# Patient Record
Sex: Female | Born: 1945 | Race: Black or African American | Hispanic: No | Smoking: Never smoker
Health system: Southern US, Community
[De-identification: ages and names within clinical notes are randomized; demographics above are authoritative.]

## PROBLEM LIST (undated history)

## (undated) DIAGNOSIS — F419 Anxiety disorder, unspecified: Secondary | ICD-10-CM

## (undated) DIAGNOSIS — K219 Gastro-esophageal reflux disease without esophagitis: Secondary | ICD-10-CM

## (undated) DIAGNOSIS — G629 Polyneuropathy, unspecified: Secondary | ICD-10-CM

## (undated) DIAGNOSIS — H269 Unspecified cataract: Secondary | ICD-10-CM

## (undated) DIAGNOSIS — I10 Essential (primary) hypertension: Secondary | ICD-10-CM

## (undated) DIAGNOSIS — D649 Anemia, unspecified: Secondary | ICD-10-CM

## (undated) DIAGNOSIS — M1990 Unspecified osteoarthritis, unspecified site: Secondary | ICD-10-CM

## (undated) HISTORY — DX: Gastro-esophageal reflux disease without esophagitis: K21.9

## (undated) HISTORY — DX: Unspecified osteoarthritis, unspecified site: M19.90

## (undated) HISTORY — DX: Unspecified cataract: H26.9

## (undated) HISTORY — DX: Anxiety disorder, unspecified: F41.9

## (undated) HISTORY — DX: Anemia, unspecified: D64.9

## (undated) HISTORY — DX: Polyneuropathy, unspecified: G62.9

---

## 1973-12-27 HISTORY — PX: CHOLECYSTECTOMY: SHX55

## 1992-12-27 HISTORY — PX: ABDOMINAL HYSTERECTOMY: SHX81

## 1998-07-28 ENCOUNTER — Encounter: Admission: RE | Admit: 1998-07-28 | Discharge: 1998-07-28 | Payer: Self-pay | Admitting: *Deleted

## 2000-02-12 ENCOUNTER — Encounter: Payer: Self-pay | Admitting: *Deleted

## 2000-02-12 ENCOUNTER — Ambulatory Visit (HOSPITAL_COMMUNITY): Admission: RE | Admit: 2000-02-12 | Discharge: 2000-02-12 | Payer: Self-pay | Admitting: *Deleted

## 2001-03-09 ENCOUNTER — Other Ambulatory Visit: Admission: RE | Admit: 2001-03-09 | Discharge: 2001-03-09 | Payer: Self-pay | Admitting: *Deleted

## 2001-03-29 ENCOUNTER — Encounter: Payer: Self-pay | Admitting: Family Medicine

## 2001-03-29 ENCOUNTER — Encounter: Admission: RE | Admit: 2001-03-29 | Discharge: 2001-03-29 | Payer: Self-pay | Admitting: Family Medicine

## 2002-03-05 ENCOUNTER — Encounter: Admission: RE | Admit: 2002-03-05 | Discharge: 2002-03-05 | Payer: Self-pay | Admitting: *Deleted

## 2002-05-22 ENCOUNTER — Other Ambulatory Visit: Admission: RE | Admit: 2002-05-22 | Discharge: 2002-05-22 | Payer: Self-pay | Admitting: *Deleted

## 2002-05-25 ENCOUNTER — Encounter: Payer: Self-pay | Admitting: *Deleted

## 2002-05-25 ENCOUNTER — Ambulatory Visit (HOSPITAL_COMMUNITY): Admission: RE | Admit: 2002-05-25 | Discharge: 2002-05-25 | Payer: Self-pay | Admitting: *Deleted

## 2002-05-30 ENCOUNTER — Encounter: Admission: RE | Admit: 2002-05-30 | Discharge: 2002-05-30 | Payer: Self-pay | Admitting: *Deleted

## 2002-05-30 ENCOUNTER — Encounter: Payer: Self-pay | Admitting: *Deleted

## 2003-01-02 ENCOUNTER — Encounter: Payer: Self-pay | Admitting: *Deleted

## 2003-01-02 ENCOUNTER — Encounter: Admission: RE | Admit: 2003-01-02 | Discharge: 2003-01-02 | Payer: Self-pay | Admitting: *Deleted

## 2003-07-10 ENCOUNTER — Other Ambulatory Visit: Admission: RE | Admit: 2003-07-10 | Discharge: 2003-07-10 | Payer: Self-pay | Admitting: *Deleted

## 2004-08-03 ENCOUNTER — Other Ambulatory Visit: Admission: RE | Admit: 2004-08-03 | Discharge: 2004-08-03 | Payer: Self-pay | Admitting: *Deleted

## 2004-08-16 ENCOUNTER — Emergency Department (HOSPITAL_COMMUNITY): Admission: EM | Admit: 2004-08-16 | Discharge: 2004-08-16 | Payer: Self-pay | Admitting: *Deleted

## 2004-08-17 ENCOUNTER — Encounter: Admission: RE | Admit: 2004-08-17 | Discharge: 2004-08-17 | Payer: Self-pay | Admitting: *Deleted

## 2005-09-01 ENCOUNTER — Other Ambulatory Visit: Admission: RE | Admit: 2005-09-01 | Discharge: 2005-09-01 | Payer: Self-pay | Admitting: *Deleted

## 2005-09-01 ENCOUNTER — Ambulatory Visit (HOSPITAL_COMMUNITY): Admission: RE | Admit: 2005-09-01 | Discharge: 2005-09-01 | Payer: Self-pay | Admitting: *Deleted

## 2005-10-01 ENCOUNTER — Ambulatory Visit: Payer: Self-pay | Admitting: Family Medicine

## 2005-10-05 ENCOUNTER — Encounter: Admission: RE | Admit: 2005-10-05 | Discharge: 2005-10-05 | Payer: Self-pay | Admitting: Family Medicine

## 2005-10-19 ENCOUNTER — Ambulatory Visit: Payer: Self-pay | Admitting: Gastroenterology

## 2005-11-03 ENCOUNTER — Ambulatory Visit: Payer: Self-pay | Admitting: Gastroenterology

## 2005-12-17 ENCOUNTER — Encounter: Admission: RE | Admit: 2005-12-17 | Discharge: 2005-12-17 | Payer: Self-pay | Admitting: Neurology

## 2006-10-13 ENCOUNTER — Ambulatory Visit (HOSPITAL_COMMUNITY): Admission: RE | Admit: 2006-10-13 | Discharge: 2006-10-13 | Payer: Self-pay | Admitting: *Deleted

## 2006-10-13 ENCOUNTER — Ambulatory Visit: Payer: Self-pay | Admitting: Family Medicine

## 2006-11-08 ENCOUNTER — Other Ambulatory Visit: Admission: RE | Admit: 2006-11-08 | Discharge: 2006-11-08 | Payer: Self-pay | Admitting: *Deleted

## 2007-09-25 DIAGNOSIS — D649 Anemia, unspecified: Secondary | ICD-10-CM | POA: Insufficient documentation

## 2007-10-10 ENCOUNTER — Ambulatory Visit (HOSPITAL_COMMUNITY): Admission: RE | Admit: 2007-10-10 | Discharge: 2007-10-10 | Payer: Self-pay | Admitting: *Deleted

## 2007-11-15 ENCOUNTER — Other Ambulatory Visit: Admission: RE | Admit: 2007-11-15 | Discharge: 2007-11-15 | Payer: Self-pay | Admitting: *Deleted

## 2008-01-17 DIAGNOSIS — M542 Cervicalgia: Secondary | ICD-10-CM | POA: Insufficient documentation

## 2008-01-24 ENCOUNTER — Ambulatory Visit: Payer: Self-pay | Admitting: Family Medicine

## 2008-10-14 ENCOUNTER — Encounter: Admission: RE | Admit: 2008-10-14 | Discharge: 2008-10-14 | Payer: Self-pay | Admitting: Gynecology

## 2008-12-09 ENCOUNTER — Ambulatory Visit: Payer: Self-pay | Admitting: Family Medicine

## 2008-12-09 DIAGNOSIS — R300 Dysuria: Secondary | ICD-10-CM | POA: Insufficient documentation

## 2008-12-09 LAB — CONVERTED CEMR LAB
Bilirubin Urine: NEGATIVE
Blood in Urine, dipstick: NEGATIVE
Glucose, Urine, Semiquant: NEGATIVE
Ketones, urine, test strip: NEGATIVE
Nitrite: NEGATIVE
Protein, U semiquant: NEGATIVE
Specific Gravity, Urine: 1.025
Urobilinogen, UA: 0.2
WBC Urine, dipstick: NEGATIVE
pH: 6

## 2008-12-12 ENCOUNTER — Telehealth: Payer: Self-pay | Admitting: *Deleted

## 2008-12-12 LAB — CONVERTED CEMR LAB
ALT: 20 units/L (ref 0–35)
AST: 27 units/L (ref 0–37)
Albumin: 3.9 g/dL (ref 3.5–5.2)
Alkaline Phosphatase: 81 units/L (ref 39–117)
BUN: 16 mg/dL (ref 6–23)
Basophils Absolute: 0.1 10*3/uL (ref 0.0–0.1)
Basophils Relative: 0.8 % (ref 0.0–3.0)
Bilirubin, Direct: 0.1 mg/dL (ref 0.0–0.3)
CO2: 31 meq/L (ref 19–32)
Calcium: 9.6 mg/dL (ref 8.4–10.5)
Chloride: 104 meq/L (ref 96–112)
Cholesterol: 202 mg/dL (ref 0–200)
Creatinine, Ser: 0.7 mg/dL (ref 0.4–1.2)
Direct LDL: 123.5 mg/dL
Eosinophils Absolute: 0 10*3/uL (ref 0.0–0.7)
Eosinophils Relative: 0.3 % (ref 0.0–5.0)
GFR calc Af Amer: 109 mL/min
GFR calc non Af Amer: 90 mL/min
Glucose, Bld: 54 mg/dL — ABNORMAL LOW (ref 70–99)
HCT: 33.2 % — ABNORMAL LOW (ref 36.0–46.0)
HDL: 66.9 mg/dL (ref >39.0)
Hemoglobin: 11.3 g/dL — ABNORMAL LOW (ref 12.0–15.0)
Lymphocytes Relative: 23.8 % (ref 12.0–46.0)
MCHC: 34.1 g/dL (ref 30.0–36.0)
MCV: 72 fL — ABNORMAL LOW (ref 78.0–100.0)
Monocytes Absolute: 0.3 10*3/uL (ref 0.1–1.0)
Monocytes Relative: 5.1 % (ref 3.0–12.0)
Neutro Abs: 4.8 10*3/uL (ref 1.4–7.7)
Neutrophils Relative %: 70 % (ref 43.0–77.0)
Platelets: 252 10*3/uL (ref 150–400)
Potassium: 3.5 meq/L (ref 3.5–5.1)
RBC: 4.61 M/uL (ref 3.87–5.11)
RDW: 14.4 % (ref 11.5–14.6)
Sodium: 142 meq/L (ref 135–145)
TSH: 0.87 microintl units/mL (ref 0.35–5.50)
Total Bilirubin: 0.8 mg/dL (ref 0.3–1.2)
Total CHOL/HDL Ratio: 3
Total Protein: 7.2 g/dL (ref 6.0–8.3)
Triglycerides: 41 mg/dL (ref 0–149)
VLDL: 8 mg/dL (ref 0–40)
WBC: 6.8 10*3/uL (ref 4.5–10.5)

## 2009-11-14 ENCOUNTER — Encounter: Admission: RE | Admit: 2009-11-14 | Discharge: 2009-11-14 | Payer: Self-pay | Admitting: Family Medicine

## 2009-12-10 ENCOUNTER — Ambulatory Visit: Payer: Self-pay | Admitting: Family Medicine

## 2009-12-10 LAB — CONVERTED CEMR LAB
ALT: 17 units/L (ref 0–35)
AST: 23 units/L (ref 0–37)
Albumin: 4 g/dL (ref 3.5–5.2)
Alkaline Phosphatase: 83 units/L (ref 39–117)
BUN: 6 mg/dL (ref 6–23)
Basophils Absolute: 0 10*3/uL (ref 0.0–0.1)
Basophils Relative: 0.5 % (ref 0.0–3.0)
Bilirubin Urine: NEGATIVE
Bilirubin, Direct: 0 mg/dL (ref 0.0–0.3)
Blood in Urine, dipstick: NEGATIVE
CO2: 30 meq/L (ref 19–32)
Calcium: 9.6 mg/dL (ref 8.4–10.5)
Chloride: 104 meq/L (ref 96–112)
Cholesterol: 186 mg/dL (ref 0–200)
Creatinine, Ser: 0.8 mg/dL (ref 0.4–1.2)
Eosinophils Absolute: 0 10*3/uL (ref 0.0–0.7)
Eosinophils Relative: 0.6 % (ref 0.0–5.0)
GFR calc non Af Amer: 93.47 mL/min (ref >60)
Glucose, Bld: 80 mg/dL (ref 70–99)
Glucose, Urine, Semiquant: NEGATIVE
HCT: 34.4 % — ABNORMAL LOW (ref 36.0–46.0)
HDL: 58.6 mg/dL (ref >39.00)
Hemoglobin: 11.3 g/dL — ABNORMAL LOW (ref 12.0–15.0)
Ketones, urine, test strip: NEGATIVE
LDL Cholesterol: 115 mg/dL — ABNORMAL HIGH (ref 0–99)
Lymphocytes Relative: 32.8 % (ref 12.0–46.0)
Lymphs Abs: 1.9 10*3/uL (ref 0.7–4.0)
MCHC: 33 g/dL (ref 30.0–36.0)
MCV: 75.3 fL — ABNORMAL LOW (ref 78.0–100.0)
Monocytes Absolute: 0.4 10*3/uL (ref 0.1–1.0)
Monocytes Relative: 7.7 % (ref 3.0–12.0)
Neutro Abs: 3.5 10*3/uL (ref 1.4–7.7)
Neutrophils Relative %: 58.4 % (ref 43.0–77.0)
Nitrite: NEGATIVE
Platelets: 241 10*3/uL (ref 150.0–400.0)
Potassium: 4.1 meq/L (ref 3.5–5.1)
Protein, U semiquant: NEGATIVE
RBC: 4.57 M/uL (ref 3.87–5.11)
RDW: 14.6 % (ref 11.5–14.6)
Sodium: 139 meq/L (ref 135–145)
Specific Gravity, Urine: 1.01
TSH: 1.63 microintl units/mL (ref 0.35–5.50)
Total Bilirubin: 0.9 mg/dL (ref 0.3–1.2)
Total CHOL/HDL Ratio: 3
Total Protein: 7.4 g/dL (ref 6.0–8.3)
Triglycerides: 61 mg/dL (ref 0.0–149.0)
Urobilinogen, UA: 0.2
VLDL: 12.2 mg/dL (ref 0.0–40.0)
WBC Urine, dipstick: NEGATIVE
WBC: 5.8 10*3/uL (ref 4.5–10.5)
pH: 6

## 2009-12-17 ENCOUNTER — Ambulatory Visit: Payer: Self-pay | Admitting: Family Medicine

## 2010-04-27 ENCOUNTER — Ambulatory Visit: Payer: Self-pay | Admitting: Family Medicine

## 2010-04-27 DIAGNOSIS — H908 Mixed conductive and sensorineural hearing loss, unspecified: Secondary | ICD-10-CM | POA: Insufficient documentation

## 2010-05-18 ENCOUNTER — Ambulatory Visit: Payer: Self-pay | Admitting: Family Medicine

## 2010-06-03 ENCOUNTER — Telehealth: Payer: Self-pay | Admitting: Family Medicine

## 2010-06-18 ENCOUNTER — Telehealth: Payer: Self-pay | Admitting: Family Medicine

## 2010-06-18 ENCOUNTER — Encounter: Payer: Self-pay | Admitting: Family Medicine

## 2010-07-02 ENCOUNTER — Telehealth: Payer: Self-pay | Admitting: Family Medicine

## 2010-07-14 ENCOUNTER — Encounter: Admission: RE | Admit: 2010-07-14 | Discharge: 2010-07-14 | Payer: Self-pay | Admitting: Neurosurgery

## 2010-11-25 ENCOUNTER — Encounter: Admission: RE | Admit: 2010-11-25 | Discharge: 2010-11-25 | Payer: Self-pay | Admitting: Family Medicine

## 2010-12-10 ENCOUNTER — Ambulatory Visit: Payer: Self-pay | Admitting: Family Medicine

## 2010-12-10 LAB — CONVERTED CEMR LAB
ALT: 17 units/L (ref 0–35)
AST: 23 units/L (ref 0–37)
Albumin: 4.2 g/dL (ref 3.5–5.2)
Alkaline Phosphatase: 82 units/L (ref 39–117)
BUN: 15 mg/dL (ref 6–23)
Basophils Absolute: 0 10*3/uL (ref 0.0–0.1)
Basophils Relative: 0.5 % (ref 0.0–3.0)
Bilirubin Urine: NEGATIVE
Bilirubin, Direct: 0.1 mg/dL (ref 0.0–0.3)
Blood in Urine, dipstick: NEGATIVE
CO2: 29 meq/L (ref 19–32)
Calcium: 9.6 mg/dL (ref 8.4–10.5)
Chloride: 102 meq/L (ref 96–112)
Cholesterol: 213 mg/dL — ABNORMAL HIGH (ref 0–200)
Creatinine, Ser: 0.7 mg/dL (ref 0.4–1.2)
Direct LDL: 139.4 mg/dL
Eosinophils Absolute: 0 10*3/uL (ref 0.0–0.7)
Eosinophils Relative: 0.5 % (ref 0.0–5.0)
GFR calc non Af Amer: 110.5 mL/min (ref >60.00)
Glucose, Bld: 77 mg/dL (ref 70–99)
Glucose, Urine, Semiquant: NEGATIVE
HCT: 33.7 % — ABNORMAL LOW (ref 36.0–46.0)
HDL: 59.3 mg/dL (ref >39.00)
Hemoglobin: 11 g/dL — ABNORMAL LOW (ref 12.0–15.0)
Ketones, urine, test strip: NEGATIVE
Lymphocytes Relative: 34.2 % (ref 12.0–46.0)
Lymphs Abs: 1.8 10*3/uL (ref 0.7–4.0)
MCHC: 32.6 g/dL (ref 30.0–36.0)
MCV: 73.9 fL — ABNORMAL LOW (ref 78.0–100.0)
Monocytes Absolute: 0.3 10*3/uL (ref 0.1–1.0)
Monocytes Relative: 6.1 % (ref 3.0–12.0)
Neutro Abs: 3.1 10*3/uL (ref 1.4–7.7)
Neutrophils Relative %: 58.7 % (ref 43.0–77.0)
Nitrite: NEGATIVE
Platelets: 256 10*3/uL (ref 150.0–400.0)
Potassium: 3.8 meq/L (ref 3.5–5.1)
Protein, U semiquant: NEGATIVE
RBC: 4.57 M/uL (ref 3.87–5.11)
RDW: 16 % — ABNORMAL HIGH (ref 11.5–14.6)
Sodium: 140 meq/L (ref 135–145)
Specific Gravity, Urine: 1.025
TSH: 1.74 microintl units/mL (ref 0.35–5.50)
Total Bilirubin: 0.9 mg/dL (ref 0.3–1.2)
Total CHOL/HDL Ratio: 4
Total Protein: 7.3 g/dL (ref 6.0–8.3)
Triglycerides: 43 mg/dL (ref 0.0–149.0)
Urobilinogen, UA: 0.2
VLDL: 8.6 mg/dL (ref 0.0–40.0)
WBC Urine, dipstick: NEGATIVE
WBC: 5.3 10*3/uL (ref 4.5–10.5)
pH: 5.5

## 2010-12-16 ENCOUNTER — Encounter: Payer: Self-pay | Admitting: Family Medicine

## 2010-12-17 ENCOUNTER — Ambulatory Visit: Payer: Self-pay | Admitting: Family Medicine

## 2011-01-26 NOTE — Miscellaneous (Signed)
Summary: Orders Update  Clinical Lists Changes  Orders: Added new Referral order of Neurosurgeon Referral (Neurosurgeon) - Signed 

## 2011-01-26 NOTE — Assessment & Plan Note (Signed)
Summary: NECK/EAR PAIN/CCM   Vital Signs:  Patient Profile:   65 Years Old Female Height:     62 inches (157.48 cm) Weight:      129 pounds (58.64 kg) BMI:     23.68 Temp:     98.2 degrees F (36.78 degrees C) oral Pulse rate:   69 / minute BP sitting:   148 / 82  (left arm)  Pt. in pain?   yes    Location:   head/neck    Intensity:   10  Vitals Entered By: Arcola Jansky, RN (January 24, 2008 11:34 AM)                  Chief Complaint:  neck and ear pain, "cold cough, ear sore, and sore throat-during Christmas;now having severe shoulder & neck pain that radiates to arm".  History of Present Illness: Deborah Barton is a 65 year old female, who comes in today for evaluation and of two problems.  Problem one.  A week ago unprovoked she developed pain in the left side of her neck.  She points to the left posterior cervical muscles as the source for her pain.  She says the discomfort is up in her head and down to her shoulder.  She denies any history of trauma.  She describes the pain as, constant, it's dull on a scale of one to 10 and 6.  She has no trouble with her vision or hearing.  No nausea, vomiting, diarrhea, in the neurologic symptoms.  Again, she denies any history of trauma.  Second problem is left earache.  She had a cold two weeks ago.  It resolved, but she still has pain in her left ear.  She says the pain comes and goes.  Acute Visit History:      She denies abdominal pain, chest pain, constipation, cough, diarrhea, earache, eye symptoms, fever, genitourinary symptoms, headache, musculoskeletal symptoms, nasal discharge, nausea, rash, sinus problems, sore throat, and vomiting.         Current Allergies (reviewed today): No known allergies   Past Medical History:    Reviewed history from 09/25/2007 and no changes required:       Anemia-NOS       childbirth x 2       TAH/BSO       cholecystectomy   Family History:    Reviewed history from 09/25/2007 and  no changes required:       Family History of Anemia/FE deficiency       Family History of Arthritis       Family History of Asthma       Family History Depression       Family History Hypertension       Family History of Stroke M 1st degree relative <50  Social History:    Reviewed history from 09/25/2007 and no changes required:       Occupation: retail       Divorced       Never Smoked       Alcohol use-no    Review of Systems      See HPI   Physical Exam  General:     Well-developed,well-nourished,in no acute distress; alert,appropriate and cooperative throughout examination Head:     Normocephalic and atraumatic without obvious abnormalities. No apparent alopecia or balding. Eyes:     No corneal or conjunctival inflammation noted. EOMI. Perrla. Funduscopic exam benign, without hemorrhages, exudates or papilledema. Vision grossly normal. Ears:  External ear exam shows no significant lesions or deformities.  Otoscopic examination reveals clear canals, tympanic membranes are intact bilaterally without bulging, retraction, inflammation or discharge. Hearing is grossly normal bilaterally. Nose:     External nasal examination shows no deformity or inflammation. Nasal mucosa are pink and moist without lesions or exudates. Mouth:     Oral mucosa and oropharynx without lesions or exudates.  Teeth in good repair. Neck:     No deformities, masses, or tenderness noted. Msk:     No deformity or scoliosis noted of thoracic or lumbar spine.   Pulses:     R and L carotid,radial,femoral,dorsalis pedis and posterior tibial pulses are full and equal bilaterally Extremities:     No clubbing, cyanosis, edema, or deformity noted with normal full range of motion of all joints.   Neurologic:     No cranial nerve deficits noted. Station and gait are normal. Plantar reflexes are down-going bilaterally. DTRs are symmetrical throughout. Sensory, motor and coordinative functions appear  intact.    Impression & Recommendations:  Problem # 1:  NECK PAIN, LEFT (ICD-723.1) Assessment: New  Her updated medication list for this problem includes:    Flexeril 10 Mg Tabs (Cyclobenzaprine hcl) .Marland Kitchen... 1 tab @ bedtime    Vicodin Es 7.5-750 Mg Tabs (Hydrocodone-acetaminophen) .Marland Kitchen... 1 tab @ bedtime   Complete Medication List: 1)  Flexeril 10 Mg Tabs (Cyclobenzaprine hcl) .Marland Kitchen.. 1 tab @ bedtime 2)  Vicodin Es 7.5-750 Mg Tabs (Hydrocodone-acetaminophen) .Marland Kitchen.. 1 tab @ bedtime   Patient Instructions: 1)  begin Motrin 600 mg 3 times a day with food.  I will write her a prescription for Flexeril one half tablet at bedtime, along with a half a tablet of Vicodin as needed for severe nighttime pain.  Go to the surgical supply, place, and purchase a soft cervical collar.  But the collar on at that time and wear it all night.  If your symptoms do not resolve in two to 3 weeks let us know.  We will get her set up for physical therapy. 2)  Please set up for a physical exam this winter. 3)  Please return for lab work one (1) week before your next appointment.  CBC, Bmet, lipid panel, urinalysis, TSH level, hepatic panel, prior to next visit (v70.0)     Prescriptions: VICODIN ES 7.5-750 MG  TABS (HYDROCODONE-ACETAMINOPHEN) 1 tab @ bedtime  #30 x 1   Entered and Authorized by:   Roderick Pee MD   Signed by:   Roderick Pee MD on 01/24/2008   Method used:   Print then Give to Patient   RxID:   [id number] FLEXERIL 10 MG  TABS (CYCLOBENZAPRINE HCL) 1 tab @ bedtime  #30 x 1   Entered and Authorized by:   Roderick Pee MD   Signed by:   Roderick Pee MD on 01/24/2008   Method used:   Print then Give to Patient   RxID:   [id number]  ]

## 2011-01-26 NOTE — Assessment & Plan Note (Signed)
Summary: cpx patient fasting/mhf   Vital Signs:  Patient Profile:   65 Years Old Female Height:     62 inches (157.48 cm) Weight:      129 pounds Temp:     98.3 degrees F oral BP sitting:   140 / 90  (left arm) Cuff size:   regular  Vitals Entered By: Kern Reap CMA (December 09, 2008 10:43 AM)                 Chief Complaint:  cpx.  History of Present Illness: Deborah Barton is a 65 year old female, who comes in today for evaluation of multiple issues.  She was last seen here 2006 at that time.  She was complaining of pain in the left side of her head and numbness in her left arm and leg.  She underwent a complete evaluation by me which was negative.  She then went to neurology and had a complete workup.  MRI brain scan was normal.  Neurologic evaluation was normal.  Since that, time.  She's continued to have symp.  She states she takes 4 mg of Motrin at bedtime, and this helps alleviate her symptoms.  Neurologic review of systems negative.  She is postmenopausal.  She is up on health maintenance.  Activities.  Last tetanus shot unknown.  Will give her a booster today.  Also, recommended both flu shots, which she declines.  She also complains of some low back pain and dysuria.  However.  Urine is normal.  Two uterus and ovaries removed for nonmalignant reasons  colonoscopy o 6, normal.    Current Allergies: No known allergies   Past Medical History:    Reviewed history from 01/24/2008 and no changes required:       Anemia-NOS       childbirth x 2       TAH/BSO       cholecystectomy       intermittent numbness left side of face, arm, and leg, etiology unknown   Family History:    Reviewed history from 09/25/2007 and no changes required:       Family History of Anemia/FE deficiency       Family History of Arthritis       Family History of Asthma       Family History Depression       Family History Hypertension       Family History of Stroke M 1st degree relative  <50  Social History:    Reviewed history from 01/24/2008 and no changes required:       Occupation: retail       Divorced       Never Smoked       Alcohol use-no    Review of Systems      See HPI   Physical Exam  General:     Well-developed,well-nourished,in no acute distress; alert,appropriate and cooperative throughout examination Head:     Normocephalic and atraumatic without obvious abnormalities. No apparent alopecia or balding. Eyes:     No corneal or conjunctival inflammation noted. EOMI. Perrla. Funduscopic exam benign, without hemorrhages, exudates or papilledema. Vision grossly normal. Ears:     External ear exam shows no significant lesions or deformities.  Otoscopic examination reveals clear canals, tympanic membranes are intact bilaterally without bulging, retraction, inflammation or discharge. Hearing is grossly normal bilaterally. Nose:     External nasal examination shows no deformity or inflammation. Nasal mucosa are pink and moist without lesions or exudates. Mouth:  Oral mucosa and oropharynx without lesions or exudates.  Teeth in good repair. Neck:     No deformities, masses, or tenderness noted. Chest Wall:     No deformities, masses, or tenderness noted. Breasts:     No mass, nodules, thickening, tenderness, bulging, retraction, inflamation, nipple discharge or skin changes noted.   Lungs:     Normal respiratory effort, chest expands symmetrically. Lungs are clear to auscultation, no crackles or wheezes. Heart:     Normal rate and regular rhythm. S1 and S2 normal without gallop, murmur, click, rub or other extra sounds. Abdomen:     Bowel sounds positive,abdomen soft and non-tender without masses, organomegaly or hernias noted. Rectal:     No external abnormalities noted. Normal sphincter tone. No rectal masses or tenderness. Genitalia:     Pelvic Exam:        External: normal female genitalia without lesions or masses        Vagina: normal  without lesions or masses        Cervix: normal without lesions or masses        Adnexa: normal bimanual exam without masses or fullness  dry vagina        Uterus: normal by palpation        Pap smear: not performed Msk:     No deformity or scoliosis noted of thoracic or lumbar spine.   Pulses:     R and L carotid,radial,femoral,dorsalis pedis and posterior tibial pulses are full and equal bilaterally Extremities:     No clubbing, cyanosis, edema, or deformity noted with normal full range of motion of all joints.   Neurologic:     No cranial nerve deficits noted. Station and gait are normal. Plantar reflexes are down-going bilaterally. DTRs are symmetrical throughout. Sensory, motor and coordinative functions appear intact. Skin:     Intact without suspicious lesions or rashes Cervical Nodes:     No lymphadenopathy noted Axillary Nodes:     No palpable lymphadenopathy Inguinal Nodes:     No significant adenopathy Psych:     Cognition and judgment appear intact. Alert and cooperative with normal attention span and concentration. No apparent delusions, illusions, hallucinations    Impression & Recommendations:  Problem # 1:  NECK PAIN, LEFT (ICD-723.1) Assessment: Unchanged  Her updated medication list for this problem includes:    Flexeril 10 Mg Tabs (Cyclobenzaprine hcl) .Marland Kitchen... 1 tab @ bedtime    Vicodin Es 7.5-750 Mg Tabs (Hydrocodone-acetaminophen) .Marland Kitchen... 1 tab @ bedtime    Ibuprofen 200 Mg Tabs (Ibuprofen) .Marland Kitchen... Take one once daily  Orders: Venipuncture (16109) TLB-Lipid Panel (80061-LIPID) TLB-BMP (Basic Metabolic Panel-BMET) (80048-METABOL) TLB-CBC Platelet - w/Differential (85025-CBCD) TLB-Hepatic/Liver Function Pnl (80076-HEPATIC) TLB-TSH (Thyroid Stimulating Hormone) (84443-TSH)   Problem # 2:  DYSURIA (ICD-788.1) Assessment: New  Her updated medication list for this problem includes:    Metronidazole 500 Mg Tabs (Metronidazole) .Marland Kitchen... Take one tablet at  bedtime  Orders: UA Dipstick w/o Micro (manual) (60454) Venipuncture (09811) TLB-Lipid Panel (80061-LIPID) TLB-BMP (Basic Metabolic Panel-BMET) (80048-METABOL) TLB-CBC Platelet - w/Differential (85025-CBCD) TLB-Hepatic/Liver Function Pnl (80076-HEPATIC) TLB-TSH (Thyroid Stimulating Hormone) (84443-TSH)   Complete Medication List: 1)  Flexeril 10 Mg Tabs (Cyclobenzaprine hcl) .Marland Kitchen.. 1 tab @ bedtime 2)  Vicodin Es 7.5-750 Mg Tabs (Hydrocodone-acetaminophen) .Marland Kitchen.. 1 tab @ bedtime 3)  Ibuprofen 200 Mg Tabs (Ibuprofen) .... Take one once daily 4)  Metronidazole 500 Mg Tabs (Metronidazole) .... Take one tablet at bedtime 5)  Lidex 0.05 % Crea (Fluocinonide) .... Apply to  area 3 times once daily as needed 6)  Premarin 0.625 Mg/gm Crea (Estrogens, conjugated) .... Apply 2 x week  Other Orders: EKG w/ Interpretation (93000) Tdap => 16yrs IM (16109) Admin 1st Vaccine (60454)   Patient Instructions: 1)  Please schedule a follow-up appointment in 1 year. 2)  It is important that you exercise regularly at least 20 minutes 5 times a week. If you develop chest pain, have severe difficulty breathing, or feel very tired , stop exercising immediately and seek medical attention. 3)  Schedule your mammogram. 4)  Schedule a colonoscopy/sigmoidoscopy to help detect colon cancer. 5)  Take an Aspirin every day.   Prescriptions: PREMARIN 0.625 MG/GM CREA (ESTROGENS, CONJUGATED) apply 2 x week  #3 tubes x 10   Entered and Authorized by:   Roderick Pee MD   Signed by:   Roderick Pee MD on 12/09/2008   Method used:   Print then Give to Patient   RxID:   [id number]  ]  Tetanus/Td Vaccine    Vaccine Type: Tdap    Site: right deltoid    Mfr: Sanofi Pasteur    Dose: 0.5 ml    Route: IM    Given by: Kern Reap CMA    Exp. Date: 11/28/2010    Lot #: [id number]   Laboratory Results   Urine Tests   Date/Time Reported: December 09, 2008 10:59 AM   Routine Urinalysis   Color:  yellow Appearance: Clear Glucose: negative   (Normal Range: Negative) Bilirubin: negative   (Normal Range: Negative) Ketone: negative   (Normal Range: Negative) Spec. Gravity: 1.025   (Normal Range: 1.003-1.035) Blood: negative   (Normal Range: Negative) pH: 6.0   (Normal Range: 5.0-8.0) Protein: negative   (Normal Range: Negative) Urobilinogen: 0.2   (Normal Range: 0-1) Nitrite: negative   (Normal Range: Negative) Leukocyte Esterace: negative   (Normal Range: Negative)    Comments: Kern Reap CMA  December 09, 2008 11:00 AM

## 2011-01-26 NOTE — Progress Notes (Signed)
Summary: test results  Phone Note Call from Patient Call back at [phone]   Caller: vm Call For: Deborah Barton Summary of Call: Returning your call about my test results. Initial call taken by: Rudy Jew, RN,  December 12, 2008 9:25 AM  Follow-up for Phone Call        labs normal Follow-up by: Roderick Pee MD,  December 12, 2008 12:21 PM  Additional Follow-up for Phone Call Additional follow up Details #1::        already spoke with pt Additional Follow-up by: Kern Reap CMA,  December 12, 2008 12:46 PM
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
## 2011-01-26 NOTE — Assessment & Plan Note (Signed)
Summary: ear pain/dm   Vital Signs:  Patient profile:   65 year old female Menstrual status:  hysterectomy Temp:     98.0 degrees F oral BP sitting:   124 / 80  (left arm) Cuff size:   regular  Vitals Entered By: Kern Reap CMA Duncan Dull) (Apr 27, 2010 3:32 PM) CC: left ear pain, head and neck pain   CC:  left ear pain and head and neck pain.  History of Present Illness: Deborah Barton is a 65 year old female comes in today for evaluation of two problems.  She's been using ear wax dissolving drops to get the ear wax out of her left ear canal, but can't get the wax out.  Also, she said left-sided neck pain for about two weeks.  No history of trauma.  She describes her neck pain is constant, dull, a 3 or 4 on a scale of one to 10.  No radiation.  No history of trauma.  Neurologic review of systems negative  Allergies: No Known Drug Allergies  Past History:  Past medical, surgical, family and social histories (including risk factors) reviewed for relevance to current acute and chronic problems.  Past Medical History: Reviewed history from 12/09/2008 and no changes required. Anemia-NOS childbirth x 2 TAH/BSO cholecystectomy intermittent numbness left side of face, arm, and leg, etiology unknown  Past Surgical History: Reviewed history from 09/25/2007 and no changes required. CB x2 TAH/BSO Cholecystectomy  Family History: Reviewed history from 09/25/2007 and no changes required. Family History of Anemia/FE deficiency Family History of Arthritis Family History of Asthma Family History Depression Family History Hypertension Family History of Stroke M 1st degree relative <50  Social History: Reviewed history from 01/24/2008 and no changes required. 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 No apparent alopecia or balding. Eyes:  No corneal or conjunctival inflammation noted. EOMI. Perrla. Funduscopic exam benign, without hemorrhages, exudates or papilledema. Vision grossly normal. Ears:  right ear canal normal left ear canal shows cerumen impaction relieved by suction by me Msk:  No deformity or scoliosis noted of thoracic or lumbar spine.   Pulses:  R and L carotid,radial,femoral,dorsalis pedis and posterior tibial pulses are full and equal bilaterally Extremities:  No clubbing, cyanosis, edema, or deformity noted with normal full range of motion of all joints.   Neurologic:  No cranial nerve deficits noted. Station and gait are normal. Plantar reflexes are down-going bilaterally. DTRs are symmetrical throughout. Sensory, motor and coordinative functions appear intact.   Impression & Recommendations:  Problem # 1:  NECK PAIN, LEFT (ICD-723.1) Assessment Deteriorated  Her updated medication list for this problem includes:    Flexeril 10 Mg Tabs (Cyclobenzaprine hcl) .Marland Kitchen... 1 tab @ bedtime    Vicodin Es 7.5-750 Mg Tabs (Hydrocodone-acetaminophen) .Marland Kitchen... 1 tab @ bedtime    Ibuprofen 200 Mg Tabs (Ibuprofen) .Marland Kitchen... 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 1 tab @ bedtime 2)  Vicodin Es 7.5-750 Mg Tabs (Hydrocodone-acetaminophen) .Marland Kitchen.. 1 tab @ bedtime 3)  Ibuprofen 200 Mg Tabs (Ibuprofen) .... Take one once daily 4)  Lidex 0.05 % Crea (Fluocinonide) .... Apply to area 3 times once daily as needed 5)  Premarin 0.625 Mg/gm  Crea (Estrogens, conjugated) .... Apply 2 x week  Patient Instructions: 1)  take 600 mg of Motrin 3 times a day with food for the soreness in her neck.  Return p.r.n.

## 2011-01-26 NOTE — Progress Notes (Signed)
Summary: MRI neck and left shoulder request  Phone Note Call from Patient Call back at [phone]   Caller: Patient Call For: Roderick Pee MD Summary of Call: Pt requesting MRI of her left side neck/shoulder.  PT is not helping and she is still in alot of pain.  Follow-up for Phone Call        [place]......... please call....... if the treatment program is not working, then we need to get her set up for a neurosurgical consult ASAP Follow-up by: Roderick Pee MD,  June 18, 2010 10:08 AM  Additional Follow-up for Phone Call Additional follow up Details #1::        Phone Call Completed Additional Follow-up by: Kern Reap CMA Duncan Dull),  June 18, 2010 11:00 AM

## 2011-01-26 NOTE — Progress Notes (Signed)
  Phone Note Call from Patient   Caller: Patient Call For: Roderick Pee MD Summary of Call: Pt would [facility] Ongoing neck pain and is going to PT. [phone] Initial call taken by: Lynann Beaver CMA,  June 03, 2010 10:08 AM  Follow-up for Phone Call        ok.........Marland KitchenFlexeril 5 mg, dispensed 30 tablets, directions one nightly p.r.n. refills x 1 Follow-up by: Roderick Pee MD,  June 03, 2010 10:24 AM    New/Updated Medications: FLEXERIL 5 MG TABS (CYCLOBENZAPRINE HCL) one by mouth q hs Prescriptions: FLEXERIL 5 MG TABS (CYCLOBENZAPRINE HCL) one by mouth q hs  #30 x 1   Entered by:   Lynann Beaver CMA   Authorized by:   Roderick Pee MD   Signed by:   Lynann Beaver CMA on 06/03/2010   Method used:   [facility]        [facility]  [address] [id number]       [address]       [place], [place]  [address]       Ph: [phone] or [phone]       Fax: [phone]   RxID:   [id number]

## 2011-01-26 NOTE — Assessment & Plan Note (Signed)
Summary: L SIDED HEAD DISCOMFORT / F/U FROM EAR IRRIGATION // RS   Vital Signs:  Patient profile:   65 year old female Menstrual status:  hysterectomy Weight:      131 pounds Temp:     98.4 degrees F oral BP sitting:   120 / 80  (left arm) Cuff size:   regular  Vitals Entered By: Kern Reap CMA Duncan Dull) (May 18, 2010 12:08 PM) CC: left side of neck pain Is Patient Diabetic? No Pain Assessment Patient in pain? yes        CC:  left side of neck pain.  History of Present Illness: Deborah Barton is a 65 year old female, who comes in today for evaluation of pain in the left side of her neck x 4 weeks.  We saw her a couple weeks ago with this problem.  Neurologic exam was negative.  We recommended Motrin 600 mg b.i.d..  She's done that, but the discomfort will not go away.  She describes as dull, constant, a 4 on a scale of one to 10.  It does not radiate.  It is confined to the left side of her neck.  Neurologic review of systems negative.  Allergies: No Known Drug Allergies  Review of Systems      See HPI  Physical Exam  General:  Well-developed,well-nourished,in no acute distress; alert,appropriate and cooperative throughout examination Head:  Normocephalic and atraumatic without obvious abnormalities. No apparent alopecia or balding. Eyes:  No corneal or conjunctival inflammation noted. EOMI. Perrla. Funduscopic exam benign, without hemorrhages, exudates or papilledema. Vision grossly normal. Ears:  External ear exam shows no significant lesions or deformities.  Otoscopic examination reveals clear canals, tympanic membranes are intact bilaterally without bulging, retraction, inflammation or discharge. Hearing is grossly normal bilaterally. Nose:  External nasal examination shows no deformity or inflammation. Nasal mucosa are pink and moist without lesions or exudates. Mouth:  Oral mucosa and oropharynx without lesions or exudates.  Teeth in good repair. Neck:  No deformities,  masses, or tenderness noted. Msk:  No deformity or scoliosis noted of thoracic or lumbar spine.   Pulses:  R and L carotid,radial,femoral,dorsalis pedis and posterior tibial pulses are full and equal bilaterally Extremities:  No clubbing, cyanosis, edema, or deformity noted with normal full range of motion of all joints.   Neurologic:  No cranial nerve deficits noted. Station and gait are normal. Plantar reflexes are down-going bilaterally. DTRs are symmetrical throughout. Sensory, motor and coordinative functions appear intact.   Impression & Recommendations:  Problem # 1:  NECK PAIN, LEFT (ICD-723.1) Assessment Unchanged  Her updated medication list for this problem includes:    Flexeril 10 Mg Tabs (Cyclobenzaprine hcl) .Marland Kitchen... 1 tab @ bedtime    Vicodin Es 7.5-750 Mg Tabs (Hydrocodone-acetaminophen) .Marland Kitchen... 1 tab @ bedtime    Ibuprofen 200 Mg Tabs (Ibuprofen) .Marland Kitchen... Take one once daily  Orders: Physical Therapy Referral (PT)  Complete Medication List: 1)  Flexeril 10 Mg Tabs (Cyclobenzaprine hcl) .Marland Kitchen.. 1 tab @ bedtime 2)  Vicodin Es 7.5-750 Mg Tabs (Hydrocodone-acetaminophen) .Marland Kitchen.. 1 tab @ bedtime 3)  Ibuprofen 200 Mg Tabs (Ibuprofen) .... Take one once daily 4)  Lidex 0.05 % Crea (Fluocinonide) .... Apply to area 3 times once daily as needed 5)  Premarin 0.625 Mg/gm Crea (Estrogens, conjugated) .... Apply 2 x week  Patient Instructions: 1)  take 600 mg of Motrin twice daily with food. 2)  We will get two sets of physical therapy to help relieve the pain. 3)  Return  p.r.n.

## 2011-01-26 NOTE — Progress Notes (Signed)
Summary: Flexeril refill  Phone Note Call from Patient   Caller: Patient Call For: Roderick Pee MD Summary of Call: Pt is [facility]  Has appt with neurosurgeon 07/15. [phone] Initial call taken by: Lynann Beaver CMA,  July 02, 2010 1:39 PM  Follow-up for Phone Call        okayed refill x 10 days.......... subsequent refills by neurosurgeon Follow-up by: Roderick Pee MD,  July 02, 2010 1:52 PM    Prescriptions: FLEXERIL 5 MG TABS (CYCLOBENZAPRINE HCL) one by mouth q hs  #10 x 0   Entered by:   Lynann Beaver CMA   Authorized by:   Roderick Pee MD   Signed by:   Lynann Beaver CMA on 07/02/2010   Method used:   [facility]        [facility]  [address] [id number]       [address]       [place], [place]  [address]       Ph: [phone] or [phone]       Fax: [phone]   RxID:   [id number]

## 2011-01-28 NOTE — Assessment & Plan Note (Signed)
Summary: cpx/njr/pt rescd from [facility]-per rachel//ccm   Vital Signs:  Patient profile:   65 year old female Menstrual status:  hysterectomy Height:      61.5 inches Weight:      130 pounds BMI:     24.25 Temp:     99.2 degrees F oral BP sitting:   130 / 80  (left arm) Cuff size:   regular  Vitals Entered By: Kern Reap CMA Duncan Dull) (December 16, 2010 11:14 AM) CC: cpx   CC:  cpx.  History of Present Illness: Deborah Barton is a 65 year old female, nonsmoker, who comes in today for general physical examination  She set a history of anemia.  We worked this up about 10 years ago.  All studies were negative, including colonoscopy.  Her hemoglobin is around 11.  She gets routine eye care, dental care, BSE monthly, annual mammography, colonoscopy, 2001.  Normal, tetanus, 2009, seasonal flu shot today.  Allergies (verified): No Known Drug Allergies  Past History:  Past medical, surgical, family and social histories (including risk factors) reviewed for relevance to current acute and chronic problems.  Past Medical History: Reviewed history from 12/09/2008 and no changes required. Anemia-NOS childbirth x 2 TAH/BSO cholecystectomy intermittent numbness left side of face, arm, and leg, etiology unknown  Past Surgical History: Reviewed history from 09/25/2007 and no changes required. CB x2 TAH/BSO Cholecystectomy  Family History: Reviewed history from 09/25/2007 and no changes required. Family History of Anemia/FE deficiency Family History of Arthritis Family History of Asthma Family History Depression Family History Hypertension Family History of Stroke M 1st degree relative <50  Social History: Reviewed history from 01/24/2008 and no changes required. 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 No apparent alopecia or balding. Eyes:  No corneal or conjunctival inflammation noted. EOMI. Perrla. Funduscopic exam benign, without hemorrhages, exudates or papilledema. Vision grossly normal. Ears:  External ear exam shows no significant lesions or deformities.  Otoscopic examination reveals clear canals, tympanic membranes are intact bilaterally without bulging, retraction, inflammation or discharge. Hearing is grossly normal bilaterally. Nose:  External nasal examination shows no deformity or inflammation. Nasal mucosa are pink and moist without lesions or exudates. Mouth:  Oral mucosa and oropharynx without lesions or exudates.  Teeth in good repair. Neck:  No deformities, masses, or tenderness noted. Chest Wall:  No deformities, masses, or tenderness noted. Breasts:  No mass, nodules, thickening, tenderness, bulging, retraction, inflamation, nipple discharge or skin changes noted.   Lungs:  Normal respiratory effort, chest expands symmetrically. Lungs are clear to auscultation, no crackles or wheezes. Heart:  Normal rate and regular rhythm. S1 and S2 normal without gallop, murmur, click, rub or other extra sounds. Abdomen:  Bowel sounds positive,abdomen soft and non-tender without masses, organomegaly or hernias noted. Rectal:  No external abnormalities noted. Normal sphincter tone. No rectal masses or tenderness. Genitalia:  Pelvic Exam:        External: normal female genitalia without lesions or masses        Vagina: normal without lesions or masses        Cervix: normal without lesions or masses        Adnexa: normal bimanual exam without masses or fullness        Uterus: normal by palpation        Pap smear:  not performed Msk:  No deformity or scoliosis noted of thoracic or lumbar spine.   Pulses:  R and L carotid,radial,femoral,dorsalis pedis and posterior tibial pulses are full and equal  bilaterally Extremities:  No clubbing, cyanosis, edema, or deformity noted with normal full range of motion of all joints.   Neurologic:  No cranial nerve deficits noted. Station and gait are normal. Plantar reflexes are down-going bilaterally. DTRs are symmetrical throughout. Sensory, motor and coordinative functions appear intact. Skin:  Intact without suspicious lesions or rashes Cervical Nodes:  No lymphadenopathy noted Axillary Nodes:  No palpable lymphadenopathy Inguinal Nodes:  No significant adenopathy Psych:  Cognition and judgment appear intact. Alert and cooperative with normal attention span and concentration. No apparent delusions, illusions, hallucinations   Impression & Recommendations:  Problem # 1:  ROUTINE GENERAL MEDICAL EXAM@HEALTH  CARE FACL (ICD-V70.0) Assessment Unchanged  Orders: Prescription Created Electronically [phone])  Problem # 2:  ANEMIA-NOS (ICD-285.9) Assessment: Unchanged  Orders: Prescription Created Electronically [phone])  Complete Medication List: 1)  Ibuprofen 200 Mg Tabs (Ibuprofen) .... Take one once daily 2)  Lidex 0.05 % Crea (Fluocinonide) .... Apply to area 3 times once daily as needed 3)  Premarin 0.625 Mg/gm Crea (Estrogens, conjugated) .... Apply 2 x week  Patient Instructions: 1)  It is important that you exercise regularly at least 20 minutes 5 times a week. If you develop chest pain, have severe difficulty breathing, or feel very tired , stop exercising immediately and seek medical attention. 2)  Schedule your mammogram. 3)  Schedule a colonoscopy/sigmoidoscopy to help detect colon cancer. 4)  Take calcium +Vitamin D daily. 5)  Take an Aspirin every day. Prescriptions: PREMARIN 0.625 MG/GM CREA (ESTROGENS, CONJUGATED) apply 2 x week  #1 x 6   Entered and Authorized by:   Roderick Pee MD   Signed by:   Roderick Pee MD on 12/16/2010   Method used:   [facility]  [address] [id number]       [address]       [place], [place]  [address]       Ph: [phone] or [phone]       Fax: [phone]   RxID:   [id number]    Orders Added: 1)  Prescription Created Electronically [phone])  Est. Patient 40-64 years [[id number]]      Preventive Care Screening  Colonoscopy:    Date:  12/28/1999    Results:  normal    Appended Document: Orders Update    Clinical Lists Changes  Orders: Added new Service order of EKG w/ Interpretation ([id number]) - Signed Added new Service order of Admin 1st Vaccine (43329) - Signed Added new Service order of Flu Vaccine 72yrs + (51884) - Signed Observations: Added new observation of FLU VAX VIS: 07/21/10 version (12/16/2010 13:14) Added new observation of FLU VAXLOT: [id number] (12/16/2010 13:14) Added new observation of FLU VAXMFR: [employer] (12/16/2010 13:14) Added new observation of FLU VAX EXP: 06/26/2011 (12/16/2010 13:14) Added new observation of FLU VAX DSE: 0.6ml (12/16/2010 13:14) Added new observation of FLU VAX: Fluvax 3+ (12/16/2010 13:14)Flu Vaccine Consent Questions     Do you have a history of severe allergic reactions to this vaccine? no    Any prior history of allergic reactions to egg and/or gelatin? no    Do you have a sensitivity to the preservative Thimersol? no    Do you have a past  history of Guillan-Barre Syndrome? no    Do you currently have an acute febrile illness? no    Have you ever had a severe reaction to latex? no    Vaccine information given and explained to patient? yes    Are you currently pregnant? no    Lot Number:[id number]   Exp Date:06/26/2011   Site Given  Right  Deltoid IMw observation of FLU VAX DSE: 0.74ml (12/16/2010 13:14) Added new observation of FLU VAX: Fluvax 3+ (12/16/2010 13:14)     .lbflu

## 2011-11-02 ENCOUNTER — Other Ambulatory Visit: Payer: Self-pay | Admitting: Family Medicine

## 2011-11-02 DIAGNOSIS — Z1231 Encounter for screening mammogram for malignant neoplasm of breast: Secondary | ICD-10-CM

## 2011-12-08 ENCOUNTER — Ambulatory Visit
Admission: RE | Admit: 2011-12-08 | Discharge: 2011-12-08 | Disposition: A | Discharge status: Home / Self Care | Payer: Managed Care, Other (non HMO) | Source: Ambulatory Visit | Attending: Family Medicine | Admitting: Family Medicine

## 2011-12-08 DIAGNOSIS — Z1231 Encounter for screening mammogram for malignant neoplasm of breast: Secondary | ICD-10-CM

## 2011-12-20 ENCOUNTER — Encounter: Payer: Self-pay | Admitting: Family Medicine

## 2011-12-23 ENCOUNTER — Ambulatory Visit (INDEPENDENT_AMBULATORY_CARE_PROVIDER_SITE_OTHER): Payer: Managed Care, Other (non HMO) | Admitting: Family Medicine

## 2011-12-23 ENCOUNTER — Encounter: Payer: Self-pay | Admitting: Family Medicine

## 2011-12-23 VITALS — Temp 98.1°F | Ht 61.5 in | Wt 127.0 lb

## 2011-12-23 DIAGNOSIS — N952 Postmenopausal atrophic vaginitis: Secondary | ICD-10-CM

## 2011-12-23 DIAGNOSIS — G43909 Migraine, unspecified, not intractable, without status migrainosus: Secondary | ICD-10-CM

## 2011-12-23 DIAGNOSIS — L259 Unspecified contact dermatitis, unspecified cause: Secondary | ICD-10-CM

## 2011-12-23 DIAGNOSIS — Z0000 Encounter for general adult medical examination without abnormal findings: Secondary | ICD-10-CM

## 2011-12-23 DIAGNOSIS — Z23 Encounter for immunization: Secondary | ICD-10-CM

## 2011-12-23 LAB — POCT URINALYSIS DIPSTICK
Bilirubin, UA: NEGATIVE
Blood, UA: NEGATIVE
Glucose, UA: NEGATIVE
Ketones, UA: NEGATIVE
Leukocytes, UA: NEGATIVE
Nitrite, UA: NEGATIVE
Protein, UA: NEGATIVE
Spec Grav, UA: 1.01
Urobilinogen, UA: 0.2
pH, UA: 5

## 2011-12-23 LAB — CBC WITH DIFFERENTIAL/PLATELET
Basophils Absolute: 0 10*3/uL (ref 0.0–0.1)
Basophils Relative: 0.5 % (ref 0.0–3.0)
Eosinophils Absolute: 0 10*3/uL (ref 0.0–0.7)
Eosinophils Relative: 0.1 % (ref 0.0–5.0)
HCT: 33.4 % — ABNORMAL LOW (ref 36.0–46.0)
Hemoglobin: 11 g/dL — ABNORMAL LOW (ref 12.0–15.0)
Lymphocytes Relative: 30.6 % (ref 12.0–46.0)
Lymphs Abs: 2.1 10*3/uL (ref 0.7–4.0)
MCHC: 32.9 g/dL (ref 30.0–36.0)
MCV: 73.3 fl — ABNORMAL LOW (ref 78.0–100.0)
Monocytes Absolute: 0.4 10*3/uL (ref 0.1–1.0)
Monocytes Relative: 6 % (ref 3.0–12.0)
Neutro Abs: 4.2 10*3/uL (ref 1.4–7.7)
Neutrophils Relative %: 62.8 % (ref 43.0–77.0)
Platelets: 264 10*3/uL (ref 150.0–400.0)
RBC: 4.55 Mil/uL (ref 3.87–5.11)
RDW: 16 % — ABNORMAL HIGH (ref 11.5–14.6)
WBC: 6.7 10*3/uL (ref 4.5–10.5)

## 2011-12-23 LAB — LIPID PANEL
Cholesterol: 194 mg/dL (ref 0–200)
HDL: 73.5 mg/dL (ref >39.00)
LDL Cholesterol: 114 mg/dL — ABNORMAL HIGH (ref 0–99)
Total CHOL/HDL Ratio: 3
Triglycerides: 33 mg/dL (ref 0.0–149.0)
VLDL: 6.6 mg/dL (ref 0.0–40.0)

## 2011-12-23 LAB — HEPATIC FUNCTION PANEL
ALT: 18 U/L (ref 0–35)
AST: 27 U/L (ref 0–37)
Albumin: 4.2 g/dL (ref 3.5–5.2)
Alkaline Phosphatase: 81 U/L (ref 39–117)
Bilirubin, Direct: 0.1 mg/dL (ref 0.0–0.3)
Total Bilirubin: 0.7 mg/dL (ref 0.3–1.2)
Total Protein: 7.4 g/dL (ref 6.0–8.3)

## 2011-12-23 LAB — BASIC METABOLIC PANEL
BUN: 14 mg/dL (ref 6–23)
CO2: 28 mEq/L (ref 19–32)
Calcium: 9.2 mg/dL (ref 8.4–10.5)
Chloride: 101 mEq/L (ref 96–112)
Creatinine, Ser: 0.7 mg/dL (ref 0.4–1.2)
GFR: 113.94 mL/min (ref >60.00)
Glucose, Bld: 81 mg/dL (ref 70–99)
Potassium: 3.4 mEq/L — ABNORMAL LOW (ref 3.5–5.1)
Sodium: 136 mEq/L (ref 135–145)

## 2011-12-23 LAB — TSH: TSH: 0.87 u[IU]/mL (ref 0.35–5.50)

## 2011-12-23 MED ORDER — ESTROGENS, CONJUGATED 0.625 MG/GM VA CREA: 0.5000 g | TOPICAL_CREAM | Freq: Every day | VAGINAL | Status: DC

## 2011-12-23 MED ORDER — ZOLMITRIPTAN 2.5 MG PO TBDP: 2.5000 mg | ORAL_TABLET | ORAL | Status: DC | PRN

## 2011-12-23 MED ORDER — FLUOCINONIDE 0.05 % EX CREA: 1.0000 "application " | TOPICAL_CREAM | Freq: Two times a day (BID) | CUTANEOUS | Status: DC

## 2011-12-23 NOTE — Patient Instructions (Signed)
Take the Zomig as needed for migraine headaches.  Use the cortisone cream for the rash that we discussed.  Stop the aspirin.  Restart the Premarin vaginal cream twice weekly.  Return in one year, sooner if any problem

## 2011-12-23 NOTE — Progress Notes (Signed)
  Subjective:    Patient ID: Deborah Barton, female    DOB: 06/08/1946, 65 y.o.   MRN: [id number]  HPI Deborah Barton is a 65 year old female, nonsmoker, who comes in today for general physical examination because of postmenopausal vaginal dryness, contact dermatitis, migraine headaches.  She takes over-the-counter Motrin for migraine headaches.  It doesn't seem to work.  She would has one or two a year, but they seem to be pretty severe.  We discussed other options she would like to try some Zomig.  The last time, on she's had a rash and she points round her back and under her breasts, consistent with the areas covered by her bra.  It comes and goes.  She treats her with an antihistamine and a steroid cream.  She takes an aspirin tablet daily, and is having trouble with bruising.  Tetanus 2009, seasonal flu shot today, Premarin vaginal cream, but she is not using.   Review of Systems  Constitutional: Negative.   HENT: Negative.   Eyes: Negative.   Respiratory: Negative.   Cardiovascular: Negative.   Gastrointestinal: Negative.   Genitourinary: Negative.   Musculoskeletal: Negative.   Neurological: Negative.   Hematological: Negative.   Psychiatric/Behavioral: Negative.        Objective:   Physical Exam  Constitutional: She appears well-developed and well-nourished.  HENT:  Head: Normocephalic and atraumatic.  Right Ear: External ear normal.  Left Ear: External ear normal.  Nose: Nose normal.  Mouth/Throat: Oropharynx is clear and moist.  Eyes: EOM are normal. Pupils are equal, round, and reactive to light.  Neck: Normal range of motion. Neck supple. No thyromegaly present.  Cardiovascular: Normal rate, regular rhythm, normal heart sounds and intact distal pulses.  Exam reveals no gallop and no friction rub.   No murmur heard. Pulmonary/Chest: Effort normal and breath sounds normal.  Abdominal: Soft. Bowel sounds are normal. She exhibits no distension and no mass. There is no  tenderness. There is no rebound.  Genitourinary: Vagina normal and uterus normal. Guaiac negative stool. No vaginal discharge found.       Bilateral breast exam normal.  Pelvic exam normal except for extreme vaginal dryness  Musculoskeletal: Normal range of motion.  Lymphadenopathy:    She has no cervical adenopathy.  Neurological: She is alert. She has normal reflexes. No cranial nerve deficit. She exhibits normal muscle tone. Coordination normal.  Skin: Skin is warm and dry.  Psychiatric: She has a normal mood and affect. Her behavior is normal. Judgment and thought content normal.          Assessment & Plan:  Healthy female.  Contact dermatitis.  Continue to use the steroid cream p.r.n.  Postmenopausal vaginal dryness.  Resume Premarin vaginal cream twice weekly.  Migraine headache.  Trial of zomig  Bruising.  Stop aspirin

## 2012-09-13 ENCOUNTER — Telehealth: Payer: Self-pay | Admitting: Family Medicine

## 2012-09-13 NOTE — Telephone Encounter (Signed)
Pt called and wants to know when she had her last colonoscopy and when she needs to sch the next one?

## 2012-09-14 NOTE — Telephone Encounter (Signed)
Patient is aware 

## 2012-12-01 ENCOUNTER — Other Ambulatory Visit: Payer: Self-pay | Admitting: Family Medicine

## 2012-12-01 DIAGNOSIS — Z1231 Encounter for screening mammogram for malignant neoplasm of breast: Secondary | ICD-10-CM

## 2012-12-08 ENCOUNTER — Ambulatory Visit
Admission: RE | Admit: 2012-12-08 | Discharge: 2012-12-08 | Disposition: A | Discharge status: Home / Self Care | Payer: Medicare HMO | Source: Ambulatory Visit | Attending: Family Medicine | Admitting: Family Medicine

## 2012-12-08 DIAGNOSIS — Z1231 Encounter for screening mammogram for malignant neoplasm of breast: Secondary | ICD-10-CM

## 2012-12-13 ENCOUNTER — Other Ambulatory Visit (INDEPENDENT_AMBULATORY_CARE_PROVIDER_SITE_OTHER): Payer: Managed Care, Other (non HMO)

## 2012-12-13 DIAGNOSIS — Z0000 Encounter for general adult medical examination without abnormal findings: Secondary | ICD-10-CM

## 2012-12-13 LAB — TSH: TSH: 1.44 u[IU]/mL (ref 0.35–5.50)

## 2012-12-13 LAB — CBC WITH DIFFERENTIAL/PLATELET
Basophils Absolute: 0 10*3/uL (ref 0.0–0.1)
Basophils Relative: 0.4 % (ref 0.0–3.0)
Eosinophils Absolute: 0 10*3/uL (ref 0.0–0.7)
Eosinophils Relative: 0.5 % (ref 0.0–5.0)
HCT: 35.5 % — ABNORMAL LOW (ref 36.0–46.0)
Hemoglobin: 11.2 g/dL — ABNORMAL LOW (ref 12.0–15.0)
Lymphocytes Relative: 31.9 % (ref 12.0–46.0)
Lymphs Abs: 1.8 10*3/uL (ref 0.7–4.0)
MCHC: 31.6 g/dL (ref 30.0–36.0)
MCV: 73.4 fl — ABNORMAL LOW (ref 78.0–100.0)
Monocytes Absolute: 0.3 10*3/uL (ref 0.1–1.0)
Monocytes Relative: 5.7 % (ref 3.0–12.0)
Neutro Abs: 3.4 10*3/uL (ref 1.4–7.7)
Neutrophils Relative %: 61.5 % (ref 43.0–77.0)
Platelets: 287 10*3/uL (ref 150.0–400.0)
RBC: 4.83 Mil/uL (ref 3.87–5.11)
RDW: 15.8 % — ABNORMAL HIGH (ref 11.5–14.6)
WBC: 5.6 10*3/uL (ref 4.5–10.5)

## 2012-12-13 LAB — LIPID PANEL
Cholesterol: 194 mg/dL (ref 0–200)
HDL: 67.9 mg/dL (ref >39.00)
LDL Cholesterol: 112 mg/dL — ABNORMAL HIGH (ref 0–99)
Total CHOL/HDL Ratio: 3
Triglycerides: 71 mg/dL (ref 0.0–149.0)
VLDL: 14.2 mg/dL (ref 0.0–40.0)

## 2012-12-13 LAB — POCT URINALYSIS DIPSTICK
Bilirubin, UA: NEGATIVE
Blood, UA: NEGATIVE
Glucose, UA: NEGATIVE
Ketones, UA: NEGATIVE
Leukocytes, UA: NEGATIVE
Nitrite, UA: NEGATIVE
Protein, UA: NEGATIVE
Spec Grav, UA: 1.02
Urobilinogen, UA: 0.2
pH, UA: 5

## 2012-12-13 LAB — BASIC METABOLIC PANEL
BUN: 12 mg/dL (ref 6–23)
CO2: 28 mEq/L (ref 19–32)
Calcium: 9.6 mg/dL (ref 8.4–10.5)
Chloride: 104 mEq/L (ref 96–112)
Creatinine, Ser: 0.8 mg/dL (ref 0.4–1.2)
GFR: 99.73 mL/min (ref >60.00)
Glucose, Bld: 82 mg/dL (ref 70–99)
Potassium: 3.6 mEq/L (ref 3.5–5.1)
Sodium: 139 mEq/L (ref 135–145)

## 2012-12-13 LAB — HEPATIC FUNCTION PANEL
ALT: 18 U/L (ref 0–35)
AST: 23 U/L (ref 0–37)
Albumin: 4.1 g/dL (ref 3.5–5.2)
Alkaline Phosphatase: 80 U/L (ref 39–117)
Bilirubin, Direct: 0 mg/dL (ref 0.0–0.3)
Total Bilirubin: 1 mg/dL (ref 0.3–1.2)
Total Protein: 7.6 g/dL (ref 6.0–8.3)

## 2012-12-25 ENCOUNTER — Encounter: Payer: Managed Care, Other (non HMO) | Admitting: Family Medicine

## 2013-01-01 ENCOUNTER — Encounter: Payer: Self-pay | Admitting: Family Medicine

## 2013-01-04 ENCOUNTER — Ambulatory Visit (INDEPENDENT_AMBULATORY_CARE_PROVIDER_SITE_OTHER): Payer: Managed Care, Other (non HMO) | Admitting: Family Medicine

## 2013-01-04 ENCOUNTER — Encounter: Payer: Self-pay | Admitting: Family Medicine

## 2013-01-04 VITALS — BP 148/80 | Temp 98.6°F | Wt 132.0 lb

## 2013-01-04 DIAGNOSIS — G43909 Migraine, unspecified, not intractable, without status migrainosus: Secondary | ICD-10-CM

## 2013-01-04 DIAGNOSIS — N952 Postmenopausal atrophic vaginitis: Secondary | ICD-10-CM

## 2013-01-04 DIAGNOSIS — Z23 Encounter for immunization: Secondary | ICD-10-CM

## 2013-01-04 DIAGNOSIS — Z0000 Encounter for general adult medical examination without abnormal findings: Secondary | ICD-10-CM

## 2013-01-04 DIAGNOSIS — L259 Unspecified contact dermatitis, unspecified cause: Secondary | ICD-10-CM

## 2013-01-04 MED ORDER — ESTROGENS, CONJUGATED 0.625 MG/GM VA CREA: 0.5000 g | TOPICAL_CREAM | Freq: Every day | VAGINAL | Status: AC

## 2013-01-04 MED ORDER — FLUOCINONIDE 0.05 % EX CREA: 1.0000 "application " | TOPICAL_CREAM | Freq: Two times a day (BID) | CUTANEOUS | Status: DC

## 2013-01-04 NOTE — Patient Instructions (Signed)
Continue your good health habits  Use small amounts of the hormonal cream twice weekly  Remember to do a thorough breast exam monthly at home and get your mammogram once a year

## 2013-01-04 NOTE — Progress Notes (Signed)
  Subjective:    Patient ID: Deborah Barton, female    DOB: [birth year], 67 y.o.   MRN: [id number]  HPI Deborah Barton is a 67 year old single female nonsmoker G0 P0 whose have a hysterectomy for nonmalignant reasons who comes in today for general physical examination  She has trouble with a contact dermatitis she also tried the hormonal cream however she was using the applicator and have a lot of drainage. Advised not to use the applicator.  In the past year she's had no migraine headaches  She gets routine eye care, dental care, does not do BSE monthly, recent mammogram normal, colonoscopy normal vaccinations up-to-date tetanus 2009, seasonal flu shot today, information given on Pneumovax and shingles  She works as a Production designer, theatre/television/film for a Research officer, political party at [employer]   Review of Systems  Constitutional: Negative.   HENT: Negative.   Eyes: Negative.   Respiratory: Negative.   Cardiovascular: Negative.   Gastrointestinal: Negative.   Genitourinary: Negative.   Musculoskeletal: Negative.   Neurological: Negative.   Hematological: Negative.   Psychiatric/Behavioral: Negative.        Objective:   Physical Exam  Constitutional: She appears well-developed and well-nourished.  HENT:  Head: Normocephalic and atraumatic.  Right Ear: External ear normal.  Left Ear: External ear normal.  Nose: Nose normal.  Mouth/Throat: Oropharynx is clear and moist.  Eyes: EOM are normal. Pupils are equal, round, and reactive to light.  Neck: Normal range of motion. Neck supple. No thyromegaly present.  Cardiovascular: Normal rate, regular rhythm, normal heart sounds and intact distal pulses.  Exam reveals no gallop and no friction rub.   No murmur heard. Pulmonary/Chest: Effort normal and breath sounds normal.  Abdominal: Soft. Bowel sounds are normal. She exhibits no distension and no mass. There is no tenderness. There is no rebound.  Genitourinary: Vagina normal. Guaiac negative stool. No vaginal discharge  found.       Bilateral breast exam normal  Musculoskeletal: Normal range of motion.  Lymphadenopathy:    She has no cervical adenopathy.  Neurological: She is alert. She has normal reflexes. No cranial nerve deficit. She exhibits normal muscle tone. Coordination normal.  Skin: Skin is warm and dry.  Psychiatric: She has a normal mood and affect. Her behavior is normal. Judgment and thought content normal.   breast exam normal except for right nipple is inverted        Assessment & Plan:  Healthy female  Postmenopausal vaginal dryness small amounts of the hormonal cream twice weekly  Contact dermatitis Lidex when necessary

## 2013-01-21 ENCOUNTER — Other Ambulatory Visit: Payer: Self-pay | Admitting: Family Medicine

## 2013-02-21 DIAGNOSIS — Y9389 Activity, other specified: Secondary | ICD-10-CM | POA: Insufficient documentation

## 2013-02-21 DIAGNOSIS — T07XXXA Unspecified multiple injuries, initial encounter: Secondary | ICD-10-CM | POA: Insufficient documentation

## 2013-02-21 DIAGNOSIS — Y92410 Unspecified street and highway as the place of occurrence of the external cause: Secondary | ICD-10-CM | POA: Insufficient documentation

## 2013-02-21 NOTE — ED Notes (Signed)
Pt walks with a sure and steady gait no assistance required. Denies numbness in all extremities
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
## 2013-02-22 ENCOUNTER — Emergency Department (HOSPITAL_COMMUNITY)
Admission: EM | Admit: 2013-02-22 | Discharge: 2013-02-22 | Disposition: A | Discharge status: Home / Self Care | Payer: Managed Care, Other (non HMO) | Attending: Emergency Medicine | Admitting: Emergency Medicine

## 2013-02-22 ENCOUNTER — Encounter (HOSPITAL_COMMUNITY): Payer: Self-pay

## 2013-02-22 MED ORDER — CYCLOBENZAPRINE HCL 10 MG PO TABS: 10.0000 mg | ORAL_TABLET | Freq: Three times a day (TID) | ORAL | Status: DC | PRN

## 2013-02-22 MED ORDER — NAPROXEN 375 MG PO TABS: 375.0000 mg | ORAL_TABLET | Freq: Two times a day (BID) | ORAL | Status: DC
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
## 2013-02-22 NOTE — ED Provider Notes (Signed)
Medical screening examination/treatment/procedure(s) were performed by non-physician practitioner and as supervising physician I was immediately available for consultation/collaboration.  Sunnie Nielsen, MD 02/22/13 2306

## 2013-12-04 ENCOUNTER — Other Ambulatory Visit: Payer: Self-pay

## 2013-12-04 DIAGNOSIS — Z1231 Encounter for screening mammogram for malignant neoplasm of breast: Secondary | ICD-10-CM

## 2013-12-26 ENCOUNTER — Other Ambulatory Visit: Payer: Self-pay | Admitting: Family Medicine

## 2013-12-26 ENCOUNTER — Ambulatory Visit (HOSPITAL_COMMUNITY)
Admission: RE | Admit: 2013-12-26 | Discharge: 2013-12-26 | Disposition: A | Discharge status: Home / Self Care | Payer: Managed Care, Other (non HMO) | Source: Ambulatory Visit | Attending: Family Medicine | Admitting: Family Medicine

## 2013-12-26 DIAGNOSIS — Z1231 Encounter for screening mammogram for malignant neoplasm of breast: Secondary | ICD-10-CM | POA: Insufficient documentation

## 2014-04-02 ENCOUNTER — Encounter: Payer: Self-pay | Admitting: Family Medicine

## 2014-04-02 ENCOUNTER — Ambulatory Visit (INDEPENDENT_AMBULATORY_CARE_PROVIDER_SITE_OTHER): Payer: Medicare Other | Admitting: Family Medicine

## 2014-04-02 VITALS — BP 140/82 | HR 78 | Temp 98.4°F | Ht 61.5 in | Wt 135.0 lb

## 2014-04-02 DIAGNOSIS — N952 Postmenopausal atrophic vaginitis: Secondary | ICD-10-CM

## 2014-04-02 DIAGNOSIS — Z23 Encounter for immunization: Secondary | ICD-10-CM

## 2014-04-02 DIAGNOSIS — Z0000 Encounter for general adult medical examination without abnormal findings: Secondary | ICD-10-CM

## 2014-04-02 DIAGNOSIS — D649 Anemia, unspecified: Secondary | ICD-10-CM

## 2014-04-02 DIAGNOSIS — G43909 Migraine, unspecified, not intractable, without status migrainosus: Secondary | ICD-10-CM

## 2014-04-02 LAB — CBC WITH DIFFERENTIAL/PLATELET
Basophils Absolute: 0 10*3/uL (ref 0.0–0.1)
Basophils Relative: 0.3 % (ref 0.0–3.0)
Eosinophils Absolute: 0 10*3/uL (ref 0.0–0.7)
Eosinophils Relative: 0.5 % (ref 0.0–5.0)
HCT: 35.2 % — ABNORMAL LOW (ref 36.0–46.0)
Hemoglobin: 11.4 g/dL — ABNORMAL LOW (ref 12.0–15.0)
Lymphocytes Relative: 31.8 % (ref 12.0–46.0)
Lymphs Abs: 2 10*3/uL (ref 0.7–4.0)
MCHC: 32.3 g/dL (ref 30.0–36.0)
MCV: 73.7 fl — ABNORMAL LOW (ref 78.0–100.0)
Monocytes Absolute: 0.4 10*3/uL (ref 0.1–1.0)
Monocytes Relative: 6 % (ref 3.0–12.0)
Neutro Abs: 3.8 10*3/uL (ref 1.4–7.7)
Neutrophils Relative %: 61.4 % (ref 43.0–77.0)
Platelets: 303 10*3/uL (ref 150.0–400.0)
RBC: 4.78 Mil/uL (ref 3.87–5.11)
RDW: 16.6 % — ABNORMAL HIGH (ref 11.5–14.6)
WBC: 6.2 10*3/uL (ref 4.5–10.5)

## 2014-04-02 MED ORDER — ESTROGENS, CONJUGATED 0.625 MG/GM VA CREA: TOPICAL_CREAM | VAGINAL | Status: DC

## 2014-04-02 MED ORDER — ZOLMITRIPTAN 2.5 MG PO TBDP: 2.5000 mg | ORAL_TABLET | ORAL | Status: DC | PRN

## 2014-04-02 NOTE — Progress Notes (Signed)
Pre visit review using our clinic review tool, if applicable. No additional management support is needed unless otherwise documented below in the visit note. 

## 2014-04-02 NOTE — Patient Instructions (Signed)
Premarin vaginal cream,,,,,,,,,,, apply small amounts 2-3 times weekly  Zomig,,,,,,,,, 1 stat when necessary for migraines  Return in one year sooner if any problems
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
## 2014-04-03 ENCOUNTER — Telehealth: Payer: Self-pay | Admitting: Family Medicine
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
## 2014-04-03 NOTE — Telephone Encounter (Signed)
Spoke with patient and her arm is better after taking Tylenol.  Patient would like to have her cholesterol and other labs done at a physical.  Okay to order?

## 2014-04-11 ENCOUNTER — Ambulatory Visit (INDEPENDENT_AMBULATORY_CARE_PROVIDER_SITE_OTHER): Payer: Medicare Other | Admitting: *Deleted

## 2014-04-11 DIAGNOSIS — Z0000 Encounter for general adult medical examination without abnormal findings: Secondary | ICD-10-CM

## 2014-04-12 NOTE — Progress Notes (Signed)
   Subjective:    Patient ID: Deborah Barton, female    DOB: [birth year], 68 y.o.   MRN: [id number]  HPI    Review of Systems     Objective:   Physical Exam        Assessment & Plan:  Patient came in for an EKG only.

## 2014-05-23 ENCOUNTER — Ambulatory Visit (INDEPENDENT_AMBULATORY_CARE_PROVIDER_SITE_OTHER): Payer: Medicare Other | Admitting: Family Medicine

## 2014-05-23 ENCOUNTER — Encounter: Payer: Self-pay | Admitting: Family Medicine

## 2014-05-23 DIAGNOSIS — IMO0001 Reserved for inherently not codable concepts without codable children: Secondary | ICD-10-CM
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
## 2014-05-23 NOTE — Patient Instructions (Signed)
Aleve,,,,,,,,,,,,, one twice daily  Hot shower twice daily  Return when necessary,

## 2014-05-23 NOTE — Progress Notes (Signed)
Pre visit review using our clinic review tool, if applicable. No additional management support is needed unless otherwise documented below in the visit note. 

## 2014-06-27 ENCOUNTER — Ambulatory Visit (INDEPENDENT_AMBULATORY_CARE_PROVIDER_SITE_OTHER): Payer: Medicare Other | Admitting: Family Medicine

## 2014-06-27 ENCOUNTER — Encounter: Payer: Self-pay | Admitting: Family Medicine

## 2014-06-27 VITALS — BP 130/80 | Temp 97.9°F | Wt 136.0 lb

## 2014-06-27 DIAGNOSIS — N952 Postmenopausal atrophic vaginitis: Secondary | ICD-10-CM

## 2014-06-27 DIAGNOSIS — Z0000 Encounter for general adult medical examination without abnormal findings: Secondary | ICD-10-CM

## 2014-06-27 DIAGNOSIS — F411 Generalized anxiety disorder: Secondary | ICD-10-CM

## 2014-06-27 DIAGNOSIS — G43109 Migraine with aura, not intractable, without status migrainosus: Secondary | ICD-10-CM

## 2014-06-27 LAB — BASIC METABOLIC PANEL
BUN: 16 mg/dL (ref 6–23)
CO2: 27 mEq/L (ref 19–32)
Calcium: 9.6 mg/dL (ref 8.4–10.5)
Chloride: 102 mEq/L (ref 96–112)
Creatinine, Ser: 0.8 mg/dL (ref 0.4–1.2)
GFR: 89.55 mL/min (ref >60.00)
Glucose, Bld: 98 mg/dL (ref 70–99)
Potassium: 3.7 mEq/L (ref 3.5–5.1)
Sodium: 136 mEq/L (ref 135–145)

## 2014-06-27 LAB — HEPATIC FUNCTION PANEL
ALT: 23 U/L (ref 0–35)
AST: 28 U/L (ref 0–37)
Albumin: 4.2 g/dL (ref 3.5–5.2)
Alkaline Phosphatase: 84 U/L (ref 39–117)
Bilirubin, Direct: 0.1 mg/dL (ref 0.0–0.3)
Total Bilirubin: 0.7 mg/dL (ref 0.2–1.2)
Total Protein: 7.5 g/dL (ref 6.0–8.3)

## 2014-06-27 LAB — POCT URINALYSIS DIPSTICK
Bilirubin, UA: NEGATIVE
Blood, UA: NEGATIVE
Glucose, UA: NEGATIVE
Ketones, UA: NEGATIVE
Leukocytes, UA: NEGATIVE
Nitrite, UA: NEGATIVE
Protein, UA: NEGATIVE
Spec Grav, UA: <=1.005
Urobilinogen, UA: 0.2
pH, UA: 5.5

## 2014-06-27 LAB — TSH: TSH: 1.71 u[IU]/mL (ref 0.35–4.50)

## 2014-06-27 MED ORDER — ZOLMITRIPTAN 2.5 MG PO TBDP: 2.5000 mg | ORAL_TABLET | ORAL | Status: DC | PRN

## 2014-06-27 MED ORDER — ESTROGENS, CONJUGATED 0.625 MG/GM VA CREA: TOPICAL_CREAM | VAGINAL | Status: DC

## 2014-06-27 MED ORDER — LORAZEPAM 0.5 MG PO TABS: ORAL_TABLET | ORAL | Status: DC

## 2014-06-27 NOTE — Patient Instructions (Signed)
Continue the Zomig when necessary for migraine headaches  Continue the hormonal cream small amounts twice weekly  Ativan 0.5........... one twice daily when necessary...  Call and make an appointment to see Richardo Priest for evaluation and help dealing with her anxiety

## 2014-06-27 NOTE — Progress Notes (Signed)
   Subjective:    Patient ID: Deborah Barton, female    DOB: 02/07/1946, 68 y.o.   MRN: [id number]  HPI Deborah Barton is a 68 year old single female nonsmoker who comes in today for evaluation of postmenopausal vaginal dryness, anxiety related to not working and issues with her boyfriend, migraine headaches  She had her uterus and ovaries removed in her 30s for nonmalignant reasons she uses hormonal cream twice weekly for vaginal dryness  She would like a prescription for Ativan. She said she lost her job March 15 of this year and has not been able to find work. She's also having issues with her boyfriend  She takes Zomig when necessary for migraine headaches  She gets routine eye care, dental care, does not do BSE monthly, and you mammography, followup colonoscopy and GI   Review of Systems  Constitutional: Negative.   HENT: Negative.   Eyes: Negative.   Respiratory: Negative.   Cardiovascular: Negative.   Gastrointestinal: Negative.   Genitourinary: Negative.   Musculoskeletal: Negative.   Neurological: Negative.   Psychiatric/Behavioral: Negative.        Objective:   Physical Exam  Nursing note and vitals reviewed. Constitutional: She appears well-developed and well-nourished.  HENT:  Head: Normocephalic and atraumatic.  Right Ear: External ear normal.  Left Ear: External ear normal.  Nose: Nose normal.  Mouth/Throat: Oropharynx is clear and moist.  Eyes: EOM are normal. Pupils are equal, round, and reactive to light.  Neck: Normal range of motion. Neck supple. No thyromegaly present.  Cardiovascular: Normal rate, regular rhythm, normal heart sounds and intact distal pulses.  Exam reveals no gallop and no friction rub.   No murmur heard. Pulmonary/Chest: Effort normal and breath sounds normal.  Abdominal: Soft. Bowel sounds are normal. She exhibits no distension and no mass. There is no tenderness. There is no rebound.  Genitourinary:  Bilateral breast exam  normal  Right nipple chronically inverted  Musculoskeletal: Normal range of motion.  Lymphadenopathy:    She has no cervical adenopathy.  Neurological: She is alert. She has normal reflexes. No cranial nerve deficit. She exhibits normal muscle tone. Coordination normal.  Skin: Skin is warm and dry.  Total body skin exam normal  Psychiatric: She has a normal mood and affect. Her behavior is normal. Judgment and thought content normal.          Assessment & Plan:  Healthy female  Migraine headaches Zomig when necessary  Postmenopausal vaginal dryness continue hormonal cream  Anxiety related to not working and boyfriend problems.......Marland Kitchen Ativan 0.5 twice a day and consult with Richardo Priest

## 2014-06-27 NOTE — Progress Notes (Signed)
Pre visit review using our clinic review tool, if applicable. No additional management support is needed unless otherwise documented below in the visit note. 

## 2014-09-23 ENCOUNTER — Encounter: Payer: Self-pay | Admitting: Family Medicine

## 2014-09-23 ENCOUNTER — Ambulatory Visit (INDEPENDENT_AMBULATORY_CARE_PROVIDER_SITE_OTHER): Payer: Medicare Other | Admitting: Family Medicine

## 2014-09-23 VITALS — BP 170/90 | Temp 98.4°F | Wt 134.0 lb

## 2014-09-23 DIAGNOSIS — O10019 Pre-existing essential hypertension complicating pregnancy, unspecified trimester: Secondary | ICD-10-CM

## 2014-09-23 DIAGNOSIS — I10 Essential (primary) hypertension: Secondary | ICD-10-CM

## 2014-09-23 DIAGNOSIS — Z23 Encounter for immunization: Secondary | ICD-10-CM

## 2014-09-23 MED ORDER — LISINOPRIL-HYDROCHLOROTHIAZIDE 10-12.5 MG PO TABS: 1.0000 | ORAL_TABLET | Freq: Every day | ORAL | Status: DC

## 2014-09-23 NOTE — Progress Notes (Signed)
   Subjective:    Patient ID: Deborah Barton, female    DOB: December 09, 1946, 68 y.o.   MRN: [id number]  HPI Zayneb is a 68 year old female nonsmoker who comes in today for evaluation of not feeling well for about a week. She says she's felt well nauseated 17 living in her fingers and toes a mild headache.  She states that about 10 years ago she had a brain scan which showed a "" scar. She was told it may of been related to an old migraine old, or congenital.  Mother has hypertension 2 brothers have hypertension one sister has hypertension. Last BP check in the summer we did her physical exam was normal   Review of Systems    review of systems otherwise negative Objective:   Physical Exam  Well-developed and nourished female no acute distress vital signs stable she's afebrile except BP right arm sitting position 170/90      Assessment & Plan:  New-onset hypertension.......Marland Kitchen begin Zestoretic........ BP check daily...Marland KitchenMarland KitchenMarland Kitchen followup in 2 weeks

## 2014-09-23 NOTE — Patient Instructions (Signed)
omron digital pump up blood pressure cuff........Marland Kitchen [place]............ check your blood pressure daily in the morning  Zestoretic 10-12.5...........Marland Kitchen 1 now then one daily every morning  Return in 2 weeks for followup  When you return bring a record of all your blood pressure readings and the device

## 2014-09-23 NOTE — Progress Notes (Signed)
Pre visit review using our clinic review tool, if applicable. No additional management support is needed unless otherwise documented below in the visit note. 

## 2014-09-25 ENCOUNTER — Encounter: Payer: Self-pay | Admitting: Family Medicine

## 2014-09-25 ENCOUNTER — Telehealth: Payer: Self-pay | Admitting: Family Medicine

## 2014-09-25 ENCOUNTER — Ambulatory Visit (INDEPENDENT_AMBULATORY_CARE_PROVIDER_SITE_OTHER): Payer: Medicare Other | Admitting: Family Medicine

## 2014-09-25 VITALS — BP 104/68 | Temp 97.9°F | Wt 132.0 lb

## 2014-09-25 DIAGNOSIS — I10 Essential (primary) hypertension: Secondary | ICD-10-CM

## 2014-09-25 DIAGNOSIS — O10019 Pre-existing essential hypertension complicating pregnancy, unspecified trimester: Secondary | ICD-10-CM

## 2014-09-25 NOTE — Patient Instructions (Signed)
Check your blood pressure daily in the morning  Stop your blood pressure medication  Followup in 4 weeks,,,,,,,,, bring a record of all your blood pressure readings and the device

## 2014-09-25 NOTE — Telephone Encounter (Signed)
Noted  

## 2014-09-25 NOTE — Telephone Encounter (Signed)
Patient Information:  Caller Name: Minal  Phone: [phone]  Patient: Deborah Barton, Deborah Barton  Gender: Female  DOB: 1946-10-19  Age: 68 Years  PCP: Stevie Kern [facility])  Office Follow Up:  Does the office need to follow up with this patient?: No  Instructions For The Office: N/A  RN Note:  Reports dizziness still present after resting and fluids.  Drank 6 oz water since got up at 0900. Advised to drink more water and decaffinated fluids.  Symptoms  Reason For Call & Symptoms: Diagnosed with HTN 09/23/14 due to BP 170/90 at office visit.  Started Lisinoptil/HCTZ on 09/23/14. 2nd call in 24 hrs. Testing BP at home. Concerned BP was low 101/60  09/24/14 at 1630.  Asking if should still be on the antihypertensive. Pt mentioned she "really does not have hypertension."  Continues to have intermittent dizziness and  mild to moderate headaches at top of head.  Visual "glaze" is gone; can focus well.  Reviewed Health History In EMR: Yes  Reviewed Medications In EMR: Yes  Reviewed Allergies In EMR: Yes  Reviewed Surgeries / Procedures: Yes  Date of Onset of Symptoms: 09/23/2014  Treatments Tried: excercising, following diet recommendations, avoiding caffeine. taking  Treatments Tried Worked: No  Guideline(s) Used:  Dizziness  Disposition Per Guideline:   Go to Office Now  Reason For Disposition Reached:   Lightheadedness (dizziness) present now, after 2 hours of rest and fluids  Advice Given:  Temporary Dizziness  is usually a harmless symptom. It can be caused by not drinking enough water during sports or hot weather. It can also be caused by skipping a meal, too much sun exposure, standing up suddenly, standing too long in one place or even a viral illness.  Some Causes of Temporary Dizziness:  Poor Fluid Intake - Not drinking enough fluids and being a little dehydrated is a common cause of temporary dizziness. This is always worse during hot weather.  Standing Up Suddenly -  Standing up suddenly (especially getting out of bed) or prolonged standing in one place are common causes of temporary dizziness. Not drinking enough fluids always makes it worse. Certain medications can cause or increase this type of dizziness (e.g., blood pressure medications).  Heat Exposure - Hot weather, hot tubs, or too much sun exposure are common causes of temporary dizziness. Not drinking enough fluids always makes it worse.  Drink Fluids:  Drink several glasses of fruit juice, other clear fluids, or water. This will improve hydration and blood glucose. If you have a fever or have had heat exposure, make sure the fluids are cold.  Rest for 1-2 Hours:  Lie down with feet elevated for 1 hour. This will improve blood flow and increase blood flow to the brain.  Stand Up Slowly:  In the mornings, sit up for a few minutes before you stand up. That will help your blood flow make the adjustment.  If you have to stand up for long periods of time, contract and relax your leg muscles to help pump the blood back to the heart.  Sit down or lie down if you feel dizzy.  Call Back If:  Passes out (faints)  You become worse.  Patient Will Follow Care Advice:  YES  Appointment Scheduled:  09/25/2014 11:15:00 Appointment Scheduled Provider:  Stevie Kern [facility])

## 2014-09-25 NOTE — Progress Notes (Signed)
   Subjective:    Patient ID: Deborah Barton, female    DOB: 1946/08/31, 68 y.o.   MRN: [id number]  HPI Deborah Barton is a 69 year old female who comes in today for evaluation of hypertension  We saw her earlier in the week her blood pressure is 170/100. She has a positive family history of hypertension. We started her on Zestoretic 10-12.5 her blood pressure proper dropped 104/68 and she's lightheaded when she stands up.   Review of Systems Review of systems otherwise negative    Objective:   Physical Exam  Well-developed well-nourished female no acute distress BP 107/68 set right arm sitting position      Assessment & Plan:  Elevated blood pressure........ question secondary distress not hypertension.......... stop blood pressure medication BP check daily followup in 4 weeks.

## 2014-09-25 NOTE — Progress Notes (Signed)
Pre visit review using our clinic review tool, if applicable. No additional management support is needed unless otherwise documented below in the visit note. 

## 2014-09-26 ENCOUNTER — Emergency Department (HOSPITAL_COMMUNITY): Payer: Medicare Other

## 2014-09-26 ENCOUNTER — Telehealth: Payer: Self-pay | Admitting: Family Medicine

## 2014-09-26 ENCOUNTER — Encounter (HOSPITAL_COMMUNITY): Payer: Self-pay | Admitting: Emergency Medicine

## 2014-09-26 ENCOUNTER — Emergency Department (HOSPITAL_COMMUNITY)
Admission: EM | Admit: 2014-09-26 | Discharge: 2014-09-26 | Disposition: A | Discharge status: Home / Self Care | Payer: Medicare Other | Attending: Emergency Medicine | Admitting: Emergency Medicine

## 2014-09-26 DIAGNOSIS — R531 Weakness: Secondary | ICD-10-CM | POA: Diagnosis not present

## 2014-09-26 DIAGNOSIS — I10 Essential (primary) hypertension: Secondary | ICD-10-CM | POA: Insufficient documentation

## 2014-09-26 DIAGNOSIS — R42 Dizziness and giddiness: Secondary | ICD-10-CM | POA: Diagnosis present

## 2014-09-26 DIAGNOSIS — Z79899 Other long term (current) drug therapy: Secondary | ICD-10-CM | POA: Diagnosis not present

## 2014-09-26 DIAGNOSIS — Z791 Long term (current) use of non-steroidal anti-inflammatories (NSAID): Secondary | ICD-10-CM | POA: Insufficient documentation

## 2014-09-26 HISTORY — DX: Essential (primary) hypertension: I10

## 2014-09-26 LAB — COMPREHENSIVE METABOLIC PANEL
ALT: 14 U/L (ref 0–35)
AST: 21 U/L (ref 0–37)
Albumin: 4.2 g/dL (ref 3.5–5.2)
Alkaline Phosphatase: 108 U/L (ref 39–117)
Anion gap: 12 (ref 5–15)
BUN: 21 mg/dL (ref 6–23)
CO2: 27 mEq/L (ref 19–32)
Calcium: 10.1 mg/dL (ref 8.4–10.5)
Chloride: 97 mEq/L (ref 96–112)
Creatinine, Ser: 0.91 mg/dL (ref 0.50–1.10)
GFR calc Af Amer: 75 mL/min — ABNORMAL LOW (ref >90)
GFR calc non Af Amer: 64 mL/min — ABNORMAL LOW (ref >90)
Glucose, Bld: 68 mg/dL — ABNORMAL LOW (ref 70–99)
Potassium: 4.1 mEq/L (ref 3.7–5.3)
Sodium: 136 mEq/L — ABNORMAL LOW (ref 137–147)
Total Bilirubin: 0.4 mg/dL (ref 0.3–1.2)
Total Protein: 8.3 g/dL (ref 6.0–8.3)

## 2014-09-26 LAB — URINALYSIS, ROUTINE W REFLEX MICROSCOPIC
Bilirubin Urine: NEGATIVE
Glucose, UA: NEGATIVE mg/dL
Hgb urine dipstick: NEGATIVE
Ketones, ur: NEGATIVE mg/dL
Leukocytes, UA: NEGATIVE
Nitrite: NEGATIVE
Protein, ur: NEGATIVE mg/dL
Specific Gravity, Urine: 1.005 (ref 1.005–1.030)
Urobilinogen, UA: 0.2 mg/dL (ref 0.0–1.0)
pH: 5.5 (ref 5.0–8.0)

## 2014-09-26 LAB — CBC WITH DIFFERENTIAL/PLATELET
Basophils Absolute: 0 10*3/uL (ref 0.0–0.1)
Basophils Relative: 1 % (ref 0–1)
Eosinophils Absolute: 0 10*3/uL (ref 0.0–0.7)
Eosinophils Relative: 0 % (ref 0–5)
HCT: 35.7 % — ABNORMAL LOW (ref 36.0–46.0)
Hemoglobin: 11.8 g/dL — ABNORMAL LOW (ref 12.0–15.0)
Lymphocytes Relative: 33 % (ref 12–46)
Lymphs Abs: 2.9 10*3/uL (ref 0.7–4.0)
MCH: 23.8 pg — ABNORMAL LOW (ref 26.0–34.0)
MCHC: 33.1 g/dL (ref 30.0–36.0)
MCV: 72.1 fL — ABNORMAL LOW (ref 78.0–100.0)
Monocytes Absolute: 0.5 10*3/uL (ref 0.1–1.0)
Monocytes Relative: 6 % (ref 3–12)
Neutro Abs: 5.4 10*3/uL (ref 1.7–7.7)
Neutrophils Relative %: 60 % (ref 43–77)
Platelets: 290 10*3/uL (ref 150–400)
RBC: 4.95 MIL/uL (ref 3.87–5.11)
RDW: 15.2 % (ref 11.5–15.5)
WBC: 8.9 10*3/uL (ref 4.0–10.5)

## 2014-09-26 LAB — TROPONIN I: Troponin I: <0.3 ng/mL (ref <0.30)

## 2014-09-26 MED ORDER — SODIUM CHLORIDE 0.9 % IV SOLN
INTRAVENOUS | Status: DC
  Administered 2014-09-26: 16:00:00 via INTRAVENOUS

## 2014-09-26 NOTE — ED Notes (Signed)
Patient transported to CT 

## 2014-09-26 NOTE — ED Notes (Signed)
Pt reports dizziness, weakness, blurred vision, dry mouth and L lower leg numbness that began last week. Has seen PCP and had BP medications readjusted. Pt states home BP was elevated at "140 something." BP 152/75 on arrival.

## 2014-09-26 NOTE — Telephone Encounter (Signed)
emmi mailed  °

## 2014-09-26 NOTE — ED Provider Notes (Addendum)
CSN: [id number]     Arrival date & time 09/26/14  1437 History   First MD Initiated Contact with Patient 09/26/14 1505     Chief Complaint  Patient presents with  . Dizziness  . Leg Pain     (Consider location/radiation/quality/duration/timing/severity/associated sxs/prior Treatment) HPI Comments: Pt with dizziness and weakness for 2 weeks--seen by pcp and dx with htn and placed on meds--began having low bp and htn meds stopped--sx have been off/on--describes as sense of room moving without associated n/v--also notes whole body numbness--sx can last from seconds to hours--no associated headache, confusion, or peripheral weakness- no chest pain//abd pain or palpitations-no syncope or near syncope, no polyuria/polydipsia-no gait disturbances-no recent fever or chills--sx resolve spontaneously  Patient is a 68 y.o. female presenting with dizziness and leg pain. The history is provided by the patient.  Dizziness Leg Pain   Past Medical History  Diagnosis Date  . Hypertension    Past Surgical History  Procedure Laterality Date  . Abdominal hysterectomy    . Cholecystectomy     Family History  Problem Relation Age of Onset  . Anemia Other   . Arthritis Other   . Asthma Other   . Depression Other   . Hypertension Other   . Stroke Other    History  Substance Use Topics  . Smoking status: Never Smoker   . Smokeless tobacco: Not on file  . Alcohol Use: No   OB History   Grav Para Term Preterm Abortions TAB SAB Ect Mult Living                 Review of Systems  Neurological: Positive for dizziness.  All other systems reviewed and are negative.     Allergies  Review of patient's allergies indicates no known allergies.  Home Medications   Prior to Admission medications   Medication Sig Start Date End Date Taking? Authorizing Provider  conjugated estrogens (PREMARIN) vaginal cream Apply small amounts twice weekly 06/27/14   Dorena Cookey, MD  cyclobenzaprine (FLEXERIL)  10 MG tablet Take 1 tablet (10 mg total) by mouth 3 (three) times daily as needed for muscle spasms. 02/22/13   Lisette Paz, PA-C  ibuprofen (ADVIL,MOTRIN) 200 MG tablet Take 200 mg by mouth every 6 (six) hours as needed.      Historical Provider, MD  lisinopril-hydrochlorothiazide (PRINZIDE,ZESTORETIC) 10-12.5 MG per tablet Take 1 tablet by mouth daily. 09/23/14   Dorena Cookey, MD  LORazepam (ATIVAN) 0.5 MG tablet 1 by mouth twice a day when necessary 06/27/14   Dorena Cookey, MD  naproxen (NAPROSYN) 375 MG tablet Take 1 tablet (375 mg total) by mouth 2 (two) times daily. 02/22/13   Lisette Paz, PA-C  zolmitriptan (ZOMIG ZMT) 2.5 MG disintegrating tablet Take 1 tablet (2.5 mg total) by mouth as needed for migraine. 06/27/14 10/05/16  Dorena Cookey, MD   BP 152/75  Pulse 78  Temp(Src) 98 F (36.7 C) (Oral)  Resp 16  SpO2 99% Physical Exam  Nursing note and vitals reviewed. Constitutional: She is oriented to person, place, and time. She appears well-developed and well-nourished.  Non-toxic appearance. No distress.  HENT:  Head: Normocephalic and atraumatic.  Eyes: Conjunctivae, EOM and lids are normal. Pupils are equal, round, and reactive to light.  Neck: Normal range of motion. Neck supple. No tracheal deviation present. No mass present.  Cardiovascular: Normal rate, regular rhythm and normal heart sounds.  Exam reveals no gallop.   No murmur heard. Pulmonary/Chest: Effort normal  and breath sounds normal. No stridor. No respiratory distress. She has no decreased breath sounds. She has no wheezes. She has no rhonchi. She has no rales.  Abdominal: Soft. Normal appearance and bowel sounds are normal. She exhibits no distension. There is no tenderness. There is no rebound and no CVA tenderness.  Musculoskeletal: Normal range of motion. She exhibits no edema and no tenderness.  Neurological: She is alert and oriented to person, place, and time. She has normal strength. No cranial nerve deficit or  sensory deficit. GCS eye subscore is 4. GCS verbal subscore is 5. GCS motor subscore is 6.  Skin: Skin is warm and dry. No abrasion and no rash noted.  Psychiatric: She has a normal mood and affect. Her speech is normal and behavior is normal.    ED Course  Procedures (including critical care time) Labs Review Labs Reviewed  URINE CULTURE  CBC WITH DIFFERENTIAL  COMPREHENSIVE METABOLIC PANEL  TROPONIN I  URINALYSIS, ROUTINE W REFLEX MICROSCOPIC    Imaging Review No results found.   EKG Interpretation   Date/Time:  Thursday September 26 2014 14:50:39 EDT Ventricular Rate:  80 PR Interval:  184 QRS Duration: 90 QT Interval:  357 QTC Calculation: 412 R Axis:   67 Text Interpretation:  Sinus rhythm Probable left atrial enlargement  Baseline wander in lead(s) V6 No significant change since last tracing  Confirmed by Winfred Leeds  MD, SAM [phone]) on 09/26/2014 3:01:08 PM      MDM   Final diagnoses:  None     Patient's labs and x-rays reviewed with her. No focal findings noted. Patient to followup with her Dr.  10:17 PM  patient was initially discharged but did complain of increased gait abnormality. Concern for cerebellar infarct was entertained and patient had MRI of her brain which was negative for signs of CVA. Patient to be discharged and followed by Dr. Leota Jacobsen, MD 09/26/14 1828  Leota Jacobsen, MD 09/26/14 2218

## 2014-09-26 NOTE — ED Notes (Signed)
MD at bedside. 

## 2014-09-26 NOTE — ED Notes (Signed)
DC currently on hold MR ordered

## 2014-09-26 NOTE — Discharge Instructions (Signed)

## 2014-09-26 NOTE — ED Notes (Signed)
Patient asking to speak with EDP---MD at bedside

## 2014-09-27 LAB — URINE CULTURE
Colony Count: NO GROWTH
Culture: NO GROWTH

## 2014-09-30 ENCOUNTER — Encounter: Payer: Self-pay | Admitting: Family Medicine

## 2014-09-30 ENCOUNTER — Ambulatory Visit (INDEPENDENT_AMBULATORY_CARE_PROVIDER_SITE_OTHER): Payer: Medicare Other | Admitting: Family Medicine

## 2014-09-30 VITALS — BP 120/80 | Temp 97.9°F | Wt 134.0 lb

## 2014-09-30 DIAGNOSIS — R42 Dizziness and giddiness: Secondary | ICD-10-CM

## 2014-09-30 NOTE — Patient Instructions (Signed)
We will set you up a neurologic evaluation ASAP to help define what is the cause of your symptoms

## 2014-09-30 NOTE — Progress Notes (Signed)
   Subjective:    Patient ID: Deborah Barton, female    DOB: November 17, 1946, 68 y.o.   MRN: [id number]  HPI Deborah Barton is a 68 year old female nonsmoker who comes in today following emergency room visit  We had seen her a couple weeks ago and her blood pressure was markedly elevated 170/90. We started on low dose of lisinopril and her blood pressure dropped too low therefore she stopped her medication. She then checked her blood pressure daily and her blood pressures have been normal. She has a positive family history of hypertension  She presented to the emergency room 4 days ago because of symptoms of dizziness. She also has tingling in her legs a bitter taste in her mouth and burning in the back of her neck and nausea. Studies including an MRI of her brain were normal. She comes in today for followup.   Review of Systems    review of systems otherwise negative Objective:   Physical Exam Well-developed well-nourished female no acute distress vital signs stable she is afebrile HEENT negative neck was supple cardiac exam normal neurologic exam she is oriented x3 she is alert she is awake able to relate her medical history. Muscle strength sensation gait all normal     Assessment & Plan:  Multiple symptoms etiology unknown...........Marland Kitchen

## 2014-09-30 NOTE — Progress Notes (Signed)
Pre visit review using our clinic review tool, if applicable. No additional management support is needed unless otherwise documented below in the visit note. 

## 2014-10-02 ENCOUNTER — Encounter: Payer: Self-pay | Admitting: Neurology

## 2014-10-02 ENCOUNTER — Ambulatory Visit (INDEPENDENT_AMBULATORY_CARE_PROVIDER_SITE_OTHER): Payer: Medicare Other | Admitting: Neurology

## 2014-10-02 VITALS — BP 120/68 | HR 86 | Resp 18 | Ht 62.0 in | Wt 133.3 lb

## 2014-10-02 DIAGNOSIS — M542 Cervicalgia: Secondary | ICD-10-CM

## 2014-10-02 DIAGNOSIS — R110 Nausea: Secondary | ICD-10-CM

## 2014-10-02 DIAGNOSIS — R42 Dizziness and giddiness: Secondary | ICD-10-CM

## 2014-10-02 DIAGNOSIS — R202 Paresthesia of skin: Secondary | ICD-10-CM

## 2014-10-02 DIAGNOSIS — R2681 Unsteadiness on feet: Secondary | ICD-10-CM
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
## 2014-10-02 NOTE — Patient Instructions (Addendum)
I can't explain all your symptoms for a neurologic perspective.  I would check an MRI of the cervical spine to look for a cause of burning in the back of the head and numbness in the hands and legs, but that would not explain the nausea.  [place]   10/14/14 10:15am

## 2014-10-07 ENCOUNTER — Ambulatory Visit: Payer: Medicare Other | Admitting: Family Medicine

## 2014-10-10 ENCOUNTER — Telehealth: Payer: Self-pay | Admitting: Neurology

## 2014-10-10 NOTE — Telephone Encounter (Signed)
Pt called regarding her MRI appt on Monday 10/14/14  Pt does not think she can do the MRI. Please call pt [phone]

## 2014-10-10 NOTE — Telephone Encounter (Signed)
Patient is aware that the MRI is with and open MRI machine she was advised to take a ativan after arriving at facility and to make sure to have a driver

## 2014-10-14 ENCOUNTER — Ambulatory Visit
Admission: RE | Admit: 2014-10-14 | Discharge: 2014-10-14 | Disposition: A | Discharge status: Home / Self Care | Payer: Medicare Other | Source: Ambulatory Visit | Attending: Neurology | Admitting: Neurology

## 2014-10-14 DIAGNOSIS — R202 Paresthesia of skin: Secondary | ICD-10-CM

## 2014-10-14 DIAGNOSIS — R42 Dizziness and giddiness: Secondary | ICD-10-CM

## 2014-10-14 DIAGNOSIS — R110 Nausea: Secondary | ICD-10-CM

## 2014-10-14 DIAGNOSIS — M542 Cervicalgia: Secondary | ICD-10-CM

## 2014-10-22 ENCOUNTER — Ambulatory Visit: Payer: Medicare Other | Admitting: Family Medicine

## 2014-10-24 ENCOUNTER — Ambulatory Visit (INDEPENDENT_AMBULATORY_CARE_PROVIDER_SITE_OTHER): Payer: Medicare Other | Admitting: Family Medicine

## 2014-10-24 ENCOUNTER — Encounter: Payer: Self-pay | Admitting: Family Medicine

## 2014-10-24 VITALS — BP 110/80 | Temp 99.0°F | Wt 130.0 lb

## 2014-10-24 DIAGNOSIS — I10 Essential (primary) hypertension: Secondary | ICD-10-CM

## 2014-10-24 DIAGNOSIS — O10019 Pre-existing essential hypertension complicating pregnancy, unspecified trimester: Secondary | ICD-10-CM

## 2014-10-24 NOTE — Progress Notes (Signed)
   Subjective:    Patient ID: Deborah Barton, female    DOB: 1946/09/16, 68 y.o.   MRN: [id number]  HPI Deborah Barton is a 68 year old female nonsmoker who comes in today for evaluation of 3 issues  We saw her in September blood pressure was markedly elevated. We started her on Zestoretic. She takes 5-6 0.25 mg daily. Blood pressure now 110/80. She does not think she has hypertension. She would like to stop her medication. Advised her to stop her medication but monitor blood pressure daily. If she sees her blood pressure go back up and restart her medication  We send her to neurology for evaluation of multiple symptoms. She still has multiple symptoms etiology unknown. She's frustrated that nobody knows the answer to her problem. She had a cervical MRI which showed some minor abnormalities. She says she never heard back from the neurologist about this. Advised her to call the neurology office for further evaluation.  She also has pain in her left great toe for one year. No history of trauma   Review of Systems    review of systems otherwise negative Objective:   Physical Exam  Well-developed well-nourished female no acute distress vital signs stable she's afebrile BP right arm sitting position 110/80 pulse 70 and regular  Examination of left great toe shows a fungal infection of the toenail. Otherwise the toe appears normal      Assessment & Plan:  Hypertension,,,,,,,, recommend she continue her medication daily,,,,,,,, however the patient is convinced she does not have hypertension and wants to stop her medication,,,,,,,, advised to stop her medication but monitor blood pressure daily. If blood pressure goes up restart her medication  Pain left great toe fungal infection of the toenail,,,,,,, referred to podiatry  Multiple neurologic symptoms etiology unknown,,,,,,, referred back to neurology to give her the report on her cervical spine films

## 2014-10-24 NOTE — Progress Notes (Signed)
Pre visit review using our clinic review tool, if applicable. No additional management support is needed unless otherwise documented below in the visit note. 

## 2014-10-24 NOTE — Patient Instructions (Signed)
Stop your blood pressure medication  Check your blood pressure daily in the morning  Blood pressure goal 135/85 or less........... if you see your blood pressure began to go up restart her medication and take it daily forever  Consult with Dr. Felisa Bonier and his group about your toenail  Call the neurology office to get the report on your cervical MRI

## 2014-10-25 ENCOUNTER — Emergency Department (HOSPITAL_COMMUNITY): Payer: Medicare Other

## 2014-10-25 ENCOUNTER — Telehealth: Payer: Self-pay | Admitting: Family Medicine

## 2014-10-25 ENCOUNTER — Emergency Department (HOSPITAL_COMMUNITY)
Admission: EM | Admit: 2014-10-25 | Discharge: 2014-10-25 | Disposition: A | Discharge status: Home / Self Care | Payer: Medicare Other | Attending: Emergency Medicine | Admitting: Emergency Medicine

## 2014-10-25 ENCOUNTER — Encounter (HOSPITAL_COMMUNITY): Payer: Self-pay | Admitting: Emergency Medicine

## 2014-10-25 ENCOUNTER — Telehealth: Payer: Self-pay | Admitting: *Deleted

## 2014-10-25 DIAGNOSIS — Z79899 Other long term (current) drug therapy: Secondary | ICD-10-CM | POA: Diagnosis not present

## 2014-10-25 DIAGNOSIS — R110 Nausea: Secondary | ICD-10-CM | POA: Insufficient documentation

## 2014-10-25 DIAGNOSIS — R519 Headache, unspecified: Secondary | ICD-10-CM

## 2014-10-25 DIAGNOSIS — Z7982 Long term (current) use of aspirin: Secondary | ICD-10-CM | POA: Insufficient documentation

## 2014-10-25 DIAGNOSIS — R51 Headache: Secondary | ICD-10-CM | POA: Insufficient documentation

## 2014-10-25 DIAGNOSIS — I10 Essential (primary) hypertension: Secondary | ICD-10-CM | POA: Diagnosis not present

## 2014-10-25 DIAGNOSIS — R202 Paresthesia of skin: Secondary | ICD-10-CM | POA: Diagnosis not present

## 2014-10-25 LAB — CBC
HCT: 33.3 % — ABNORMAL LOW (ref 36.0–46.0)
Hemoglobin: 11 g/dL — ABNORMAL LOW (ref 12.0–15.0)
MCH: 23.6 pg — ABNORMAL LOW (ref 26.0–34.0)
MCHC: 33 g/dL (ref 30.0–36.0)
MCV: 71.3 fL — ABNORMAL LOW (ref 78.0–100.0)
Platelets: 275 10*3/uL (ref 150–400)
RBC: 4.67 MIL/uL (ref 3.87–5.11)
RDW: 14.9 % (ref 11.5–15.5)
WBC: 6.9 10*3/uL (ref 4.0–10.5)

## 2014-10-25 LAB — BASIC METABOLIC PANEL
Anion gap: 12 (ref 5–15)
BUN: 22 mg/dL (ref 6–23)
CO2: 28 mEq/L (ref 19–32)
Calcium: 9.7 mg/dL (ref 8.4–10.5)
Chloride: 98 mEq/L (ref 96–112)
Creatinine, Ser: 1.03 mg/dL (ref 0.50–1.10)
GFR calc Af Amer: 64 mL/min — ABNORMAL LOW (ref >90)
GFR calc non Af Amer: 55 mL/min — ABNORMAL LOW (ref >90)
Glucose, Bld: 84 mg/dL (ref 70–99)
Potassium: 3.9 mEq/L (ref 3.7–5.3)
Sodium: 138 mEq/L (ref 137–147)

## 2014-10-25 NOTE — Discharge Instructions (Signed)
Return to the ED with any concerns including chest pain, difficulty breathing, weakness of arms or legs, double vision, changes in speech, decreased level of alertness/lethargy, or any other alarming symptoms

## 2014-10-25 NOTE — Telephone Encounter (Signed)
noted 

## 2014-10-25 NOTE — Telephone Encounter (Signed)
Patient never got her result from her test ordered by Dr. Tomi Likens she would also like to have them faxed to [place] she will come in to fill out a release form

## 2014-10-25 NOTE — ED Provider Notes (Signed)
CSN: [id number]     Arrival date & time 10/25/14  1026 History   First MD Initiated Contact with Patient 10/25/14 1111     Chief Complaint  Patient presents with  . Blood Pressure Problems   . Nausea  . Headache     (Consider location/radiation/quality/duration/timing/severity/associated sxs/prior Treatment) HPI Pt presents with c/o concern about her blood pressure.  She states she was told by her doctor yesterday to stop taking the BP pill as her pressures were running low.  Last night she began to feel pressure in her head and sensation of tingling in her scalp.  She also felt fatigued- she took her BP and it was 170s.  So she took one of her BP meds and an ativan.  She also had some nausea.  She went to sleep and blood pressure this morning was 90s.  She continues to feel fatigued and "just not right".  No focal weakness.  No double vision.  No changes in speech.  No chest pain or difficulty breathing.  She is very concerned about her symptoms and has had similar symptoms over the past month.  She was recently evaluated several weeks ago for similar symptoms and had MRI which was reassuring.   There are no other associated systemic symptoms, there are no other alleviating or modifying factors.   Past Medical History  Diagnosis Date  . Hypertension    Past Surgical History  Procedure Laterality Date  . Abdominal hysterectomy    . Cholecystectomy     Family History  Problem Relation Age of Onset  . Anemia Other   . Arthritis Other   . Asthma Other   . Depression Other   . Hypertension Other   . Stroke Other    History  Substance Use Topics  . Smoking status: Never Smoker   . Smokeless tobacco: Not on file  . Alcohol Use: Yes     Comment: wine   OB History   Grav Para Term Preterm Abortions TAB SAB Ect Mult Living                 Review of Systems ROS reviewed and all otherwise negative except for mentioned in HPI    Allergies  Review of patient's allergies  indicates no known allergies.  Home Medications   Prior to Admission medications   Medication Sig Start Date End Date Taking? Authorizing Provider  Aspirin-Acetaminophen-Caffeine (EXCEDRIN PO) Take 2 tablets by mouth daily as needed (headache).   Yes Historical Provider, MD  ibuprofen (ADVIL,MOTRIN) 200 MG tablet Take 400 mg by mouth every 6 (six) hours as needed.    Yes Historical Provider, MD  lisinopril-hydrochlorothiazide (PRINZIDE,ZESTORETIC) 10-12.5 MG per tablet Take 0.5 tablets by mouth daily. As needed 09/23/14  Yes Dorena Cookey, MD  LORazepam (ATIVAN) 0.5 MG tablet Take 0.5 mg by mouth daily as needed for anxiety (when necessary).   Yes Historical Provider, MD   BP 105/59  Pulse 61  Temp(Src) 98.3 F (36.8 C) (Oral)  Resp 16  SpO2 98% Vitals reviewed Physical Exam Physical Examination: General appearance - alert, well appearing, and in no distress Mental status - alert, oriented to person, place, and time Eyes - pupils equal and reactive, extraocular eye movements intact Mouth - mucous membranes moist, pharynx normal without lesions Chest - clear to auscultation, no wheezes, rales or rhonchi, symmetric air entry Heart - normal rate, regular rhythm, normal S1, S2, no murmurs, rubs, clicks or gallops Abdomen - soft, nontender, nondistended, no  masses or organomegaly Neurological - alert, oriented x 3, no cranial nerve defect, normal speech, strength 5/5 in extremities x 4, sensation intact Extremities - peripheral pulses normal, no pedal edema, no clubbing or cyanosis Skin - normal coloration and turgor, no rashes Psych- anxious about symptoms  ED Course  Procedures (including critical care time) Labs Review Labs Reviewed  CBC - Abnormal; Notable for the following:    Hemoglobin 11.0 (*)    HCT 33.3 (*)    MCV 71.3 (*)    MCH 23.6 (*)    All other components within normal limits  BASIC METABOLIC PANEL - Abnormal; Notable for the following:    GFR calc non Af Amer 55  (*)    GFR calc Af Amer 64 (*)    All other components within normal limits    Imaging Review Ct Head Wo Contrast  10/25/2014   CLINICAL DATA:  Pressure type headache with nausea ; hypertension  EXAM: CT HEAD WITHOUT CONTRAST  TECHNIQUE: Contiguous axial images were obtained from the base of the skull through the vertex without intravenous contrast.  COMPARISON:  Head CT September 26, 2014; brain MRI September 26, 2014  FINDINGS: The ventricles are normal in size and configuration. By CT, there is borderline cerebellar tonsillar ectopia. There is no intracranial mass, hemorrhage, extra-axial fluid collection, or midline shift. No focal gray-white compartment lesions are identified. No evidence of acute infarct. The bony calvarium appears intact. The mastoid air cells are clear.  IMPRESSION: No intracranial mass, hemorrhage, or focal gray -white compartment lesions/acute appearing infarct. Borderline cerebellar tonsillar ectopia.   Electronically Signed   By: Lowella Grip M.D.   On: 10/25/2014 12:33     EKG Interpretation   Date/Time:  Friday October 25 2014 12:23:28 EDT Ventricular Rate:  61 PR Interval:  198 QRS Duration: 85 QT Interval:  391 QTC Calculation: 394 R Axis:   21 Text Interpretation:  Sinus rhythm Normal ECG Confirmed by Canary Brim  MD,  MARTHA [phone]) on 10/25/2014 2:55:41 PM      MDM   Final diagnoses:  Headache  Paresthesia  Nausea    Pt presenting with c/o :not feeling right, tingling in her scalp, nausea, concern about her blood pressure being high, then too low after taking her bp med.  Workup in the ED reassuring.  No signs of TIA or CVA, doubt ACS, blood pressure reassuring in the ED with normal neuro exam.  Pt is highly anxious about these symptoms.  She has been seen earlier this month for similar symptoms and has followed up with her doctor.  Her MD advised her to record her blood pressures and to stop taking bp bill as pressures were running low. I have advised  her of the same.  She has many questions about dietary changes she could make, vitamin supplements and which brand she should take, etc.  I have advised her to discuss all these questions with her primary doctor and to followup with PMD.  Discharged with strict return precautions.  Pt agreeable with plan.    Threasa Beards, MD 10/26/14 [phone]

## 2014-10-25 NOTE — Telephone Encounter (Signed)
Patient Information:  Caller Name: Anaelle  Phone: [phone]  Patient: Deborah Barton  Gender: Female  DOB: 05-Oct-1946  Age: 68 Years  PCP: Stevie Kern [facility])  Office Follow Up:  Does the office need to follow up with this patient?: No  Instructions For The Office: N/A  RN Note:  Patient states she was seen in the office 10/24/14 for a burning sensation in the top of head, blurred vision, nausea, feeling "off balance," tingling in legs and hands, and a "pulling sensation" in her temples, onset for approx. 6 weeks. Patient states she has had CAT scan and MRI in the past for the same sx. Patient was advised to discontinue blood pressure medication, monitor blood pressure and follow up with Neurology.  Patient states she developed burning in the top of her head, "glary eyes," "pulling" in her temples, feeling "off balance, faint, shakey and weak" at approx. 2130 10/24/14, while laying in bed.  Patient states blood pressure was 170/84 at 2130 10/24/14. Patient states she then took her Zestoretic and Ativan. States sx improved but not completely resolved. Patient states she continues to feel a slight tingling/burning in the back of her head. States blood pressure 96/67 at 0830 10/25/14. Denies headache. Patient continues to complain of nausea and mild dizziness.  Blood pressure 122/80 at 0911 10/25/14.Patient states she feels like the room is spinning at times. Care advice given per guidelines. Patient advised to change positions slowly, with assistance. Patient advised not to drive self. RN spoke with Lurline Hare, in office, and informed of above. Orders received: Patient to go to the ED for evaluation. Patient informed of above. Patient advised not to drive self. Patient advised to call 911 if sx increase. Patient verbalizes understanding and agreeable. Patient states she will go to the ED at [facility].  Symptoms  Reason For Call & Symptoms: Blood pressure problems  Reviewed Health History In EMR: Yes  Reviewed Medications In EMR: Yes  Reviewed Allergies In EMR: Yes  Reviewed Surgeries / Procedures: Yes  Date of Onset of Symptoms: 09/11/2014  Guideline(s) Used:  Neurologic Deficit  Dizziness  Disposition Per Guideline:   Go to ED Now (or to Office with PCP Approval)  Reason For Disposition Reached:   Headache (with neurologic deficit)  Advice Given:  Drink Fluids:  Drink several glasses of fruit juice, other clear fluids, or water. This will improve hydration and blood glucose. If you have a fever or have had heat exposure, make sure the fluids are cold.  Stand Up Slowly:  In the mornings, sit up for a few minutes before you stand up. That will help your blood flow make the adjustment.  Sit down or lie down if you feel dizzy.  Call Back If:  Still feel dizzy after 2 hours of rest and fluids  Passes out (faints)  You become worse.  Call Back If:  You become worse.  RN Overrode Recommendation:  Go To ED  Per Lurline Hare, in office.

## 2014-10-25 NOTE — ED Notes (Signed)
Pt in CT.

## 2014-10-25 NOTE — ED Notes (Addendum)
Pt reports hypertension last night at home in the 170's and took her BP pills. This am she was in the 90's. Pt says that she is having "burning and tingling" in her head and is just not feeling right. Pt c/o of nausea and pressure headache.  Pt in NAD and has had a recent MRI for the same symptoms.

## 2014-11-04 ENCOUNTER — Telehealth: Payer: Self-pay | Admitting: *Deleted

## 2014-11-04 NOTE — Telephone Encounter (Signed)
Patient requested a copy of notes to be faxed to [facility] pain [phone]

## 2014-11-04 NOTE — Telephone Encounter (Signed)
Patient would like to come by today to pick up records to take with her to an appointment on tomorrow please call when they are available  Call back number [phone] or [phone]

## 2014-12-22 ENCOUNTER — Other Ambulatory Visit: Payer: Self-pay | Admitting: Family Medicine

## 2015-01-14 DIAGNOSIS — R51 Headache: Secondary | ICD-10-CM | POA: Diagnosis not present

## 2015-01-20 ENCOUNTER — Ambulatory Visit: Payer: Medicare Other | Admitting: Family Medicine

## 2015-01-27 ENCOUNTER — Ambulatory Visit (INDEPENDENT_AMBULATORY_CARE_PROVIDER_SITE_OTHER): Payer: Medicare Other | Admitting: Family Medicine

## 2015-01-27 ENCOUNTER — Encounter: Payer: Self-pay | Admitting: Family Medicine

## 2015-01-27 VITALS — BP 134/78 | HR 81 | Temp 97.5°F | Ht 62.0 in | Wt 135.4 lb

## 2015-01-27 DIAGNOSIS — I10 Essential (primary) hypertension: Secondary | ICD-10-CM | POA: Diagnosis not present

## 2015-01-27 DIAGNOSIS — F411 Generalized anxiety disorder: Secondary | ICD-10-CM

## 2015-01-27 DIAGNOSIS — G609 Hereditary and idiopathic neuropathy, unspecified: Secondary | ICD-10-CM | POA: Diagnosis not present

## 2015-01-27 NOTE — Progress Notes (Signed)
Pre visit review using our clinic review tool, if applicable. No additional management support is needed unless otherwise documented below in the visit note. 

## 2015-01-27 NOTE — Progress Notes (Signed)
   Subjective:    Patient ID: Deborah Barton, female    DOB: [birth year], 69 y.o.   MRN: [id number]  HPI Deborah Barton is a 69 year old female who comes in today for evaluation of multiple problems  She has a history of underlying hypertension and currently takes Zestoretic 10-12 0.5 daily for blood pressure control. She states she's not taking her blood pressure medicine. BP here today 134/78  She self-referred to [facility] after repeated evaluations here in [place]. She saw a neurologist there who diagnosed her with her neuropathy of unknown etiology and put her on gabapentin 200 mg during the day and 300 mg at bedtime. She says this is helped her symptoms. We also recommend she see a therapist because of her anxiety.  She takes Ativan 0.5 at bedtime as needed for anxiety however I explained to her this is only a short-term Band-Aid. I will refer her Dr. Caprice Beaver in group for further evaluation and therapy. Do not recommend long-term tranquilizers   Review of Systems Review of systems otherwise negative,,,,,,,,,,, she wants to transfer to Dr. Bunnie Pion at [place],,,,, offered she could stay here and see one of our new doctors however she declines    Objective:   Physical Exam  Well-developed well-nourished female no acute distress does not appears stated age of 82.      Assessment & Plan:  Neuropathy etiology unknown,,,,,,,, continue gabapentin and follow-up by [facility] she declines to see a neurologist here  Anxiety,,,,,,,,,, Ativan 0.5 daily at bedtime..... Short-term Band-Aid.....Marland Kitchen consult with Dr. Caprice Beaver in group  History of hypertension off medication BP check daily follow-up if elevated

## 2015-01-27 NOTE — Patient Instructions (Addendum)
Continue the medicine that the physician at [facility] prescribed and follow up with them as outlined  Ativan 0.5,,,,,,,,,, 1 at bedtime when necessary  Call Dr. Letta Moynahan in group phone number [phone]  Call GI for follow-up colonoscopy,,,,, [phone]  Okay to establish with Dr. Dennie Fetters off or see our new nurse practitioner Georgina Snell who will be joining [place] in April  Check your blood pressure daily in the morning and keep a record of all your blood pressure readings

## 2015-02-11 ENCOUNTER — Telehealth: Payer: Self-pay | Admitting: Family Medicine

## 2015-02-11 NOTE — Telephone Encounter (Signed)
error 

## 2015-02-19 ENCOUNTER — Telehealth: Payer: Self-pay | Admitting: Family Medicine

## 2015-02-19 NOTE — Telephone Encounter (Signed)
Patient asked we call her cell # re: xfer to dr Alain Marion

## 2015-02-25 ENCOUNTER — Ambulatory Visit (INDEPENDENT_AMBULATORY_CARE_PROVIDER_SITE_OTHER): Payer: Medicare Other | Admitting: Internal Medicine

## 2015-02-25 ENCOUNTER — Encounter: Payer: Self-pay | Admitting: Internal Medicine

## 2015-02-25 VITALS — BP 122/80 | HR 78 | Temp 97.6°F | Resp 12 | Ht 62.0 in | Wt 133.8 lb

## 2015-02-25 DIAGNOSIS — F411 Generalized anxiety disorder: Secondary | ICD-10-CM | POA: Diagnosis not present

## 2015-02-25 DIAGNOSIS — G43109 Migraine with aura, not intractable, without status migrainosus: Secondary | ICD-10-CM | POA: Diagnosis not present

## 2015-02-25 DIAGNOSIS — B351 Tinea unguium: Secondary | ICD-10-CM

## 2015-02-25 MED ORDER — DULOXETINE HCL 30 MG PO CPEP: 30.0000 mg | ORAL_CAPSULE | Freq: Every day | ORAL | Status: DC

## 2015-02-25 MED ORDER — GABAPENTIN 300 MG PO CAPS: 300.0000 mg | ORAL_CAPSULE | Freq: Three times a day (TID) | ORAL | Status: DC | PRN

## 2015-02-25 NOTE — Progress Notes (Signed)
Pre visit review using our clinic review tool, if applicable. No additional management support is needed unless otherwise documented below in the visit note. 

## 2015-02-25 NOTE — Patient Instructions (Signed)
We will have you start taking a medicine called duloxetine for the anxiety which you take everyday. It takes 2-4 weeks to get in your system well but should help with the moods going up and down. It may even help a little with the pain in the scalp. This is the starting dose and if we need we can increase it to the full dose in 1 month if it helps some but not all the way.   I have sent in the stronger pills of the gabapentin. It is 300 mg in each pill, so you can take 1 pill up to 3 times a day for the burning.   We do not need to take any blood work today. We will see you back in about 3-6 months to check on you. If you have any problems or questions before then please call us.   Call us in about 3 weeks and let us know if we should keep you on the low dose of the duloxetine or whether we need to go to the full dose.

## 2015-02-26 ENCOUNTER — Telehealth: Payer: Self-pay | Admitting: Internal Medicine

## 2015-02-26 MED ORDER — TERBINAFINE HCL 250 MG PO TABS: 250.0000 mg | ORAL_TABLET | Freq: Every day | ORAL | Status: DC

## 2015-02-26 NOTE — Telephone Encounter (Signed)
Patient said she was in yesterday and was suppose to be given a prescription for her toe infection but there wasn't a prescription for it at the pharmacist.  She would like to have this prescription called into [facility] on [address].

## 2015-02-26 NOTE — Telephone Encounter (Signed)
Left message informing patient she can go pick up prescription at the pharmacy.

## 2015-02-26 NOTE — Telephone Encounter (Signed)
It has been sent in, lamisil 1 pill daily for 3 months.

## 2015-02-27 DIAGNOSIS — B351 Tinea unguium: Secondary | ICD-10-CM | POA: Insufficient documentation

## 2015-02-27 NOTE — Progress Notes (Signed)
   Subjective:    Patient ID: Deborah Barton, female    DOB: 1946/02/03, 69 y.o.   MRN: [id number]  HPI The patient is a 69 YO female who is coming in for burning in her scalp. She is having it all the time and it is 6/10 severity. She has seen a neurologist who gave her gabapentin but no explanation. This has helped some but not enough. It keeps her up at night, nothing makes it better or worse. She has not tried other things. Started within the last 4-5 months.   Review of Systems  Constitutional: Negative for fever, activity change, appetite change, fatigue and unexpected weight change.  Respiratory: Negative for cough, chest tightness, shortness of breath and wheezing.   Cardiovascular: Negative for chest pain, palpitations and leg swelling.  Gastrointestinal: Negative for abdominal pain, diarrhea, constipation and abdominal distention.  Skin: Positive for color change.       Nails discolored  Neurological: Positive for headaches. Negative for weakness and numbness.       Scalp burning and pain      Objective:   Physical Exam  Constitutional: She appears well-developed and well-nourished.  HENT:  Head: Normocephalic and atraumatic.  No rash on the scalp but diffusely tender to touch  Eyes: EOM are normal.  Neck: Normal range of motion.  Cardiovascular: Normal rate and regular rhythm.   Pulmonary/Chest: Effort normal and breath sounds normal.  Abdominal: Soft. Bowel sounds are normal.  Neurological: Coordination normal.  Skin:  Right great toenail with fungus.    Filed Vitals:   02/25/15 1037  BP: 122/80  Pulse: 78  Temp: 97.6 F (36.4 C)  TempSrc: Oral  Resp: 12  Height: 5\' 2"  (1.575 m)  Weight: 133 lb 12.8 oz (60.691 kg)  SpO2: 98%      Assessment & Plan:

## 2015-02-27 NOTE — Assessment & Plan Note (Signed)
Lamisil daily for 3 months and advised that the toenail would have to grow out fully until we will know if it is gone.

## 2015-02-27 NOTE — Assessment & Plan Note (Signed)
Unclear if this head symptoms could be migraine as she is having some associated nausea and the scalp sensation. Increase her gabapentin to 300 mg TID prn pain.

## 2015-02-27 NOTE — Assessment & Plan Note (Signed)
Start cymbalta 30 mg daily in the hopes that this helps with the scalp sensation as well as this.

## 2015-03-12 ENCOUNTER — Other Ambulatory Visit: Payer: Self-pay | Admitting: Internal Medicine

## 2015-03-12 DIAGNOSIS — Z1231 Encounter for screening mammogram for malignant neoplasm of breast: Secondary | ICD-10-CM

## 2015-03-14 ENCOUNTER — Other Ambulatory Visit: Payer: Self-pay | Admitting: Geriatric Medicine

## 2015-03-14 ENCOUNTER — Telehealth: Payer: Self-pay | Admitting: Internal Medicine

## 2015-03-14 MED ORDER — GABAPENTIN 300 MG PO CAPS: 300.0000 mg | ORAL_CAPSULE | Freq: Three times a day (TID) | ORAL | Status: DC | PRN

## 2015-03-14 MED ORDER — DULOXETINE HCL 30 MG PO CPEP: 30.0000 mg | ORAL_CAPSULE | Freq: Every day | ORAL | Status: DC

## 2015-03-14 NOTE — Telephone Encounter (Signed)
Sent to pharmacy 

## 2015-03-14 NOTE — Telephone Encounter (Signed)
Pt called in and said that the meds were helping her head from burning and her temperament.  She would like a refill    [phone]

## 2015-03-18 ENCOUNTER — Telehealth: Payer: Self-pay | Admitting: Internal Medicine

## 2015-03-18 ENCOUNTER — Ambulatory Visit (HOSPITAL_COMMUNITY)
Admission: RE | Admit: 2015-03-18 | Discharge: 2015-03-18 | Disposition: A | Discharge status: Home / Self Care | Payer: Medicare Other | Source: Ambulatory Visit | Attending: Internal Medicine | Admitting: Internal Medicine

## 2015-03-18 ENCOUNTER — Other Ambulatory Visit: Payer: Self-pay | Admitting: Geriatric Medicine

## 2015-03-18 DIAGNOSIS — Z1231 Encounter for screening mammogram for malignant neoplasm of breast: Secondary | ICD-10-CM | POA: Insufficient documentation

## 2015-03-18 MED ORDER — GABAPENTIN 300 MG PO CAPS: 300.0000 mg | ORAL_CAPSULE | Freq: Three times a day (TID) | ORAL | Status: DC | PRN

## 2015-03-18 MED ORDER — DULOXETINE HCL 30 MG PO CPEP: 30.0000 mg | ORAL_CAPSULE | Freq: Every day | ORAL | Status: DC

## 2015-03-18 NOTE — Telephone Encounter (Signed)
Had questions in regards to scripts sent

## 2015-03-18 NOTE — Telephone Encounter (Signed)
Tried to reach patient. No answer.  

## 2015-03-18 NOTE — Telephone Encounter (Signed)
Patient states pharmacy states they have not received refill on duloxetine.  Please resend.  Patient states this med has really helped her with her head burns.   Also, would like to stay on gabapentin 300mg .  Is requesting continued refills.

## 2015-03-18 NOTE — Telephone Encounter (Signed)
Sent to pharmacy 

## 2015-03-24 ENCOUNTER — Other Ambulatory Visit: Payer: Self-pay | Admitting: Internal Medicine

## 2015-04-03 ENCOUNTER — Ambulatory Visit (INDEPENDENT_AMBULATORY_CARE_PROVIDER_SITE_OTHER)
Admission: RE | Admit: 2015-04-03 | Discharge: 2015-04-03 | Disposition: A | Discharge status: Home / Self Care | Payer: Medicare Other | Source: Ambulatory Visit | Attending: Internal Medicine | Admitting: Internal Medicine

## 2015-04-03 ENCOUNTER — Encounter: Payer: Self-pay | Admitting: Internal Medicine

## 2015-04-03 ENCOUNTER — Ambulatory Visit (INDEPENDENT_AMBULATORY_CARE_PROVIDER_SITE_OTHER): Payer: Medicare Other | Admitting: Internal Medicine

## 2015-04-03 VITALS — BP 110/68 | HR 81 | Temp 98.2°F | Resp 12 | Wt 131.1 lb

## 2015-04-03 DIAGNOSIS — D869 Sarcoidosis, unspecified: Secondary | ICD-10-CM

## 2015-04-03 DIAGNOSIS — G609 Hereditary and idiopathic neuropathy, unspecified: Secondary | ICD-10-CM

## 2015-04-03 DIAGNOSIS — H538 Other visual disturbances: Secondary | ICD-10-CM | POA: Diagnosis not present

## 2015-04-03 MED ORDER — DULOXETINE HCL 60 MG PO CPEP: 60.0000 mg | ORAL_CAPSULE | Freq: Every day | ORAL | Status: DC

## 2015-04-03 NOTE — Patient Instructions (Signed)
We will have you increase the duloxetine to 2 pills a day until that bottle is gone starting on the 12th. I have sent in today a 60 mg pill so when you pick that up you can take 1 pill a day of that. This may do better for some of the symptoms including the head pain. If it does not work you can try the acupuncture.  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

## 2015-04-03 NOTE — Progress Notes (Signed)
Pre visit review using our clinic review tool, if applicable. No additional management support is needed unless otherwise documented below in the visit note. 

## 2015-04-04 NOTE — Assessment & Plan Note (Signed)
Doing better on cymbalta but still some episodes. Will increase cymbalta to 60 mg daily to see if she gets more benefit from this. Do not think that the high blood pressure during the episode is significant.

## 2015-04-04 NOTE — Progress Notes (Signed)
   Subjective:    Patient ID: Deborah Barton, female    DOB: [birth year], 69 y.o.   MRN: [id number]  HPI The patient is a 69 YO female who comes in to follow up on her head pain. Since starting cymbalta she has had less episodes of burning on her head. She had one the other night and then took her blood pressure which was mildly elevated (150s/80s). She has checked it again since that time and not had it to be high. She is not having the pain now. It is 5/10 when it comes and lasts anywhere from minutes to hours. Lately lasting less long since cymbalta.   Review of Systems  Constitutional: Negative for fever, activity change, appetite change, fatigue and unexpected weight change.  Respiratory: Negative for cough, chest tightness, shortness of breath and wheezing.   Cardiovascular: Negative for chest pain, palpitations and leg swelling.  Gastrointestinal: Negative for abdominal pain, diarrhea, constipation and abdominal distention.  Neurological: Positive for headaches. Negative for weakness and numbness.       Scalp burning and pain      Objective:   Physical Exam  Constitutional: She appears well-developed and well-nourished.  HENT:  Head: Normocephalic and atraumatic.  No rash on the scalp and not tender to touch  Eyes: EOM are normal.  Neck: Normal range of motion.  Cardiovascular: Normal rate and regular rhythm.   Pulmonary/Chest: Effort normal and breath sounds normal.  Abdominal: Soft. Bowel sounds are normal.  Neurological: Coordination normal.  Skin:  Gold toenail polish   Filed Vitals:   04/03/15 1511  BP: 110/68  Pulse: 81  Temp: 98.2 F (36.8 C)  TempSrc: Oral  Resp: 12  Weight: 131 lb 1.9 oz (59.476 kg)  SpO2: 94%      Assessment & Plan:

## 2015-04-15 ENCOUNTER — Telehealth: Payer: Self-pay | Admitting: Internal Medicine

## 2015-04-15 ENCOUNTER — Emergency Department (HOSPITAL_COMMUNITY)
Admission: EM | Admit: 2015-04-15 | Discharge: 2015-04-15 | Disposition: A | Discharge status: Home / Self Care | Payer: Medicare Other | Attending: Emergency Medicine | Admitting: Emergency Medicine

## 2015-04-15 ENCOUNTER — Encounter (HOSPITAL_COMMUNITY): Payer: Self-pay

## 2015-04-15 DIAGNOSIS — I10 Essential (primary) hypertension: Secondary | ICD-10-CM | POA: Insufficient documentation

## 2015-04-15 DIAGNOSIS — R51 Headache: Secondary | ICD-10-CM | POA: Diagnosis present

## 2015-04-15 DIAGNOSIS — Z79899 Other long term (current) drug therapy: Secondary | ICD-10-CM | POA: Insufficient documentation

## 2015-04-15 DIAGNOSIS — H9313 Tinnitus, bilateral: Secondary | ICD-10-CM | POA: Insufficient documentation

## 2015-04-15 MED ORDER — MECLIZINE HCL 25 MG PO TABS: 25.0000 mg | ORAL_TABLET | Freq: Four times a day (QID) | ORAL | Status: DC | PRN

## 2015-04-15 MED ORDER — LORAZEPAM 1 MG PO TABS: 1.0000 mg | ORAL_TABLET | Freq: Two times a day (BID) | ORAL | Status: DC | PRN

## 2015-04-15 NOTE — ED Notes (Addendum)
Pt states "buzzing noise" in head.  Slight headache with noise.  Some change with changing position.  Pt states also burning sensation history which is different from this.  Slight dizzy with the "burning sensation"  Pt denies hearing voices

## 2015-04-15 NOTE — ED Provider Notes (Signed)
CSN: [id number]     Arrival date & time 04/15/15  1245 History   First MD Initiated Contact with Patient 04/15/15 1506     Chief Complaint  Patient presents with  . Headache     (Consider location/radiation/quality/duration/timing/severity/associated sxs/prior Treatment) HPI.... Complains of buzzing noise in both ears for 4 days with associated nausea. Patient has a history of headaches located on the scalp described as a burning pain. No neurological deficits, stiff neck, fever, chills, hearing disability. She has taking nothing for her symptoms. Severity is mild to moderate.  Past Medical History  Diagnosis Date  . Hypertension    Past Surgical History  Procedure Laterality Date  . Abdominal hysterectomy    . Cholecystectomy     Family History  Problem Relation Age of Onset  . Anemia Other   . Arthritis Other   . Asthma Other   . Depression Other   . Hypertension Other   . Stroke Other    History  Substance Use Topics  . Smoking status: Never Smoker   . Smokeless tobacco: Not on file  . Alcohol Use: Yes     Comment: wine   OB History    No data available     Review of Systems  All other systems reviewed and are negative.     Allergies  Review of patient's allergies indicates no known allergies.  Home Medications   Prior to Admission medications   Medication Sig Start Date End Date Taking? Authorizing Provider  Ascorbic Acid (VITAMIN C) 1000 MG tablet Take 1,000 mg by mouth daily.   Yes Historical Provider, MD  Aspirin-Acetaminophen-Caffeine (EXCEDRIN PO) Take 2 tablets by mouth daily as needed (headache).   Yes Historical Provider, MD  cholecalciferol (VITAMIN D) 1000 UNITS tablet Take 1,000 Units by mouth daily.   Yes Historical Provider, MD  DULoxetine (CYMBALTA) 30 MG capsule Take 30 mg by mouth daily.   Yes Historical Provider, MD  gabapentin (NEURONTIN) 300 MG capsule Take 1 capsule (300 mg total) by mouth 3 (three) times daily as needed. Take two  tablets by mouth 3 times daily 03/18/15  Yes Olga Millers, MD  ibuprofen (ADVIL,MOTRIN) 200 MG tablet Take 400 mg by mouth every 6 (six) hours as needed for headache.    Yes Historical Provider, MD  Omega-3 Fatty Acids (FISH OIL PO) Take 1 capsule by mouth daily.   Yes Historical Provider, MD  terbinafine (LAMISIL) 250 MG tablet Take 1 tablet (250 mg total) by mouth daily. 02/26/15  Yes Olga Millers, MD  vitamin E 400 UNIT capsule Take 400 Units by mouth daily.   Yes Historical Provider, MD  DULoxetine (CYMBALTA) 60 MG capsule Take 1 capsule (60 mg total) by mouth daily. Patient not taking: Reported on 04/15/2015 04/03/15   Olga Millers, MD  LORazepam (ATIVAN) 1 MG tablet Take 1 tablet (1 mg total) by mouth 2 (two) times daily as needed for anxiety. 04/15/15   Nat Christen, MD  meclizine (ANTIVERT) 25 MG tablet Take 1 tablet (25 mg total) by mouth every 6 (six) hours as needed for dizziness. 04/15/15   Nat Christen, MD   BP 138/75 mmHg  Pulse 73  Temp(Src) 97.6 F (36.4 C) (Oral)  Resp 18  SpO2 99% Physical Exam  Constitutional: She is oriented to person, place, and time. She appears well-developed and well-nourished.  HENT:  Head: Normocephalic and atraumatic.  Eyes: Conjunctivae and EOM are normal. Pupils are equal, round, and reactive to light.  Neck:  Normal range of motion. Neck supple.  Cardiovascular: Normal rate and regular rhythm.   Pulmonary/Chest: Effort normal and breath sounds normal.  Abdominal: Soft. Bowel sounds are normal.  Musculoskeletal: Normal range of motion.  Neurological: She is alert and oriented to person, place, and time.  Skin: Skin is warm and dry.  Psychiatric: She has a normal mood and affect. Her behavior is normal.  Nursing note and vitals reviewed.   ED Course  Procedures (including critical care time) Labs Review Labs Reviewed - No data to display  Imaging Review No results found.   EKG Interpretation None      MDM   Final  diagnoses:  Tinnitus, bilateral    Patient hasn't absolute normal exam. Will Rx Antivert 25 mg and Ativan 1 mg. Referral to otolaryngology.    Nat Christen, MD 04/15/15 [phone]

## 2015-04-15 NOTE — Telephone Encounter (Signed)
Patient Name: Deborah Barton DOB: 08-17-1946 Initial Comment Caller states she woke up in middle of night and heard a buzzing noise. It is a faint nosey in her head. Nurse Assessment Nurse: Vallery Sa, RN, Cathy Date/Time (Eastern Time): 04/15/2015 10:27:07 AM Confirm and document reason for call. If symptomatic, describe symptoms. ---Caller states she woke up last night and heard a buzzing sound that is still present today. No fever. Nothing sharp inserted into her ear canals in the past 3 days. She hit her head on a truck yesterday. Has the patient traveled out of the country within the last 30 days? ---No Does the patient require triage? ---Yes Related visit to physician within the last 2 weeks? ---No Does the PT have any chronic conditions? (i.e. diabetes, asthma, etc.) ---Yes List chronic conditions. ---Burning sensation on her head (started September 2015) Guidelines Guideline Title Affirmed Question Affirmed Notes Head Injury [1] Loss of vision or double vision AND [2] present now loss of vision Final Disposition User Go to ED Now Vallery Sa, RN, [employer] plans to go to [facility] [employer] .

## 2015-04-15 NOTE — Discharge Instructions (Signed)
Tinnitus Sounds you hear in your ears and coming from within the ear is called tinnitus. This can be a symptom of many ear disorders. It is often associated with hearing loss.  Tinnitus can be seen with:  Infections.  Ear blockages such as wax buildup.  Meniere's disease.  Ear damage.  Inherited.  Occupational causes. While irritating, it is not usually a threat to health. When the cause of the tinnitus is wax, infection in the middle ear, or foreign body it is easily treated. Hearing loss will usually be reversible.  TREATMENT  When treating the underlying cause does not get rid of tinnitus, it may be necessary to get rid of the unwanted sound by covering it up with more pleasant background noises. This may include music, the radio etc. There are tinnitus maskers which can be worn which produce background noise to cover up the tinnitus. Avoid all medications which tend to make tinnitus worse such as alcohol, caffeine, aspirin, and nicotine. There are many soothing background tapes such as rain, ocean, thunderstorms, etc. These soothing sounds help with sleeping or resting. Keep all follow-up appointments and referrals. This is important to identify the cause of the problem. It also helps avoid complications, impaired hearing, disability, or chronic pain. Document Released: 12/13/2005 Document Revised: 03/06/2012 Document Reviewed: 07/31/2008 [facility] Patient Information 2015 [place], [place]. This information is not intended to replace advice given to you by your health care provider. Make sure you discuss any questions you have with your health care provider.   Medication for ear ringing and relaxation.  Follow up with ENT dr if not better.  Number given.

## 2015-04-16 ENCOUNTER — Telehealth: Payer: Self-pay | Admitting: *Deleted

## 2015-04-16 NOTE — Telephone Encounter (Signed)
[place] [facility] - Client [place] [facility] Patient Name: CHARDONAY SCRITCHFIELD Gender: Female DOB: September 12, 1946 Age: 69 Y 39 M 10 D Return Phone Number: [phone] (Primary), [phone] (Secondary) Address: [address] City/State/Zip: [place] [address] Client [facility] - Client Client Site [place] - Day Physician Terri Piedra Contact Type Call Call Type Triage / Clinical Relationship To Patient Self Appointment Disposition EMR Appointment Attempted - Not Scheduled Info pasted into Epic Yes Return Phone Number [phone] (Primary) Chief Complaint Head Injury (non urgent symptom) Initial Comment Caller states she woke up in middle of night and heard a buzzing noise. It is a faint nosey in her head. PreDisposition Call Doctor Nurse Assessment Nurse: Vallery Sa, RN, T[place] Date/Time Eilene Ghazi Time): 04/15/2015 10:27:07 AM Confirm and document reason for call. If symptomatic, describe symptoms. ---Caller states she woke up last night and heard a buzzing sound that is still present today. No fever. Nothing sharp inserted into her ear canals in the past 3 days. She hit her head on a truck yesterday. Has the patient traveled out of the country within the last 30 days? ---No Does the patient require triage? ---Yes Related visit to physician within the last 2 weeks? ---No Does the PT have any chronic conditions? (i.e. diabetes, asthma, etc.) ---Yes List chronic conditions. ---Burning sensation on her head (started September 2015) Guidelines Guideline Title Affirmed Question Affirmed Notes Nurse Date/Time (Eastern Time) Head Injury [1] Loss of vision or double vision AND [2] present now loss of vision [place], RN, Tye [place] 04/15/2015 10:30:44 AM Disp. Time Eilene Ghazi Time) Disposition Final User 04/15/2015 10:33:33 AM Go to ED Now Yes Vallery Sa, RN, Rosey [place] Understands: Yes PLEASE NOTE:  All timestamps contained within this report are represented as [place] Standard Time. CONFIDENTIALTY NOTICE: This fax transmission is intended only for the addressee. It contains information that is legally privileged, confidential or otherwise protected from use or disclosure. If you are not the intended recipient, you are strictly prohibited from reviewing, disclosing, copying using or disseminating any of this information or taking any action in reliance on or regarding this information. If you have received this fax in error, please notify us immediately by telephone so that we can arrange for its return to [place]. Phone: [phone], Toll-Free: [phone], Fax: [phone] Page: 2 of 2 Call Id: [id number] Disagree/Comply: Comply Care Advice Given Per Guideline GO TO ED NOW: You need to be seen in the Emergency Department. Go to the ER at ___________ [place] now. Drive carefully. DRIVING: Another adult should drive. CARE ADVICE given per Head Injury (Adult) guideline. After Care Instructions Given Call Event Type User Date / Time Description Comments User: Berton Mount, RN Date/Time Eilene Ghazi Time): 04/15/2015 10:35:04 AM Zhaniya plans to go to [employer] 

## 2015-04-30 ENCOUNTER — Other Ambulatory Visit: Payer: Self-pay | Admitting: Geriatric Medicine

## 2015-04-30 ENCOUNTER — Telehealth: Payer: Self-pay | Admitting: Internal Medicine

## 2015-04-30 NOTE — Telephone Encounter (Signed)
Patient wants a refill. I see that you only gave 20 pills last time. Is this medication something that you did not want this patient on long term? I was going to refill, but I wanted to check with you first.

## 2015-04-30 NOTE — Telephone Encounter (Signed)
Tried to reach patient. No answer and no voice mail. Will try again later.

## 2015-04-30 NOTE — Telephone Encounter (Signed)
Patient states she has seen DR. Kollar for ringing in her ear.  She went to the ER and they prescribed meclizine.  She states this works and she is out.  She is requesting a script to be sent to [facility] on [address].  Patient did not want to make an appointment or go back to the ER.  She also states she is not taking gabapentin anymore b/c she fells like it did not work.

## 2015-04-30 NOTE — Telephone Encounter (Signed)
Meclizine is for dizziness and should not be used routinely. It is available OTC if she wants to continue. If she is still having problems with dizziness she should come back in.

## 2015-05-01 NOTE — Telephone Encounter (Signed)
Patient called back about this. Please call patient on her cell.

## 2015-05-01 NOTE — Telephone Encounter (Signed)
Spoke with patient and she will call if she keeps feeling dizzy.

## 2015-05-05 DIAGNOSIS — H903 Sensorineural hearing loss, bilateral: Secondary | ICD-10-CM | POA: Diagnosis not present

## 2015-05-05 DIAGNOSIS — H9193 Unspecified hearing loss, bilateral: Secondary | ICD-10-CM | POA: Diagnosis not present

## 2015-05-05 DIAGNOSIS — H9313 Tinnitus, bilateral: Secondary | ICD-10-CM | POA: Diagnosis not present

## 2015-06-03 ENCOUNTER — Other Ambulatory Visit: Payer: Self-pay | Admitting: Internal Medicine

## 2015-06-04 NOTE — Telephone Encounter (Signed)
[facility]ax[facility] to [facility]

## 2015-07-01 ENCOUNTER — Telehealth: Payer: Self-pay | Admitting: Internal Medicine

## 2015-07-01 NOTE — Telephone Encounter (Signed)
Pharmacy received 2 different instructions for patients gabapentin (NEURONTIN) 300 MG capsule [[id number]]  Please call them with the correct instructions [facility] [address]

## 2015-07-02 ENCOUNTER — Other Ambulatory Visit: Payer: Self-pay | Admitting: Geriatric Medicine

## 2015-07-02 MED ORDER — GABAPENTIN 300 MG PO CAPS: 300.0000 mg | ORAL_CAPSULE | Freq: Three times a day (TID) | ORAL | Status: DC | PRN

## 2015-07-02 NOTE — Telephone Encounter (Signed)
Spoke with patient. She takes one by mouth three times daily.

## 2015-07-23 ENCOUNTER — Other Ambulatory Visit: Payer: Self-pay | Admitting: Internal Medicine

## 2015-07-27 ENCOUNTER — Other Ambulatory Visit: Payer: Self-pay | Admitting: Internal Medicine

## 2015-08-28 ENCOUNTER — Encounter: Payer: Self-pay | Admitting: Gastroenterology

## 2015-10-13 DIAGNOSIS — H2513 Age-related nuclear cataract, bilateral: Secondary | ICD-10-CM | POA: Diagnosis not present

## 2015-10-13 DIAGNOSIS — H35373 Puckering of macula, bilateral: Secondary | ICD-10-CM | POA: Diagnosis not present

## 2015-10-13 DIAGNOSIS — H40013 Open angle with borderline findings, low risk, bilateral: Secondary | ICD-10-CM | POA: Diagnosis not present

## 2015-10-13 DIAGNOSIS — H25013 Cortical age-related cataract, bilateral: Secondary | ICD-10-CM | POA: Diagnosis not present

## 2015-10-15 ENCOUNTER — Telehealth: Payer: Self-pay

## 2015-10-15 NOTE — Telephone Encounter (Signed)
Patient called to educate on Medicare Wellness apt. LVM for the patient to call back to educate and schedule for wellness visit.  Apt scheduled for CPE on 11/7 at 2:30

## 2015-10-16 ENCOUNTER — Encounter: Payer: Medicare Other | Admitting: Gastroenterology

## 2015-11-03 ENCOUNTER — Encounter: Payer: Self-pay | Admitting: Internal Medicine

## 2015-11-03 ENCOUNTER — Ambulatory Visit: Payer: Medicare Other

## 2015-11-03 ENCOUNTER — Ambulatory Visit (INDEPENDENT_AMBULATORY_CARE_PROVIDER_SITE_OTHER): Payer: Medicare Other | Admitting: Internal Medicine

## 2015-11-03 VITALS — BP 128/88 | HR 69 | Temp 98.1°F | Resp 20 | Ht 62.0 in | Wt 137.0 lb

## 2015-11-03 DIAGNOSIS — Z299 Encounter for prophylactic measures, unspecified: Secondary | ICD-10-CM

## 2015-11-03 DIAGNOSIS — R300 Dysuria: Secondary | ICD-10-CM | POA: Diagnosis not present

## 2015-11-03 DIAGNOSIS — Z23 Encounter for immunization: Secondary | ICD-10-CM | POA: Diagnosis not present

## 2015-11-03 DIAGNOSIS — Z0000 Encounter for general adult medical examination without abnormal findings: Secondary | ICD-10-CM | POA: Diagnosis not present

## 2015-11-03 LAB — POCT URINALYSIS DIPSTICK
Bilirubin, UA: NEGATIVE
Blood, UA: NEGATIVE
Glucose, UA: NEGATIVE
Ketones, UA: NEGATIVE
Leukocytes, UA: NEGATIVE
Nitrite, UA: NEGATIVE
Protein, UA: NEGATIVE
Spec Grav, UA: 1.01
Urobilinogen, UA: 0.2
pH, UA: 6

## 2015-11-03 MED ORDER — LORAZEPAM 1 MG PO TABS: 1.0000 mg | ORAL_TABLET | Freq: Every evening | ORAL | Status: DC | PRN

## 2015-11-03 MED ORDER — TERBINAFINE HCL 250 MG PO TABS: 250.0000 mg | ORAL_TABLET | Freq: Every day | ORAL | Status: DC

## 2015-11-03 NOTE — Patient Instructions (Signed)
We have given you the flu shot today.  We will have you come back fasting (before you eat) for blood work. Our lab is in the basement and opens at 7:30 AM and you do not need an appointment.   Health Maintenance, Female Adopting a healthy lifestyle and getting preventive care can go a long way to promote health and wellness. Talk with your health care provider about what schedule of regular examinations is right for you. This is a good chance for you to check in with your provider about disease prevention and staying healthy. In between checkups, there are plenty of things you can do on your own. Experts have done a lot of research about which lifestyle changes and preventive measures are most likely to keep you healthy. Ask your health care provider for more information. WEIGHT AND DIET  Eat a healthy diet  Be sure to include plenty of vegetables, fruits, low-fat dairy products, and lean protein.  Do not eat a lot of foods high in solid fats, added sugars, or salt.  Get regular exercise. This is one of the most important things you can do for your health.  Most adults should exercise for at least 150 minutes each week. The exercise should increase your heart rate and make you sweat (moderate-intensity exercise).  Most adults should also do strengthening exercises at least twice a week. This is in addition to the moderate-intensity exercise.  Maintain a healthy weight  Body mass index (BMI) is a measurement that can be used to identify possible weight problems. It estimates body fat based on height and weight. Your health care provider can help determine your BMI and help you achieve or maintain a healthy weight.  For females 63 years of age and older:   A BMI below 18.5 is considered underweight.  A BMI of 18.5 to 24.9 is normal.  A BMI of 25 to 29.9 is considered overweight.  A BMI of 30 and above is considered obese.  Watch levels of cholesterol and blood lipids  You should  start having your blood tested for lipids and cholesterol at 68 years of age, then have this test every 5 years.  You may need to have your cholesterol levels checked more often if:  Your lipid or cholesterol levels are high.  You are older than 68 years of age.  You are at high risk for heart disease.  CANCER SCREENING   Lung Cancer  Lung cancer screening is recommended for adults 56-11 years old who are at high risk for lung cancer because of a history of smoking.  A yearly low-dose CT scan of the lungs is recommended for people who:  Currently smoke.  Have quit within the past 15 years.  Have at least a 30-pack-year history of smoking. A pack year is smoking an average of one pack of cigarettes a day for 1 year.  Yearly screening should continue until it has been 15 years since you quit.  Yearly screening should stop if you develop a health problem that would prevent you from having lung cancer treatment.  Breast Cancer  Practice breast self-awareness. This means understanding how your breasts normally appear and feel.  It also means doing regular breast self-exams. Let your health care provider know about any changes, no matter how small.  If you are in your 20s or 30s, you should have a clinical breast exam (CBE) by a health care provider every 1-3 years as part of a regular health exam.  If you are 40 or older, have a CBE every year. Also consider having a breast X-ray (mammogram) every year.  If you have a family history of breast cancer, talk to your health care provider about genetic screening.  If you are at high risk for breast cancer, talk to your health care provider about having an MRI and a mammogram every year.  Breast cancer gene (BRCA) assessment is recommended for women who have family members with BRCA-related cancers. BRCA-related cancers include:  Breast.  Ovarian.  Tubal.  Peritoneal cancers.  Results of the assessment will determine the  need for genetic counseling and BRCA1 and BRCA2 testing. Cervical Cancer Your health care provider may recommend that you be screened regularly for cancer of the pelvic organs (ovaries, uterus, and vagina). This screening involves a pelvic examination, including checking for microscopic changes to the surface of your cervix (Pap test). You may be encouraged to have this screening done every 3 years, beginning at age 21.  For women ages 30-65, health care providers may recommend pelvic exams and Pap testing every 3 years, or they may recommend the Pap and pelvic exam, combined with testing for human papilloma virus (HPV), every 5 years. Some types of HPV increase your risk of cervical cancer. Testing for HPV may also be done on women of any age with unclear Pap test results.  Other health care providers may not recommend any screening for nonpregnant women who are considered low risk for pelvic cancer and who do not have symptoms. Ask your health care provider if a screening pelvic exam is right for you.  If you have had past treatment for cervical cancer or a condition that could lead to cancer, you need Pap tests and screening for cancer for at least 20 years after your treatment. If Pap tests have been discontinued, your risk factors (such as having a new sexual partner) need to be reassessed to determine if screening should resume. Some women have medical problems that increase the chance of getting cervical cancer. In these cases, your health care provider may recommend more frequent screening and Pap tests. Colorectal Cancer  This type of cancer can be detected and often prevented.  Routine colorectal cancer screening usually begins at 68 years of age and continues through 68 years of age.  Your health care provider may recommend screening at an earlier age if you have risk factors for colon cancer.  Your health care provider may also recommend using home test kits to check for hidden blood in  the stool.  A small camera at the end of a tube can be used to examine your colon directly (sigmoidoscopy or colonoscopy). This is done to check for the earliest forms of colorectal cancer.  Routine screening usually begins at age 50.  Direct examination of the colon should be repeated every 5-10 years through 68 years of age. However, you may need to be screened more often if early forms of precancerous polyps or small growths are found. Skin Cancer  Check your skin from head to toe regularly.  Tell your health care provider about any new moles or changes in moles, especially if there is a change in a mole's shape or color.  Also tell your health care provider if you have a mole that is larger than the size of a pencil eraser.  Always use sunscreen. Apply sunscreen liberally and repeatedly throughout the day.  Protect yourself by wearing long sleeves, pants, a wide-brimmed hat, and sunglasses whenever you   are outside. HEART DISEASE, DIABETES, AND HIGH BLOOD PRESSURE   High blood pressure causes heart disease and increases the risk of stroke. High blood pressure is more likely to develop in:  People who have blood pressure in the high end of the normal range (130-139/85-89 mm Hg).  People who are overweight or obese.  People who are African American.  If you are 18-39 years of age, have your blood pressure checked every 3-5 years. If you are 40 years of age or older, have your blood pressure checked every year. You should have your blood pressure measured twice--once when you are at a hospital or clinic, and once when you are not at a hospital or clinic. Record the average of the two measurements. To check your blood pressure when you are not at a hospital or clinic, you can use:  An automated blood pressure machine at a pharmacy.  A home blood pressure monitor.  If you are between 55 years and 79 years old, ask your health care provider if you should take aspirin to prevent  strokes.  Have regular diabetes screenings. This involves taking a blood sample to check your fasting blood sugar level.  If you are at a normal weight and have a low risk for diabetes, have this test once every three years after 68 years of age.  If you are overweight and have a high risk for diabetes, consider being tested at a younger age or more often. PREVENTING INFECTION  Hepatitis B  If you have a higher risk for hepatitis B, you should be screened for this virus. You are considered at high risk for hepatitis B if:  You were born in a country where hepatitis B is common. Ask your health care provider which countries are considered high risk.  Your parents were born in a high-risk country, and you have not been immunized against hepatitis B (hepatitis B vaccine).  You have HIV or AIDS.  You use needles to inject street drugs.  You live with someone who has hepatitis B.  You have had sex with someone who has hepatitis B.  You get hemodialysis treatment.  You take certain medicines for conditions, including cancer, organ transplantation, and autoimmune conditions. Hepatitis C  Blood testing is recommended for:  Everyone born from 1945 through 1965.  Anyone with known risk factors for hepatitis C. Sexually transmitted infections (STIs)  You should be screened for sexually transmitted infections (STIs) including gonorrhea and chlamydia if:  You are sexually active and are younger than 68 years of age.  You are older than 68 years of age and your health care provider tells you that you are at risk for this type of infection.  Your sexual activity has changed since you were last screened and you are at an increased risk for chlamydia or gonorrhea. Ask your health care provider if you are at risk.  If you do not have HIV, but are at risk, it may be recommended that you take a prescription medicine daily to prevent HIV infection. This is called pre-exposure prophylaxis  (PrEP). You are considered at risk if:  You are sexually active and do not regularly use condoms or know the HIV status of your partner(s).  You take drugs by injection.  You are sexually active with a partner who has HIV. Talk with your health care provider about whether you are at high risk of being infected with HIV. If you choose to begin PrEP, you should first be tested for   HIV. You should then be tested every 3 months for as long as you are taking PrEP.  PREGNANCY   If you are premenopausal and you may become pregnant, ask your health care provider about preconception counseling.  If you may become pregnant, take 400 to 800 micrograms (mcg) of folic acid every day.  If you want to prevent pregnancy, talk to your health care provider about birth control (contraception). OSTEOPOROSIS AND MENOPAUSE   Osteoporosis is a disease in which the bones lose minerals and strength with aging. This can result in serious bone fractures. Your risk for osteoporosis can be identified using a bone density scan.  If you are 74 years of age or older, or if you are at risk for osteoporosis and fractures, ask your health care provider if you should be screened.  Ask your health care provider whether you should take a calcium or vitamin D supplement to lower your risk for osteoporosis.  Menopause may have certain physical symptoms and risks.  Hormone replacement therapy may reduce some of these symptoms and risks. Talk to your health care provider about whether hormone replacement therapy is right for you.  HOME CARE INSTRUCTIONS   Schedule regular health, dental, and eye exams.  Stay current with your immunizations.   Do not use any tobacco products including cigarettes, chewing tobacco, or electronic cigarettes.  If you are pregnant, do not drink alcohol.  If you are breastfeeding, limit how much and how often you drink alcohol.  Limit alcohol intake to no more than 1 drink per day for  nonpregnant women. One drink equals 12 ounces of beer, 5 ounces of wine, or 1 ounces of hard liquor.  Do not use street drugs.  Do not share needles.  Ask your health care provider for help if you need support or information about quitting drugs.  Tell your health care provider if you often feel depressed.  Tell your health care provider if you have ever been abused or do not feel safe at home.   This information is not intended to replace advice given to you by your health care provider. Make sure you discuss any questions you have with your health care provider.   Document Released: 06/28/2011 Document Revised: 01/03/2015 Document Reviewed: 11/14/2013 Elsevier Interactive Patient Education [employer].

## 2015-11-03 NOTE — Progress Notes (Signed)
Pre visit review using our clinic review tool, if applicable. No additional management support is needed unless otherwise documented below in the visit note. 

## 2015-11-03 NOTE — Progress Notes (Signed)
   Subjective:    Patient ID: Deborah Barton, female    DOB: May 22, 1946, 69 y.o.   MRN: [id number]  HPI Here for medicare wellness, no new complaints. Please see A/P for status and treatment of chronic medical problems.   Diet: heart healthy Physical activity: active Depression/mood screen: negative Hearing: intact to whispered voice Visual acuity: grossly normal, performs annual eye exam  ADLs: capable Fall risk: none Home safety: good Cognitive evaluation: intact to orientation, naming, recall and repetition EOL planning: adv directives discussed  I have personally reviewed and have noted 1. The patient's medical and social history - reviewed today no changes 2. Their use of alcohol, tobacco or illicit drugs 3. Their current medications and supplements 4. The patient's functional ability including ADL's, fall risks, home safety risks and hearing or visual impairment. 5. Diet and physical activities 6. Evidence for depression or mood disorders 7. Care team reviewed and updated (available in snapshot)  Review of Systems  Constitutional: Negative for fever, activity change, appetite change, fatigue and unexpected weight change.  HENT: Negative.   Eyes: Negative.   Respiratory: Negative for cough, chest tightness, shortness of breath and wheezing.   Cardiovascular: Negative for chest pain, palpitations and leg swelling.  Gastrointestinal: Negative for abdominal pain, diarrhea, constipation and abdominal distention.  Musculoskeletal: Negative.   Neurological: Negative for weakness and numbness.  Psychiatric/Behavioral: Negative.       Objective:   Physical Exam  Constitutional: She appears well-developed and well-nourished.  HENT:  Head: Normocephalic and atraumatic.  No rash on the scalp and not tender to touch  Eyes: EOM are normal.  Neck: Normal range of motion.  Cardiovascular: Normal rate and regular rhythm.   Pulmonary/Chest: Effort normal and breath sounds  normal.  Abdominal: Soft. Bowel sounds are normal.  Neurological: Coordination normal.   Filed Vitals:   11/03/15 1442  BP: 128/88  Pulse: 69  Temp: 98.1 F (36.7 C)  TempSrc: Oral  Resp: 20  Height: 5\' 2"  (1.575 m)  Weight: 137 lb (62.143 kg)  SpO2: 98%      Assessment & Plan:  Flu shot given at visit.

## 2015-11-04 ENCOUNTER — Encounter: Payer: Self-pay | Admitting: Internal Medicine

## 2015-11-04 DIAGNOSIS — Z0000 Encounter for general adult medical examination without abnormal findings: Secondary | ICD-10-CM | POA: Insufficient documentation

## 2015-11-04 NOTE — Assessment & Plan Note (Signed)
Flu shot given at visit. Mammogram and tetanus up to date. Colonoscopy due for repeat and she does not want to do that right now. Checking labs, counseled her about regular exercise. Given 10 year screening recommendations.

## 2015-11-12 ENCOUNTER — Other Ambulatory Visit (INDEPENDENT_AMBULATORY_CARE_PROVIDER_SITE_OTHER): Payer: Medicare Other

## 2015-11-12 ENCOUNTER — Ambulatory Visit (AMBULATORY_SURGERY_CENTER): Payer: Self-pay | Admitting: *Deleted

## 2015-11-12 VITALS — Ht 62.0 in | Wt 139.6 lb

## 2015-11-12 DIAGNOSIS — I10 Essential (primary) hypertension: Secondary | ICD-10-CM

## 2015-11-12 DIAGNOSIS — Z0000 Encounter for general adult medical examination without abnormal findings: Secondary | ICD-10-CM | POA: Diagnosis not present

## 2015-11-12 DIAGNOSIS — Z1211 Encounter for screening for malignant neoplasm of colon: Secondary | ICD-10-CM

## 2015-11-12 LAB — CBC
HCT: 36.7 % (ref 36.0–46.0)
Hemoglobin: 11.5 g/dL — ABNORMAL LOW (ref 12.0–15.0)
MCHC: 31.5 g/dL (ref 30.0–36.0)
MCV: 72.5 fl — ABNORMAL LOW (ref 78.0–100.0)
Platelets: 303 10*3/uL (ref 150.0–400.0)
RBC: 5.06 Mil/uL (ref 3.87–5.11)
RDW: 16.4 % — ABNORMAL HIGH (ref 11.5–15.5)
WBC: 7.1 10*3/uL (ref 4.0–10.5)

## 2015-11-12 LAB — LIPID PANEL
Cholesterol: 196 mg/dL (ref 0–200)
HDL: 63.8 mg/dL (ref >39.00)
LDL Cholesterol: 121 mg/dL — ABNORMAL HIGH (ref 0–99)
NonHDL: 131.95
Total CHOL/HDL Ratio: 3
Triglycerides: 57 mg/dL (ref 0.0–149.0)
VLDL: 11.4 mg/dL (ref 0.0–40.0)

## 2015-11-12 LAB — COMPREHENSIVE METABOLIC PANEL
ALT: 13 U/L (ref 0–35)
AST: 19 U/L (ref 0–37)
Albumin: 4.4 g/dL (ref 3.5–5.2)
Alkaline Phosphatase: 82 U/L (ref 39–117)
BUN: 18 mg/dL (ref 6–23)
CO2: 30 mEq/L (ref 19–32)
Calcium: 9.9 mg/dL (ref 8.4–10.5)
Chloride: 102 mEq/L (ref 96–112)
Creatinine, Ser: 0.81 mg/dL (ref 0.40–1.20)
GFR: 90.45 mL/min (ref >60.00)
Glucose, Bld: 95 mg/dL (ref 70–99)
Potassium: 4.3 mEq/L (ref 3.5–5.1)
Sodium: 137 mEq/L (ref 135–145)
Total Bilirubin: 0.4 mg/dL (ref 0.2–1.2)
Total Protein: 7.5 g/dL (ref 6.0–8.3)

## 2015-11-12 MED ORDER — NA SULFATE-K SULFATE-MG SULF 17.5-3.13-1.6 GM/177ML PO SOLN: ORAL | Status: DC

## 2015-11-12 NOTE — Progress Notes (Signed)
No allergies to eggs or soy. No problems with anesthesia.  Pt given Emmi instructions for colonoscopy  No oxygen use  No diet drug use  

## 2015-11-19 ENCOUNTER — Encounter: Payer: Self-pay | Admitting: Gastroenterology

## 2015-11-27 ENCOUNTER — Ambulatory Visit: Payer: Medicare Other | Admitting: Family

## 2015-11-29 ENCOUNTER — Other Ambulatory Visit: Payer: Self-pay | Admitting: Internal Medicine

## 2015-12-03 ENCOUNTER — Encounter: Payer: Self-pay | Admitting: Gastroenterology

## 2015-12-03 ENCOUNTER — Ambulatory Visit (AMBULATORY_SURGERY_CENTER): Payer: Medicare Other | Admitting: Gastroenterology

## 2015-12-03 VITALS — BP 127/81 | HR 66 | Temp 97.2°F | Resp 18 | Ht 62.0 in | Wt 139.0 lb

## 2015-12-03 DIAGNOSIS — D123 Benign neoplasm of transverse colon: Secondary | ICD-10-CM | POA: Diagnosis not present

## 2015-12-03 DIAGNOSIS — D124 Benign neoplasm of descending colon: Secondary | ICD-10-CM | POA: Diagnosis not present

## 2015-12-03 DIAGNOSIS — D125 Benign neoplasm of sigmoid colon: Secondary | ICD-10-CM

## 2015-12-03 DIAGNOSIS — Z1211 Encounter for screening for malignant neoplasm of colon: Secondary | ICD-10-CM | POA: Diagnosis present

## 2015-12-03 MED ORDER — SODIUM CHLORIDE 0.9 % IV SOLN: 500.0000 mL | INTRAVENOUS | Status: DC

## 2015-12-03 NOTE — Op Note (Signed)
[facility]  [address] [place], [address]   COLONOSCOPY PROCEDURE REPORT  PATIENT: Deborah Barton, Deborah Barton  MR#: [id number] BIRTHDATE: 1946-06-01 , 30  yrs. old GENDER: female ENDOSCOPIST: Harl Bowie, MD REFERRED [id number] Crawford, M.D. PROCEDURE DATE:  12/03/2015 PROCEDURE:   Colonoscopy, screening, Colonoscopy with snare polypectomy, and Colonoscopy with cold biopsy polypectomy First Screening Colonoscopy - Avg.  risk and is 50 yrs.  old or older - No.  Prior Negative Screening - Now for repeat screening. 10 or more years since last screening  History of Adenoma - Now for follow-up colonoscopy & has been > or = to 3 yrs.  N/A  Polyps removed today? Yes ASA CLASS:   Class II INDICATIONS:Screening for colonic neoplasia and Colorectal Neoplasm Risk Assessment for this procedure is average risk. MEDICATIONS: Propofol 200 mg IV  DESCRIPTION OF PROCEDURE:   After the risks benefits and alternatives of the procedure were thoroughly explained, informed consent was obtained.  The digital rectal exam revealed no abnormalities of the rectum.   The LB [id number]  endoscope was introduced through the anus and advanced to the cecum, which was identified by both the appendix and ileocecal valve. No adverse events experienced.   The quality of the prep was good.  The instrument was then slowly withdrawn as the colon was fully examined. Estimated blood loss is zero unless otherwise noted in this procedure report.   COLON FINDINGS: Sessile polyps ranging between 3-70mm in size were found in the sigmoid colon(1) , descending colon (1), removed by cold snare and transverse colon (1) removed by cold biopsy forceps. The resection was complete, the polyp tissue was completely retrieved and sent to histology.  Retroflexed views revealed internal hemorrhoids. The time to cecum = 4.0 Withdrawal time = 8.1 The scope was withdrawn and the procedure  completed. COMPLICATIONS: There were no immediate complications.  ENDOSCOPIC IMPRESSION: Three sessile polyps removed  RECOMMENDATIONS: 1.  If the polyp(s) removed today are proven to be adenomatous (pre-cancerous) polyps, you will need a colonoscopy in 3 years. Otherwise you should continue to follow colorectal cancer screening guidelines for "routine risk" patients with a colonoscopy in 10 years.  You will receive a letter within 1-2 weeks with the results of your biopsy as well as final recommendations.  Please call my office if you have not received a letter after 3 weeks. 2.  Await pathology results  eSigned:  Harl Bowie, MD 12/03/2015 12:39 PM

## 2015-12-03 NOTE — Progress Notes (Signed)
Called to room to assist during endoscopic procedure.  Patient ID and intended procedure confirmed with present staff. Received instructions for my participation in the procedure from the performing physician.  

## 2015-12-03 NOTE — Patient Instructions (Signed)
YOU HAD AN ENDOSCOPIC PROCEDURE TODAY AT THE [facility]:   Refer to the procedure report that was given to you for any specific questions about what was found during the examination.  If the procedure report does not answer your questions, please call your gastroenterologist to clarify.  If you requested that your care partner not be given the details of your procedure findings, then the procedure report has been included in a sealed envelope for you to review at your convenience later.  YOU SHOULD EXPECT: Some feelings of bloating in the abdomen. Passage of more gas than usual.  Walking can help get rid of the air that was put into your GI tract during the procedure and reduce the bloating. If you had a lower endoscopy (such as a colonoscopy or flexible sigmoidoscopy) you may notice spotting of blood in your stool or on the toilet paper. If you underwent a bowel prep for your procedure, you may not have a normal bowel movement for a few days.  Please Note:  You might notice some irritation and congestion in your nose or some drainage.  This is from the oxygen used during your procedure.  There is no need for concern and it should clear up in a day or so.  SYMPTOMS TO REPORT IMMEDIATELY:   Following lower endoscopy (colonoscopy or flexible sigmoidoscopy):  Excessive amounts of blood in the stool  Significant tenderness or worsening of abdominal pains  Swelling of the abdomen that is new, acute  Fever of 100F or higher   For urgent or emergent issues, a gastroenterologist can be reached at any hour by calling [phone].   DIET: Your first meal following the procedure should be a small meal and then it is ok to progress to your normal diet. Heavy or fried foods are harder to digest and may make you feel nauseous or bloated.  Likewise, meals heavy in dairy and vegetables can increase bloating.  Drink plenty of fluids but you should avoid alcoholic beverages for 24  hours.  ACTIVITY:  You should plan to take it easy for the rest of today and you should NOT DRIVE or use heavy machinery until tomorrow (because of the sedation medicines used during the test).    FOLLOW UP: Our staff will call the number listed on your records the next business day following your procedure to check on you and address any questions or concerns that you may have regarding the information given to you following your procedure. If we do not reach you, we will leave a message.  However, if you are feeling well and you are not experiencing any problems, there is no need to return our call.  We will assume that you have returned to your regular daily activities without incident.  If any biopsies were taken you will be contacted by phone or by letter within the next 1-3 weeks.  Please call us at [phone] if you have not heard about the biopsies in 3 weeks.    SIGNATURES/CONFIDENTIALITY: You and/or your care partner have signed paperwork which will be entered into your electronic medical record.  These signatures attest to the fact that that the information above on your After Visit Summary has been reviewed and is understood.  Full responsibility of the confidentiality of this discharge information lies with you and/or your care-partner. 

## 2015-12-03 NOTE — Progress Notes (Signed)
A/ox3 pleased with MAC, report to Annette RN 

## 2015-12-04 ENCOUNTER — Telehealth: Payer: Self-pay

## 2015-12-04 NOTE — Telephone Encounter (Signed)
  Follow up Call-  Call back number 12/03/2015  Post procedure Call Back phone  # [phone] cell  Permission to leave phone message Yes     Patient questions:  Do you have a fever, pain , or abdominal swelling? No. Pain Score  0 *  Have you tolerated food without any problems? Yes.    Have you been able to return to your normal activities? Yes.    Do you have any questions about your discharge instructions: Diet   No. Medications  No. Follow up visit  No.  Do you have questions or concerns about your Care? No.  Actions: * If pain score is 4 or above: No action needed, pain <4.

## 2015-12-15 ENCOUNTER — Encounter: Payer: Self-pay | Admitting: Gastroenterology

## 2016-01-07 ENCOUNTER — Telehealth: Payer: Self-pay | Admitting: Gastroenterology

## 2016-01-07 NOTE — Telephone Encounter (Signed)
No answer no vm

## 2016-01-08 NOTE — Telephone Encounter (Signed)
Discussed the types of polyps she had removed, recall recommendations and healthy diet practices.

## 2016-04-30 ENCOUNTER — Telehealth: Payer: Self-pay | Admitting: Internal Medicine

## 2016-04-30 NOTE — Telephone Encounter (Signed)
Pt called request referral for bone density to be send to [facility], pt stated she never have one done before. Please help, do we have to wait for Dr. Sharlet Salina or other doc can do it for her? Please call pt back

## 2016-05-04 NOTE — Telephone Encounter (Signed)
Spoke with patient and she is due to come back for her 6 month follow up. She will talk to Dr. Sharlet Salina about the DEXA scan at her next appt.

## 2016-06-01 ENCOUNTER — Other Ambulatory Visit: Payer: Self-pay | Admitting: Internal Medicine

## 2016-06-01 DIAGNOSIS — Z1231 Encounter for screening mammogram for malignant neoplasm of breast: Secondary | ICD-10-CM

## 2016-06-14 ENCOUNTER — Ambulatory Visit
Admission: RE | Admit: 2016-06-14 | Discharge: 2016-06-14 | Disposition: A | Discharge status: Home / Self Care | Payer: Medicare Other | Source: Ambulatory Visit | Attending: Internal Medicine | Admitting: Internal Medicine

## 2016-06-14 DIAGNOSIS — Z1231 Encounter for screening mammogram for malignant neoplasm of breast: Secondary | ICD-10-CM

## 2016-09-13 ENCOUNTER — Telehealth: Payer: Self-pay | Admitting: Internal Medicine

## 2016-09-13 NOTE — Telephone Encounter (Signed)
Pt called in and needs refills on this med.  Can she get a refill on this?  Her last appt was 11/03/15 .  It was her CPE.  Can this be refill til time for her CPE again or does she need to be seen ?

## 2016-09-14 ENCOUNTER — Other Ambulatory Visit: Payer: Self-pay | Admitting: Geriatric Medicine

## 2016-09-14 MED ORDER — DULOXETINE HCL 60 MG PO CPEP: 60.0000 mg | ORAL_CAPSULE | Freq: Every day | ORAL | 3 refills | Status: DC

## 2016-09-14 NOTE — Telephone Encounter (Signed)
Sent to pharmacy 

## 2016-11-08 ENCOUNTER — Ambulatory Visit (INDEPENDENT_AMBULATORY_CARE_PROVIDER_SITE_OTHER): Payer: Medicare Other | Admitting: Internal Medicine

## 2016-11-08 ENCOUNTER — Encounter: Payer: Self-pay | Admitting: Internal Medicine

## 2016-11-08 ENCOUNTER — Other Ambulatory Visit (INDEPENDENT_AMBULATORY_CARE_PROVIDER_SITE_OTHER): Payer: Medicare Other

## 2016-11-08 VITALS — BP 138/70 | HR 79 | Temp 98.2°F | Resp 18 | Ht 62.0 in | Wt 137.0 lb

## 2016-11-08 DIAGNOSIS — Z23 Encounter for immunization: Secondary | ICD-10-CM | POA: Diagnosis not present

## 2016-11-08 DIAGNOSIS — G609 Hereditary and idiopathic neuropathy, unspecified: Secondary | ICD-10-CM

## 2016-11-08 DIAGNOSIS — F411 Generalized anxiety disorder: Secondary | ICD-10-CM | POA: Diagnosis not present

## 2016-11-08 DIAGNOSIS — Z0000 Encounter for general adult medical examination without abnormal findings: Secondary | ICD-10-CM

## 2016-11-08 DIAGNOSIS — E2839 Other primary ovarian failure: Secondary | ICD-10-CM

## 2016-11-08 DIAGNOSIS — I10 Essential (primary) hypertension: Secondary | ICD-10-CM

## 2016-11-08 LAB — LIPID PANEL
Cholesterol: 217 mg/dL — ABNORMAL HIGH (ref 0–200)
HDL: 70.1 mg/dL (ref >39.00)
LDL Cholesterol: 135 mg/dL — ABNORMAL HIGH (ref 0–99)
NonHDL: 146.82
Total CHOL/HDL Ratio: 3
Triglycerides: 58 mg/dL (ref 0.0–149.0)
VLDL: 11.6 mg/dL (ref 0.0–40.0)

## 2016-11-08 LAB — CBC
HCT: 35 % — ABNORMAL LOW (ref 36.0–46.0)
Hemoglobin: 11.4 g/dL — ABNORMAL LOW (ref 12.0–15.0)
MCHC: 32.4 g/dL (ref 30.0–36.0)
MCV: 71.8 fl — ABNORMAL LOW (ref 78.0–100.0)
Platelets: 311 10*3/uL (ref 150.0–400.0)
RBC: 4.88 Mil/uL (ref 3.87–5.11)
RDW: 16.1 % — ABNORMAL HIGH (ref 11.5–15.5)
WBC: 6.4 10*3/uL (ref 4.0–10.5)

## 2016-11-08 LAB — COMPREHENSIVE METABOLIC PANEL
ALT: 14 U/L (ref 0–35)
AST: 22 U/L (ref 0–37)
Albumin: 4.3 g/dL (ref 3.5–5.2)
Alkaline Phosphatase: 95 U/L (ref 39–117)
BUN: 15 mg/dL (ref 6–23)
CO2: 30 mEq/L (ref 19–32)
Calcium: 9.8 mg/dL (ref 8.4–10.5)
Chloride: 102 mEq/L (ref 96–112)
Creatinine, Ser: 0.82 mg/dL (ref 0.40–1.20)
GFR: 88.91 mL/min (ref >60.00)
Glucose, Bld: 76 mg/dL (ref 70–99)
Potassium: 3.9 mEq/L (ref 3.5–5.1)
Sodium: 138 mEq/L (ref 135–145)
Total Bilirubin: 0.6 mg/dL (ref 0.2–1.2)
Total Protein: 7.6 g/dL (ref 6.0–8.3)

## 2016-11-08 MED ORDER — LORAZEPAM 1 MG PO TABS: 1.0000 mg | ORAL_TABLET | Freq: Every evening | ORAL | 1 refills | Status: DC | PRN

## 2016-11-08 MED ORDER — DULOXETINE HCL 60 MG PO CPEP: 60.0000 mg | ORAL_CAPSULE | Freq: Every day | ORAL | 3 refills | Status: DC

## 2016-11-08 NOTE — Assessment & Plan Note (Signed)
Doing well with cymbalta and rare ativan usage. Refilled both today.

## 2016-11-08 NOTE — Progress Notes (Signed)
   Subjective:    Patient ID: Deborah Barton, female    DOB: 03-02-1946, 70 y.o.   MRN: [id number]  HPI Here for medicare wellness and physical, no new complaints. Please see A/P for status and treatment of chronic medical problems.   Diet: heart healthy Physical activity: exercise 3 times per week, works 3 days per week Depression/mood screen: negative Hearing: intact to whispered voice Visual acuity: grossly normal, performs annual eye exam  ADLs: capable Fall risk: none Home safety: good Cognitive evaluation: intact to orientation, naming, recall and repetition EOL planning: adv directives discussed, not in place has not discussed with family  I have personally reviewed and have noted 1. The patient's medical and social history - reviewed today no changes 2. Their use of alcohol, tobacco or illicit drugs 3. Their current medications and supplements 4. The patient's functional ability including ADL's, fall risks, home safety risks and hearing or visual impairment. 5. Diet and physical activities 6. Evidence for depression or mood disorders 7. Care team reviewed and updated (available in snapshot)  Review of Systems  Constitutional: Negative.   HENT: Negative.   Eyes: Negative.   Respiratory: Negative for cough, chest tightness and shortness of breath.   Cardiovascular: Negative for chest pain, palpitations and leg swelling.  Gastrointestinal: Negative for abdominal distention, abdominal pain, constipation, diarrhea, nausea and vomiting.  Musculoskeletal: Negative.   Skin: Negative.   Neurological: Negative.   Psychiatric/Behavioral: Negative.       Objective:   Physical Exam  Constitutional: She is oriented to person, place, and time. She appears well-developed and well-nourished.  HENT:  Head: Normocephalic and atraumatic.  Eyes: EOM are normal.  Neck: Normal range of motion.  Cardiovascular: Normal rate and regular rhythm.   Pulmonary/Chest: Effort normal and  breath sounds normal. No respiratory distress. She has no wheezes. She has no rales.  Abdominal: Soft. Bowel sounds are normal. She exhibits no distension. There is no tenderness. There is no rebound.  Musculoskeletal: She exhibits no edema.  Neurological: She is alert and oriented to person, place, and time. Coordination normal.  Skin: Skin is warm and dry.  Psychiatric: She has a normal mood and affect.   Vitals:   11/08/16 1029  BP: 138/70  Pulse: 79  Resp: 18  Temp: 98.2 F (36.8 C)  TempSrc: Oral  SpO2: 96%  Weight: 137 lb (62.1 kg)  Height: 5\' 2"  (1.575 m)      Assessment & Plan:  Flu shot given at visit.

## 2016-11-08 NOTE — Patient Instructions (Signed)
We are checking the labs and have given you the flu shot today.   Keep up the good job with your health.   Health Maintenance, Female Adopting a healthy lifestyle and getting preventive care can go a long way to promote health and wellness. Talk with your health care provider about what schedule of regular examinations is right for you. This is a good chance for you to check in with your provider about disease prevention and staying healthy. In between checkups, there are plenty of things you can do on your own. Experts have done a lot of research about which lifestyle changes and preventive measures are most likely to keep you healthy. Ask your health care provider for more information. WEIGHT AND DIET  Eat a healthy diet  Be sure to include plenty of vegetables, fruits, low-fat dairy products, and lean protein.  Do not eat a lot of foods high in solid fats, added sugars, or salt.  Get regular exercise. This is one of the most important things you can do for your health.  Most adults should exercise for at least 150 minutes each week. The exercise should increase your heart rate and make you sweat (moderate-intensity exercise).  Most adults should also do strengthening exercises at least twice a week. This is in addition to the moderate-intensity exercise.  Maintain a healthy weight  Body mass index (BMI) is a measurement that can be used to identify possible weight problems. It estimates body fat based on height and weight. Your health care provider can help determine your BMI and help you achieve or maintain a healthy weight.  For females 13 years of age and older:   A BMI below 18.5 is considered underweight.  A BMI of 18.5 to 24.9 is normal.  A BMI of 25 to 29.9 is considered overweight.  A BMI of 30 and above is considered obese.  Watch levels of cholesterol and blood lipids  You should start having your blood tested for lipids and cholesterol at 69 years of age, then have  this test every 5 years.  You may need to have your cholesterol levels checked more often if:  Your lipid or cholesterol levels are high.  You are older than 69 years of age.  You are at high risk for heart disease.  CANCER SCREENING   Lung Cancer  Lung cancer screening is recommended for adults 13-61 years old who are at high risk for lung cancer because of a history of smoking.  A yearly low-dose CT scan of the lungs is recommended for people who:  Currently smoke.  Have quit within the past 15 years.  Have at least a 30-pack-year history of smoking. A pack year is smoking an average of one pack of cigarettes a day for 1 year.  Yearly screening should continue until it has been 15 years since you quit.  Yearly screening should stop if you develop a health problem that would prevent you from having lung cancer treatment.  Breast Cancer  Practice breast self-awareness. This means understanding how your breasts normally appear and feel.  It also means doing regular breast self-exams. Let your health care provider know about any changes, no matter how small.  If you are in your 20s or 30s, you should have a clinical breast exam (CBE) by a health care provider every 1-3 years as part of a regular health exam.  If you are 61 or older, have a CBE every year. Also consider having a breast X-ray (  mammogram) every year.  If you have a family history of breast cancer, talk to your health care provider about genetic screening.  If you are at high risk for breast cancer, talk to your health care provider about having an MRI and a mammogram every year.  Breast cancer gene (BRCA) assessment is recommended for women who have family members with BRCA-related cancers. BRCA-related cancers include:  Breast.  Ovarian.  Tubal.  Peritoneal cancers.  Results of the assessment will determine the need for genetic counseling and BRCA1 and BRCA2 testing. Cervical Cancer Your health care  provider may recommend that you be screened regularly for cancer of the pelvic organs (ovaries, uterus, and vagina). This screening involves a pelvic examination, including checking for microscopic changes to the surface of your cervix (Pap test). You may be encouraged to have this screening done every 3 years, beginning at age 76.  For women ages 35-65, health care providers may recommend pelvic exams and Pap testing every 3 years, or they may recommend the Pap and pelvic exam, combined with testing for human papilloma virus (HPV), every 5 years. Some types of HPV increase your risk of cervical cancer. Testing for HPV may also be done on women of any age with unclear Pap test results.  Other health care providers may not recommend any screening for nonpregnant women who are considered low risk for pelvic cancer and who do not have symptoms. Ask your health care provider if a screening pelvic exam is right for you.  If you have had past treatment for cervical cancer or a condition that could lead to cancer, you need Pap tests and screening for cancer for at least 20 years after your treatment. If Pap tests have been discontinued, your risk factors (such as having a new sexual partner) need to be reassessed to determine if screening should resume. Some women have medical problems that increase the chance of getting cervical cancer. In these cases, your health care provider may recommend more frequent screening and Pap tests. Colorectal Cancer  This type of cancer can be detected and often prevented.  Routine colorectal cancer screening usually begins at 69 years of age and continues through 69 years of age.  Your health care provider may recommend screening at an earlier age if you have risk factors for colon cancer.  Your health care provider may also recommend using home test kits to check for hidden blood in the stool.  A small camera at the end of a tube can be used to examine your colon directly  (sigmoidoscopy or colonoscopy). This is done to check for the earliest forms of colorectal cancer.  Routine screening usually begins at age 30.  Direct examination of the colon should be repeated every 5-10 years through 69 years of age. However, you may need to be screened more often if early forms of precancerous polyps or small growths are found. Skin Cancer  Check your skin from head to toe regularly.  Tell your health care provider about any new moles or changes in moles, especially if there is a change in a mole's shape or color.  Also tell your health care provider if you have a mole that is larger than the size of a pencil eraser.  Always use sunscreen. Apply sunscreen liberally and repeatedly throughout the day.  Protect yourself by wearing long sleeves, pants, a wide-brimmed hat, and sunglasses whenever you are outside. HEART DISEASE, DIABETES, AND HIGH BLOOD PRESSURE   High blood pressure causes heart disease  and increases the risk of stroke. High blood pressure is more likely to develop in:  People who have blood pressure in the high end of the normal range (130-139/85-89 mm Hg).  People who are overweight or obese.  People who are African American.  If you are 5-58 years of age, have your blood pressure checked every 3-5 years. If you are 32 years of age or older, have your blood pressure checked every year. You should have your blood pressure measured twice--once when you are at a hospital or clinic, and once when you are not at a hospital or clinic. Record the average of the two measurements. To check your blood pressure when you are not at a hospital or clinic, you can use:  An automated blood pressure machine at a pharmacy.  A home blood pressure monitor.  If you are between 84 years and 54 years old, ask your health care provider if you should take aspirin to prevent strokes.  Have regular diabetes screenings. This involves taking a blood sample to check your  fasting blood sugar level.  If you are at a normal weight and have a low risk for diabetes, have this test once every three years after 69 years of age.  If you are overweight and have a high risk for diabetes, consider being tested at a younger age or more often. PREVENTING INFECTION  Hepatitis B  If you have a higher risk for hepatitis B, you should be screened for this virus. You are considered at high risk for hepatitis B if:  You were born in a country where hepatitis B is common. Ask your health care provider which countries are considered high risk.  Your parents were born in a high-risk country, and you have not been immunized against hepatitis B (hepatitis B vaccine).  You have HIV or AIDS.  You use needles to inject street drugs.  You live with someone who has hepatitis B.  You have had sex with someone who has hepatitis B.  You get hemodialysis treatment.  You take certain medicines for conditions, including cancer, organ transplantation, and autoimmune conditions. Hepatitis C  Blood testing is recommended for:  Everyone born from [birth year] through 1964.  Anyone with known risk factors for hepatitis C. Sexually transmitted infections (STIs)  You should be screened for sexually transmitted infections (STIs) including gonorrhea and chlamydia if:  You are sexually active and are younger than 69 years of age.  You are older than 69 years of age and your health care provider tells you that you are at risk for this type of infection.  Your sexual activity has changed since you were last screened and you are at an increased risk for chlamydia or gonorrhea. Ask your health care provider if you are at risk.  If you do not have HIV, but are at risk, it may be recommended that you take a prescription medicine daily to prevent HIV infection. This is called pre-exposure prophylaxis (PrEP). You are considered at risk if:  You are sexually active and do not regularly use condoms or  know the HIV status of your partner(s).  You take drugs by injection.  You are sexually active with a partner who has HIV. Talk with your health care provider about whether you are at high risk of being infected with HIV. If you choose to begin PrEP, you should first be tested for HIV. You should then be tested every 3 months for as long as you are taking PrEP.  PREGNANCY   If you are premenopausal and you may become pregnant, ask your health care provider about preconception counseling.  If you may become pregnant, take 400 to 800 micrograms (mcg) of folic acid every day.  If you want to prevent pregnancy, talk to your health care provider about birth control (contraception). OSTEOPOROSIS AND MENOPAUSE   Osteoporosis is a disease in which the bones lose minerals and strength with aging. This can result in serious bone fractures. Your risk for osteoporosis can be identified using a bone density scan.  If you are 31 years of age or older, or if you are at risk for osteoporosis and fractures, ask your health care provider if you should be screened.  Ask your health care provider whether you should take a calcium or vitamin D supplement to lower your risk for osteoporosis.  Menopause may have certain physical symptoms and risks.  Hormone replacement therapy may reduce some of these symptoms and risks. Talk to your health care provider about whether hormone replacement therapy is right for you.  HOME CARE INSTRUCTIONS   Schedule regular health, dental, and eye exams.  Stay current with your immunizations.   Do not use any tobacco products including cigarettes, chewing tobacco, or electronic cigarettes.  If you are pregnant, do not drink alcohol.  If you are breastfeeding, limit how much and how often you drink alcohol.  Limit alcohol intake to no more than 1 drink per day for nonpregnant women. One drink equals 12 ounces of beer, 5 ounces of wine, or 1 ounces of hard liquor.  Do  not use street drugs.  Do not share needles.  Ask your health care provider for help if you need support or information about quitting drugs.  Tell your health care provider if you often feel depressed.  Tell your health care provider if you have ever been abused or do not feel safe at home.   This information is not intended to replace advice given to you by your health care provider. Make sure you discuss any questions you have with your health care provider.   Document Released: 06/28/2011 Document Revised: 01/03/2015 Document Reviewed: 11/14/2013 Elsevier Interactive Patient Education [employer].

## 2016-11-08 NOTE — Assessment & Plan Note (Signed)
BP at goal on no meds. Better control since control of her neuropathy pain.

## 2016-11-08 NOTE — Assessment & Plan Note (Signed)
Given flu shot, shingles not done yet. Ordered dexa scan. Colonoscopy up to date, mammogram up to date. Pneumonia series complete and tetanus up to date. Counseled about sun safety, dangers of distracted driving, and the importance of talking to family about advanced directives. Given 10 year screening recommendations.

## 2016-11-08 NOTE — Assessment & Plan Note (Signed)
Not needing gabapentin anymore. Doing great on cymbalta and no more burning on the top of her head.

## 2016-11-08 NOTE — Progress Notes (Signed)
Pre visit review using our clinic review tool, if applicable. No additional management support is needed unless otherwise documented below in the visit note. 

## 2016-11-25 ENCOUNTER — Ambulatory Visit (INDEPENDENT_AMBULATORY_CARE_PROVIDER_SITE_OTHER)
Admission: RE | Admit: 2016-11-25 | Discharge: 2016-11-25 | Disposition: A | Discharge status: Home / Self Care | Payer: Medicare Other | Source: Ambulatory Visit | Attending: Internal Medicine | Admitting: Internal Medicine

## 2016-11-25 DIAGNOSIS — E2839 Other primary ovarian failure: Secondary | ICD-10-CM

## 2016-12-22 ENCOUNTER — Telehealth: Payer: Self-pay | Admitting: Emergency Medicine

## 2016-12-22 NOTE — Telephone Encounter (Signed)
Pt asked that you give he a call back. She has some in depth questions about her bone density test. Thanks.

## 2016-12-28 NOTE — Telephone Encounter (Signed)
Patient aware of results and just wanted to know if she needed to do anything different.

## 2017-03-22 ENCOUNTER — Telehealth: Payer: Self-pay | Admitting: Internal Medicine

## 2017-03-22 NOTE — Telephone Encounter (Signed)
Noted  

## 2017-03-22 NOTE — Telephone Encounter (Signed)
Deborah Barton (NP from [facility]) called to let Dr Sharlet Salina know that the pt had an abnormal Peripheral Vascular Disease Screening. The right was 0.31 and the left was 0.24. Thanks [employer]

## 2017-04-19 ENCOUNTER — Encounter (HOSPITAL_COMMUNITY): Payer: Self-pay | Admitting: Emergency Medicine

## 2017-04-19 ENCOUNTER — Telehealth: Payer: Self-pay | Admitting: Internal Medicine

## 2017-04-19 ENCOUNTER — Emergency Department (HOSPITAL_COMMUNITY)
Admission: EM | Admit: 2017-04-19 | Discharge: 2017-04-19 | Disposition: A | Discharge status: Home / Self Care | Payer: Medicare Other | Attending: Emergency Medicine | Admitting: Emergency Medicine

## 2017-04-19 DIAGNOSIS — R110 Nausea: Secondary | ICD-10-CM

## 2017-04-19 DIAGNOSIS — Z7982 Long term (current) use of aspirin: Secondary | ICD-10-CM | POA: Diagnosis not present

## 2017-04-19 DIAGNOSIS — R1013 Epigastric pain: Secondary | ICD-10-CM | POA: Diagnosis present

## 2017-04-19 DIAGNOSIS — I10 Essential (primary) hypertension: Secondary | ICD-10-CM | POA: Diagnosis not present

## 2017-04-19 DIAGNOSIS — R112 Nausea with vomiting, unspecified: Secondary | ICD-10-CM | POA: Diagnosis not present

## 2017-04-19 LAB — CBC
HCT: 34 % — ABNORMAL LOW (ref 36.0–46.0)
Hemoglobin: 11.1 g/dL — ABNORMAL LOW (ref 12.0–15.0)
MCH: 23.4 pg — ABNORMAL LOW (ref 26.0–34.0)
MCHC: 32.6 g/dL (ref 30.0–36.0)
MCV: 71.7 fL — ABNORMAL LOW (ref 78.0–100.0)
Platelets: 276 10*3/uL (ref 150–400)
RBC: 4.74 MIL/uL (ref 3.87–5.11)
RDW: 15.7 % — ABNORMAL HIGH (ref 11.5–15.5)
WBC: 6.4 10*3/uL (ref 4.0–10.5)

## 2017-04-19 LAB — BASIC METABOLIC PANEL
Anion gap: 5 (ref 5–15)
BUN: 10 mg/dL (ref 6–20)
CO2: 28 mmol/L (ref 22–32)
Calcium: 9.3 mg/dL (ref 8.9–10.3)
Chloride: 105 mmol/L (ref 101–111)
Creatinine, Ser: 0.79 mg/dL (ref 0.44–1.00)
GFR calc Af Amer: >60 mL/min (ref >60)
GFR calc non Af Amer: >60 mL/min (ref >60)
Glucose, Bld: 92 mg/dL (ref 65–99)
Potassium: 3.8 mmol/L (ref 3.5–5.1)
Sodium: 138 mmol/L (ref 135–145)

## 2017-04-19 LAB — I-STAT TROPONIN, ED: Troponin i, poc: 0 ng/mL (ref 0.00–0.08)

## 2017-04-19 LAB — LIPASE, BLOOD: Lipase: 22 U/L (ref 11–51)

## 2017-04-19 MED ORDER — ALUM & MAG HYDROXIDE-SIMETH 200-200-20 MG/5ML PO SUSP
15.0000 mL | Freq: Once | ORAL | Status: DC
  Filled 2017-04-19: qty 30

## 2017-04-19 MED ORDER — ONDANSETRON 4 MG PO TBDP: ORAL_TABLET | ORAL | 0 refills | Status: DC

## 2017-04-19 NOTE — Telephone Encounter (Signed)
[place] Day - Client [place] [facility] Patient Name: Deborah Barton DOB: [birth year] Initial Comment Caller states she has problems with acid reflux. She vomited 4 times yesterday. When she gets up in the morning she feels nausea. She has never has this before it started in the last month or so. Nurse Assessment Nurse: Markus Daft, RN, Sherre Poot Date/Time (Eastern Time): 04/19/2017 1:55:27 PM Confirm and document reason for call. If symptomatic, describe symptoms. ---Caller states she has a tightness in chest. She vomited 4 times yesterday. When she gets up in the morning, she feels nausea. She has never has this before it started in the last month or so off/on. Does the patient have any new or worsening symptoms? ---Yes Will a triage be completed? ---Yes Related visit to physician within the last 2 weeks? ---No Does the PT have any chronic conditions? (i.e. diabetes, asthma, etc.) ---Yes List chronic conditions. ---acid reflux Is this a behavioral health or substance abuse call? ---No Guidelines Guideline Title Affirmed Question Affirmed Notes Chest Pain Pain also present in shoulder(s) or arm(s) or jaw (Exception: pain is clearly made worse by movement) Final Disposition User Go to ED Now Markus Daft, RN, Sherre Poot Comments Rates CP 3-4/10, and radiates to her upper back. She does get a sour taste in her mouth. Referrals [facility] - ED Disagree/Comply: Comply

## 2017-04-19 NOTE — ED Notes (Signed)
Pt ambulatory and independent at discharge.  Verbalized understanding of discharge instructions to [place], [place]

## 2017-04-19 NOTE — ED Triage Notes (Signed)
Per pt, states epigastric pain for months-states she becomes nausea as well-states she vomited 4 times yesterday-denies pain at this time

## 2017-04-19 NOTE — ED Provider Notes (Signed)
[facility] DEPT Provider Note   CSN: [id number] Arrival date & time: 04/19/17  1427     History   Chief Complaint Chief Complaint  Patient presents with  . Abdominal Pain    HPI Deborah Barton is a 71 y.o. female.  71 yo F with a chief complaint of nausea. This been going on for the past week or so. She also has a burning sensation that goes up the back of her throat. She also has a funny taste in the back of her mouth. Has vomited 2 with this. Denies abdominal pain. Denies chest pain. She did feel some slight chest tightness and so she called her family physician who suggested she come to the ED. She has been exercising without worsening of the symptoms. Usually they're worse first thing in the morning. They're also worse when she eats falling back at the end of the night.   The history is provided by the patient.  Abdominal Pain   This is a new problem. The current episode started more than 2 days ago. The problem has not changed since onset.The pain is located in the epigastric region. The pain is at a severity of 2/10. The pain is mild. Associated symptoms include nausea and vomiting. Pertinent negatives include fever, dysuria, headaches, arthralgias and myalgias. Nothing aggravates the symptoms. Nothing relieves the symptoms.    Past Medical History:  Diagnosis Date  . Anxiety   . Cataract   . Hypertension   . Neuropathy     Patient Active Problem List   Diagnosis Date Noted  . Routine general medical examination at a health care facility 11/04/2015  . Toenail fungus 02/27/2015  . Essential hypertension 01/27/2015  . Hereditary and idiopathic peripheral neuropathy 01/27/2015  . Generalized anxiety disorder 01/27/2015  . Migraine headache 12/23/2011  . 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 ABDOMINAL HYSTERECTOMY  1994  . CHOLECYSTECTOMY  1975    OB History    No data available       Home  Medications    Prior to Admission medications   Medication Sig Start Date End Date Taking? Authorizing Provider  Ascorbic Acid (VITAMIN C) 1000 MG tablet Take 1,000 mg by mouth daily.    Historical Provider, MD  Aspirin-Acetaminophen-Caffeine (EXCEDRIN PO) Take 2 tablets by mouth daily as needed (headache).    Historical Provider, MD  DULoxetine (CYMBALTA) 60 MG capsule Take 1 capsule (60 mg total) by mouth daily. 11/08/16   Hoyt Koch, MD  ibuprofen (ADVIL,MOTRIN) 200 MG tablet Take 400 mg by mouth every 6 (six) hours as needed for headache.     Historical Provider, MD  LORazepam (ATIVAN) 1 MG tablet Take 1 tablet (1 mg total) by mouth at bedtime as needed for anxiety. 11/08/16   Hoyt Koch, MD  Omega-3 Fatty Acids (FISH OIL PO) Take 1 capsule by mouth daily.    Historical Provider, MD  ondansetron (ZOFRAN ODT) 4 MG disintegrating tablet 4mg  ODT q4 hours prn nausea/vomit 04/19/17   Deno Etienne, DO    Family History Family History  Problem Relation Age of Onset  . Anemia Other   . Arthritis Other   . Asthma Other   . Depression Other   . Hypertension Other   . Stroke Other   . Colon cancer Neg Hx   . Esophageal cancer Neg Hx   . Stomach cancer Neg Hx   . Rectal cancer Neg Hx  Social History Social History  Substance Use Topics  . Smoking status: Never Smoker  . Smokeless tobacco: Never Used  . Alcohol use 1.8 oz/week    3 Standard drinks or equivalent per week     Comment: occasionally     Allergies   Patient has no known allergies.   Review of Systems Review of Systems  Constitutional: Negative for chills and fever.  HENT: Negative for congestion and rhinorrhea.   Eyes: Negative for redness and visual disturbance.  Respiratory: Negative for shortness of breath and wheezing.   Cardiovascular: Negative for chest pain and palpitations.  Gastrointestinal: Positive for abdominal pain, nausea and vomiting.  Genitourinary: Negative for dysuria and  urgency.  Musculoskeletal: Negative for arthralgias and myalgias.  Skin: Negative for pallor and wound.  Neurological: Negative for dizziness and headaches.     Physical Exam Updated Vital Signs BP (!) 146/85 (BP Location: Left Arm)   Pulse 79   Temp 97.4 F (36.3 C) (Oral)   Resp 18   SpO2 96%   Physical Exam  Constitutional: She is oriented to person, place, and time. She appears well-developed and well-nourished. No distress.  HENT:  Head: Normocephalic and atraumatic.  Eyes: EOM are normal. Pupils are equal, round, and reactive to light.  Neck: Normal range of motion. Neck supple.  Cardiovascular: Normal rate and regular rhythm.  Exam reveals no gallop and no friction rub.   No murmur heard. Pulmonary/Chest: Effort normal. She has no wheezes. She has no rales.  Abdominal: Soft. She exhibits no distension and no mass. There is tenderness (mild). There is no guarding.  Musculoskeletal: She exhibits no edema or tenderness.  Neurological: She is alert and oriented to person, place, and time.  Skin: Skin is warm and dry. She is not diaphoretic.  Psychiatric: She has a normal mood and affect. Her behavior is normal.  Nursing note and vitals reviewed.    ED Treatments / Results  Labs (all labs ordered are listed, but only abnormal results are displayed) Labs Reviewed  CBC - Abnormal; Notable for the following:       Result Value   Hemoglobin 11.1 (*)    HCT 34.0 (*)    MCV 71.7 (*)    MCH 23.4 (*)    RDW 15.7 (*)    All other components within normal limits  BASIC METABOLIC PANEL  LIPASE, BLOOD  I-STAT TROPOININ, ED    EKG  EKG Interpretation  Date/Time:  Tuesday April 19 2017 14:37:05 EDT Ventricular Rate:  74 PR Interval:    QRS Duration: 95 QT Interval:  379 QTC Calculation: 421 R Axis:   71 Text Interpretation:  Sinus rhythm Probable left atrial enlargement No significant change since last tracing Confirmed by Sherrill Buikema MD, Quillian Quince ([id number]) on 04/19/2017 6:52:26  PM       Radiology No results found.  Procedures Procedures (including critical care time)  Medications Ordered in ED Medications  alum & mag hydroxide-simeth (MAALOX/MYLANTA) 200-200-20 MG/5ML suspension 15 mL (not administered)     Initial Impression / Assessment and Plan / ED Course  I have reviewed the triage vital signs and the nursing notes.  Pertinent labs & imaging results that were available during my care of the patient were reviewed by me and considered in my medical decision making (see chart for details).     71 yo F With epigastric discomfort and nausea. Nonexertional. Patient is well-appearing and nontoxic. History is completely atypical from ACS. Will treat for reflux. PCP follow-up.  7:32 PM:  I have discussed the diagnosis/risks/treatment options with the patient and family and believe the pt to be eligible for discharge home to follow-up with PCP. We also discussed returning to the ED immediately if new or worsening sx occur. We discussed the sx which are most concerning (e.g., sudden worsening pain, fever, inability to tolerate by mouth) that necessitate immediate return. Medications administered to the patient during their visit and any new prescriptions provided to the patient are listed below.  Medications given during this visit Medications  alum & mag hydroxide-simeth (MAALOX/MYLANTA) 200-200-20 MG/5ML suspension 15 mL (not administered)     The patient appears reasonably screen and/or stabilized for discharge and I doubt any other medical condition or other [facility] requiring further screening, evaluation, or treatment in the ED at this time prior to discharge.    Final Clinical Impressions(s) / ED Diagnoses   Final diagnoses:  Nausea    New Prescriptions New Prescriptions   ONDANSETRON (ZOFRAN ODT) 4 MG DISINTEGRATING TABLET    4mg  ODT q4 hours prn nausea/vomit     Deno Etienne, DO 04/19/17 1932

## 2017-04-19 NOTE — Discharge Instructions (Signed)
Try zantac 150mg twice a day.  ° ° °

## 2017-04-28 DIAGNOSIS — H40013 Open angle with borderline findings, low risk, bilateral: Secondary | ICD-10-CM | POA: Diagnosis not present

## 2017-04-28 DIAGNOSIS — H2513 Age-related nuclear cataract, bilateral: Secondary | ICD-10-CM | POA: Diagnosis not present

## 2017-04-28 DIAGNOSIS — I7090 Unspecified atherosclerosis: Secondary | ICD-10-CM | POA: Diagnosis not present

## 2017-04-28 DIAGNOSIS — H25013 Cortical age-related cataract, bilateral: Secondary | ICD-10-CM | POA: Diagnosis not present

## 2017-07-08 ENCOUNTER — Other Ambulatory Visit: Payer: Self-pay | Admitting: Internal Medicine

## 2017-07-08 DIAGNOSIS — Z1231 Encounter for screening mammogram for malignant neoplasm of breast: Secondary | ICD-10-CM

## 2017-07-18 ENCOUNTER — Ambulatory Visit: Payer: Medicare Other

## 2017-08-05 ENCOUNTER — Other Ambulatory Visit: Payer: Self-pay | Admitting: Family

## 2017-08-05 ENCOUNTER — Ambulatory Visit (INDEPENDENT_AMBULATORY_CARE_PROVIDER_SITE_OTHER): Payer: Medicare Other | Admitting: Family

## 2017-08-05 ENCOUNTER — Encounter: Payer: Self-pay | Admitting: Family

## 2017-08-05 ENCOUNTER — Other Ambulatory Visit: Payer: Medicare Other

## 2017-08-05 VITALS — BP 138/86 | HR 72 | Temp 98.4°F | Resp 16 | Ht 62.0 in | Wt 136.8 lb

## 2017-08-05 DIAGNOSIS — K5901 Slow transit constipation: Secondary | ICD-10-CM | POA: Diagnosis not present

## 2017-08-05 DIAGNOSIS — Z711 Person with feared health complaint in whom no diagnosis is made: Secondary | ICD-10-CM

## 2017-08-05 DIAGNOSIS — F411 Generalized anxiety disorder: Secondary | ICD-10-CM | POA: Diagnosis not present

## 2017-08-05 LAB — WET PREP, GENITAL
Trich, Wet Prep: NONE SEEN — AB
Yeast Wet Prep HPF POC: NONE SEEN — AB

## 2017-08-05 MED ORDER — METRONIDAZOLE 500 MG PO TABS: 500.0000 mg | ORAL_TABLET | Freq: Two times a day (BID) | ORAL | 0 refills | Status: DC

## 2017-08-05 MED ORDER — LORAZEPAM 1 MG PO TABS: 1.0000 mg | ORAL_TABLET | Freq: Every evening | ORAL | 0 refills | Status: DC | PRN

## 2017-08-05 NOTE — Patient Instructions (Addendum)
Thank you for choosing [facility].  SUMMARY AND INSTRUCTIONS:  Continue to take your medications as prescribed.   Increase fiber intake to 20-25 grams  Continue with docusate sodium; Consider using a stimulant such as Senna or Miralax.   We will check for infections.  Medication:  Your prescription(s) have been submitted to your pharmacy or been printed and provided for you. Please take as directed and contact our office if you believe you are having problem(s) with the medication(s) or have any questions.  Labs:  Please stop by the lab on the lower level of the building for your blood work. Your results will be released to [facility] (or called to you) after review, usually within 72 hours after test completion. If any changes need to be made, you will be notified at that same time.  1.) The lab is open from 7:30am to 5:30 pm Monday-Friday 2.) No appointment is necessary 3.) Fasting (if needed) is 6-8 hours after food and drink; black coffee and water are okay   Follow up:  If your symptoms worsen or fail to improve, please contact our office for further instruction, or in case of emergency go directly to the emergency room at the closest medical facility.     Constipation, Adult Constipation is when a person:  Poops (has a bowel movement) fewer times in a week than normal.  Has a hard time pooping.  Has poop that is dry, hard, or bigger than normal.  Follow these instructions at home: Eating and drinking   Eat foods that have a lot of fiber, such as: ? Fresh fruits and vegetables. ? Whole grains. ? Beans.  Eat less of foods that are high in fat, low in fiber, or overly processed, such as: ? [place] fries. ? Hamburgers. ? Cookies. ? Candy. ? Soda.  Drink enough fluid to keep your pee (urine) clear or pale yellow. General instructions  Exercise regularly or as told by your doctor.  Go to the restroom when you feel like you need to poop. Do not hold it  in.  Take over-the-counter and prescription medicines only as told by your doctor. These include any fiber supplements.  Do pelvic floor retraining exercises, such as: ? Doing deep breathing while relaxing your lower belly (abdomen). ? Relaxing your pelvic floor while pooping.  Watch your condition for any changes.  Keep all follow-up visits as told by your doctor. This is important. Contact a doctor if:  You have pain that gets worse.  You have a fever.  You have not pooped for 4 days.  You throw up (vomit).  You are not hungry.  You lose weight.  You are bleeding from the anus.  You have thin, pencil-like poop (stool). Get help right away if:  You have a fever, and your symptoms suddenly get worse.  You leak poop or have blood in your poop.  Your belly feels hard or bigger than normal (is bloated).  You have very bad belly pain.  You feel dizzy or you faint. This information is not intended to replace advice given to you by your health care provider. Make sure you discuss any questions you have with your health care provider. Document Released: 05/31/2008 Document Revised: 07/02/2016 Document Reviewed: 06/02/2016 Elsevier Interactive Patient Education  2017 [employer].

## 2017-08-05 NOTE — Assessment & Plan Note (Addendum)
This is a new problem. Symptoms are consistent with constipation. Recommend continued use of stool softener and over the counter medications to help with bowel movements. Encouraged increasing fiber to 20-25 grams per day with water intake. If symptoms worsen or do not improve consider prescription medication.

## 2017-08-05 NOTE — Assessment & Plan Note (Signed)
No current vaginal bleeding or urinary symptoms. Recommend follow up with gynecology for further evaluation. Obtain wet prep and Gc/Chlamydia probe to rule out infection. Follow up if symptoms worsen prior to lab work results.

## 2017-08-05 NOTE — Progress Notes (Signed)
Subjective:    Patient ID: Velva Harman, female    DOB: July 09, 1946, 71 y.o.   MRN: [id number]  Chief Complaint  Patient presents with  . Nausea    a couple of weeks ago woke up with a watery mouth and threw up 4 times that day, wakes up sometimes with nausea, had a day of bleeding vaginally on friday but not blood since then    HPI:  JONETTA DAGLEY is a 71 y.o. female who  has a past medical history of Anxiety; Cataract; Hypertension; and Neuropathy. and presents today for  This is a new problem. Associated symptom of nausea has been going on for about 2 weeks and had several episodes of vomiting in the same day. Describes a nauseated stomach off and on. Modifying factors include Pepto Bismol and Zantac which seemed to help. She did not 1 day of spotting, which cleared in a day. Most of the symptoms have come and gone. Describes one episodes of dark stool. Frequency of bowel movements is decreased which is modified with stool softeners. Not currently experiencing symptoms. Stools are described as soft when using the stool softeners. Does have occasional issues with reflux which is adequately controlled with the Zantac.     No Known Allergies    Outpatient Medications Prior to Visit  Medication Sig Dispense Refill  . Ascorbic Acid (VITAMIN C) 1000 MG tablet Take 1,000 mg by mouth daily.    . Aspirin-Acetaminophen-Caffeine (EXCEDRIN PO) Take 2 tablets by mouth daily as needed (headache).    . DULoxetine (CYMBALTA) 60 MG capsule Take 1 capsule (60 mg total) by mouth daily. 90 capsule 3  . ibuprofen (ADVIL,MOTRIN) 200 MG tablet Take 400 mg by mouth every 6 (six) hours as needed for headache.     . Omega-3 Fatty Acids (FISH OIL PO) Take 1 capsule by mouth daily.    . ondansetron (ZOFRAN ODT) 4 MG disintegrating tablet 4mg  ODT q4 hours prn nausea/vomit 20 tablet 0  . LORazepam (ATIVAN) 1 MG tablet Take 1 tablet (1 mg total) by mouth at bedtime as needed for anxiety. 30 tablet 1    No facility-administered medications prior to visit.       Past Surgical History:  Procedure Laterality Date  . ABDOMINAL HYSTERECTOMY  1994  . CHOLECYSTECTOMY  1975      Past Medical History:  Diagnosis Date  . Anxiety   . Cataract   . Hypertension   . Neuropathy       Review of Systems  Constitutional: Negative for activity change, appetite change, chills, fatigue, fever and unexpected weight change.  Cardiovascular: Negative for chest pain, palpitations and leg swelling.  Gastrointestinal: Positive for abdominal distention, constipation, nausea and vomiting.  Genitourinary: Positive for vaginal bleeding. Negative for dysuria, flank pain, hematuria, urgency and vaginal discharge.      Objective:    BP 138/86 (BP Location: Left Arm, Patient Position: Sitting, Cuff Size: Normal)   Pulse 72   Temp 98.4 F (36.9 C) (Oral)   Resp 16   Ht 5\' 2"  (1.575 m)   Wt 136 lb 12.8 oz (62.1 kg)   SpO2 97%   BMI 25.02 kg/m  Nursing note and vital signs reviewed.  Physical Exam  Constitutional: She is oriented to person, place, and time. She appears well-developed and well-nourished. No distress.  Cardiovascular: Normal rate, regular rhythm, normal heart sounds and intact distal pulses.   Pulmonary/Chest: Effort normal and breath sounds normal.  Abdominal: Normal appearance and  bowel sounds are normal. She exhibits distension. She exhibits no mass. There is no hepatosplenomegaly. There is tenderness in the left lower quadrant. There is no rigidity, no rebound, no guarding, no tenderness at McBurney's point and negative Murphy's sign.  Neurological: She is alert and oriented to person, place, and time.  Skin: Skin is warm and dry.  Psychiatric: She has a normal mood and affect. Her behavior is normal. Judgment and thought content normal.       Assessment & Plan:   Problem List Items Addressed This Visit      Digestive   Slow transit constipation - Primary    This is a  new problem. Symptoms are consistent with constipation. Recommend continued use of stool softener and over the counter medications to help with bowel movements. Encouraged increasing fiber to 20-25 grams per day with water intake. If symptoms worsen or do not improve consider prescription medication.         Other   Generalized anxiety disorder    Stable and refill lorazepam.       Concern about STD in female without diagnosis    No current vaginal bleeding or urinary symptoms. Recommend follow up with gynecology for further evaluation. Obtain wet prep and Gc/Chlamydia probe to rule out infection. Follow up if symptoms worsen prior to lab work results.       Relevant Orders   Wet prep, genital (Completed)   GC/Chlamydia Probe Amp       I am having Ms. Puff maintain her ibuprofen, Aspirin-Acetaminophen-Caffeine (EXCEDRIN PO), vitamin C, Omega-3 Fatty Acids (FISH OIL PO), DULoxetine, ondansetron, and LORazepam.   Meds ordered this encounter  Medications  . LORazepam (ATIVAN) 1 MG tablet    Sig: Take 1 tablet (1 mg total) by mouth at bedtime as needed for anxiety.    Dispense:  30 tablet    Refill:  0    Order Specific Question:   Supervising Provider    Answer:   Pricilla Holm A [[id number]]     Follow-up: Return if symptoms worsen or fail to improve.  Mauricio Po, FNP

## 2017-08-05 NOTE — Assessment & Plan Note (Signed)
Stable and refill lorazepam.

## 2017-08-06 LAB — GC/CHLAMYDIA PROBE AMP
CT Probe RNA: NOT DETECTED
GC Probe RNA: NOT DETECTED

## 2017-08-12 ENCOUNTER — Telehealth: Payer: Self-pay | Admitting: Internal Medicine

## 2017-08-12 NOTE — Telephone Encounter (Signed)
Pt called in and said that she has lost her script for the LORazepam (ATIVAN) 1 MG tablet [[id number]]  She wants to know [facility]?

## 2017-08-12 NOTE — Telephone Encounter (Signed)
Ok to call in. Please inform patient that we usually do not resend in prescriptions for controlled substances.

## 2017-08-12 NOTE — Telephone Encounter (Signed)
Notified pt w/Greg response rx [facility] spoke w/pharmacist allison...Johny Chess

## 2017-08-12 NOTE — Telephone Encounter (Signed)
Check [place] registry no hisoty of last refilled date. Pt was here on 08/05/17 was given rx then. Is it ok to call [facility].../lmb

## 2017-08-15 ENCOUNTER — Ambulatory Visit: Payer: Medicare Other

## 2017-08-22 ENCOUNTER — Ambulatory Visit
Admission: RE | Admit: 2017-08-22 | Discharge: 2017-08-22 | Disposition: A | Discharge status: Home / Self Care | Payer: Medicare Other | Source: Ambulatory Visit | Attending: Internal Medicine | Admitting: Internal Medicine

## 2017-08-22 DIAGNOSIS — Z1231 Encounter for screening mammogram for malignant neoplasm of breast: Secondary | ICD-10-CM | POA: Diagnosis not present

## 2017-09-19 ENCOUNTER — Encounter: Payer: Self-pay | Admitting: Obstetrics & Gynecology

## 2017-09-19 ENCOUNTER — Ambulatory Visit (INDEPENDENT_AMBULATORY_CARE_PROVIDER_SITE_OTHER): Payer: Medicare Other | Admitting: Obstetrics & Gynecology

## 2017-09-19 ENCOUNTER — Telehealth: Payer: Self-pay | Admitting: Internal Medicine

## 2017-09-19 VITALS — BP 136/80 | Ht 62.0 in | Wt 136.0 lb

## 2017-09-19 DIAGNOSIS — Z90710 Acquired absence of both cervix and uterus: Secondary | ICD-10-CM

## 2017-09-19 DIAGNOSIS — R195 Other fecal abnormalities: Secondary | ICD-10-CM

## 2017-09-19 DIAGNOSIS — N930 Postcoital and contact bleeding: Secondary | ICD-10-CM

## 2017-09-19 LAB — WET PREP FOR TRICH, YEAST, CLUE

## 2017-09-19 MED ORDER — DULOXETINE HCL 60 MG PO CPEP: 60.0000 mg | ORAL_CAPSULE | Freq: Every day | ORAL | 0 refills | Status: DC

## 2017-09-19 NOTE — Telephone Encounter (Signed)
DULoxetine (CYMBALTA) 60 MG capsule   Patient is requesting a refill on this medication.   [id number] [address], [place]. [phone] (Phone) [phone] (Fax)

## 2017-09-19 NOTE — Progress Notes (Signed)
    Deborah Barton 1946-03-21 [id number]        71 y.o.  G2P2 Engaged x 7 years.  2 sons are in 30-40's.  RP:  Postcoital bleeding x 2 months  HPI:  S/P Total Hysterectomy for Fibroids in her 32's.  Mild spotting after IC x 3 occasions in the past 2 months.  Using condoms with fiance.  Replens for IC.  No HRT.  No pelvic pain.  No abnormal d/c currently, but Bacterial Vaginosis a few times in the past.  Last Pap normal 6 years ago.  BMs regular, but much darker recently.  Colono normal 2016.  Will f/u with GI for dark stools.  Mictions normal.   Past medical history,surgical history, problem list, medications, allergies, family history and social history were all reviewed and documented in the EPIC chart.  Directed ROS with pertinent positives and negatives documented in the history of present illness/assessment and plan.  Exam:  Vitals:   09/19/17 0956  BP: 136/80  Weight: 136 lb (61.7 kg)  Height: 5\' 2"  (1.575 m)   General appearance:  Normal  Abdo:  Soft, NT, not distended  Gyn exam:  Vulva normal.  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  Pap/HPV done.  Secretions wnl, but Wet prep done.                     Bimanual exam:  Non-tender.  No pelvic mass felt.  Assessment/Plan:  71 y.o. G2P2   1. Postcoital bleeding Wet prep normal.  Pap/HPV HR pending.  F/U Excisional Biopsy of erythematous old Granuloma at f/u.  Will do Colposcopy if Pap abnormal. - WET PREP FOR TRICH, YEAST, CLUE - Pap IG and HPV (high risk) DNA detection  2. H/O total hysterectomy  3. Dark stools Referred back to GI.  Counseling on above issues >50% x 30 minutes.  Princess Bruins MD, 10:31 AM 09/19/2017

## 2017-09-19 NOTE — Telephone Encounter (Signed)
Pt is due for annual appt in Nov sent refill until appt...Deborah Barton

## 2017-09-20 LAB — PAP IG AND HPV HIGH-RISK: HPV DNA High Risk: NOT DETECTED

## 2017-09-23 NOTE — Patient Instructions (Signed)
1. Postcoital bleeding Wet prep normal.  Pap/HPV HR pending.  F/U Excisional Biopsy of erythematous old Granuloma at f/u.  Will do Colposcopy if Pap abnormal. - WET PREP FOR TRICH, YEAST, CLUE - Pap IG and HPV (high risk) DNA detection  2. H/O total hysterectomy  3. Dark stools Referred back to GI.  Deborah Barton, it was a pleasure to meet you today!  I will inform you of your results and see you shortly for removal of the vaginal lesion.

## 2017-10-07 ENCOUNTER — Encounter: Payer: Self-pay | Admitting: Obstetrics & Gynecology

## 2017-10-07 ENCOUNTER — Ambulatory Visit (INDEPENDENT_AMBULATORY_CARE_PROVIDER_SITE_OTHER): Payer: Medicare Other | Admitting: Obstetrics & Gynecology

## 2017-10-07 VITALS — BP 142/80 | Ht 62.0 in | Wt 136.0 lb

## 2017-10-07 DIAGNOSIS — N898 Other specified noninflammatory disorders of vagina: Secondary | ICD-10-CM

## 2017-10-07 DIAGNOSIS — R87610 Atypical squamous cells of undetermined significance on cytologic smear of cervix (ASC-US): Secondary | ICD-10-CM

## 2017-10-07 DIAGNOSIS — N952 Postmenopausal atrophic vaginitis: Secondary | ICD-10-CM | POA: Diagnosis not present

## 2017-10-07 NOTE — Progress Notes (Signed)
    Deborah Barton 08/25/1946 [id number]        71 y.o.  G2P2 Engaged x 7 years.  2 sons are in 30-40's.  RP:  Postcoital bleeding x 2 months with a granuloma at the vaginal vault/Pap ASCUS, HPV HR negative  HPI:  No change since last visit:  S/P Total Hysterectomy for Fibroids in her 30's.  Mild spotting after IC x 3 occasions in the past 2 months.  Using condoms with fiance.  Replens for IC. No HRT.  No pelvic pain.  No abnormal d/c currently, but Bacterial Vaginosis a few times in the past.  Last Pap normal 6 years ago.  BMs regular, but much darker recently.  Colono normal 2016.  Will f/u with GI for dark stools.  Mictions normal.   Past medical history,surgical history, problem list, medications, allergies, family history and social history were all reviewed and documented in the EPIC chart.  Directed ROS with pertinent positives and negatives documented in the history of present illness/assessment and plan.  Exam:  Vitals:   10/07/17 1551  BP: (!) 142/80  Weight: 136 lb (61.7 kg)  Height: 5\' 2"  (1.575 m)   General appearance:  Normal  Gyn exam:  Vulva normal.  Speculum:  Old granuloma about 1 cm in diameter at vaginal vault.  Excision with Colpo Biopsy instrument in 3 bites.  Good hemostasis with Silver Nitrate.  No other lesion.  Sent to pathology.  Pap ASCUS/HPV HR neg on 09/19/2017   Assessment/Plan:  71 y.o. G2P2   1. Vaginal lesion Probably causing Postcoital bleeding.  Excision done today, specimen sent to pathology.  No complication.  Recommendation discussed. - Surgical pathology  2. ASCUS of cervix with negative high risk HPV Reassured given no HR HPV and surgically absent cervix.  Will repeat Pap in 1 year.  Counseling on above issues >50% x 15 minutes.  Princess Bruins MD, 12:29 PM 10/08/2017

## 2017-10-08 ENCOUNTER — Emergency Department (HOSPITAL_COMMUNITY)
Admission: EM | Admit: 2017-10-08 | Discharge: 2017-10-08 | Discharge status: Against Medical Advice | Payer: Medicare Other | Attending: Emergency Medicine | Admitting: Emergency Medicine

## 2017-10-08 ENCOUNTER — Encounter (HOSPITAL_COMMUNITY): Payer: Self-pay | Admitting: Emergency Medicine

## 2017-10-08 DIAGNOSIS — Z5321 Procedure and treatment not carried out due to patient leaving prior to being seen by health care provider: Secondary | ICD-10-CM | POA: Diagnosis not present

## 2017-10-08 DIAGNOSIS — M79661 Pain in right lower leg: Secondary | ICD-10-CM | POA: Insufficient documentation

## 2017-10-08 NOTE — ED Notes (Signed)
Pt reported to registration that she was leaving.  ?

## 2017-10-08 NOTE — Patient Instructions (Signed)
1. Vaginal lesion Probably causing Postcoital bleeding.  Excision done today, specimen sent to pathology.  No complication.  Recommendation discussed. - Surgical pathology  2. ASCUS of cervix with negative high risk HPV Reassured given no HR HPV and surgically absent cervix.  Will repeat Pap in 1 year.  Bailei, it was good to see you again today!  If everything goes well for you, I will see you again for your Annual Gyn visit in a year.

## 2017-10-08 NOTE — ED Triage Notes (Signed)
Patient reports falling about week ago at gym and having right leg pain. Today patient reports today has pain on right foot that radiates to top of foot. Patient has dark brown streaking from heel to top of foot.  Patient also still c/o right leg pain.

## 2017-10-10 ENCOUNTER — Ambulatory Visit (INDEPENDENT_AMBULATORY_CARE_PROVIDER_SITE_OTHER): Payer: Medicare Other | Admitting: Family Medicine

## 2017-10-10 ENCOUNTER — Encounter: Payer: Self-pay | Admitting: Family Medicine

## 2017-10-10 VITALS — BP 136/74 | HR 74 | Temp 98.2°F | Ht 62.0 in | Wt 136.0 lb

## 2017-10-10 DIAGNOSIS — M25561 Pain in right knee: Secondary | ICD-10-CM

## 2017-10-10 DIAGNOSIS — L819 Disorder of pigmentation, unspecified: Secondary | ICD-10-CM | POA: Diagnosis not present

## 2017-10-10 MED ORDER — NAPROXEN 500 MG PO TABS: 500.0000 mg | ORAL_TABLET | Freq: Two times a day (BID) | ORAL | 0 refills | Status: AC

## 2017-10-10 NOTE — Patient Instructions (Addendum)
Thank you for coming in,   Please ice the knee.   Please use compression on the knee.   Please try the exercises or ride a bike for therapy.   Please follow up with me if your symptoms do not improve.     Please feel free to call with any questions or concerns at any time, at [phone]. --Dr. Raeford Razor

## 2017-10-10 NOTE — Progress Notes (Signed)
Deborah Barton - 71 y.o. female MRN [id number]  Date of birth: 12-21-1946  SUBJECTIVE:  Including CC & ROS.  Chief Complaint  Patient presents with  . Fall    She slid down off of treadmill on 09/29/17-she landed on her right leg and right foot. She also noticed a dark spot on her right foot.    Deborah Barton is a 71 year old female presenting with right knee pain and discoloration on her right foot. She was running on the treadmill 2 weeks ago and had a fall off of it. She landed on her right sided knee. Since that time she's had medial sided knee pain that is worse with certain movements. She has had some swelling in this knee. She is not taking any medications for this. The pain is sharp and stabbing in nature. The pain is localized to her knee. She denies any history of surgery or previous injury to her knee. On Saturday she noticed a dark line appearing on her foot. It ranges from her dorsal mid foot and extends laterally to the calcaneus. She called EMS about this and went to the emergency department. She left the emergency department because she was unable to be seen. She tried several different solvents in order to remove this dark line but it has persisted.     Review of Systems  Constitutional: Negative for fever.  Musculoskeletal: Positive for arthralgias and joint swelling. Negative for gait problem.  Skin: Positive for color change.  Neurological: Negative for weakness and numbness.  Hematological: Negative for adenopathy.    HISTORY: Past Medical, Surgical, Social, and Family History Reviewed & Updated per EMR.   Pertinent Historical Findings include:  Past Medical History:  Diagnosis Date  . Acid reflux   . Anxiety   . Cataract   . Hypertension   . Neuropathy     Past Surgical History:  Procedure Laterality Date  . ABDOMINAL HYSTERECTOMY  1994  . CHOLECYSTECTOMY  1975    No Known Allergies  Family History  Problem Relation Age of Onset  . Anemia Other   .  Arthritis Other   . Asthma Other   . Depression Other   . Hypertension Other   . Stroke Other   . Cancer Sister        uterine  . Cancer Maternal Aunt        ovarian  . Cancer Maternal Grandmother        ovarian   . Breast cancer Paternal Grandmother   . Colon cancer Neg Hx   . Esophageal cancer Neg Hx   . Stomach cancer Neg Hx   . Rectal cancer Neg Hx      Social History   Social History  . Marital status: Divorced    Spouse name: N/A  . Number of children: N/A  . Years of education: N/A   Occupational History  . Not on file.   Social History Main Topics  . Smoking status: Never Smoker  . Smokeless tobacco: Never Used  . Alcohol use 1.8 oz/week    3 Standard drinks or equivalent per week     Comment: occasionally  . Drug use: No  . Sexual activity: Yes    Partners: Male     Comment: 1st intercourse- 58, partners- 55, current partner- 20 yrs    Other Topics Concern  . Not on file   Social History Narrative  . No narrative on file     PHYSICAL EXAM:  VS: BP 136/74 (  BP Location: Right Arm, Patient Position: Sitting, Cuff Size: Normal)   Pulse 74   Temp 98.2 F (36.8 C) (Oral)   Ht 5\' 2"  (1.575 m)   Wt 136 lb (61.7 kg)   SpO2 98%   BMI 24.87 kg/m  Physical Exam Gen: NAD, alert, cooperative with exam, well-appearing ENT: normal lips, normal nasal mucosa,  Eye: normal EOM, normal conjunctiva and lids CV:  no edema, +2 pedal pulses   Resp: no accessory muscle use, non-labored,  Skin: no rashes, no areas of induration  Neuro: normal tone, normal sensation to touch Psych:  normal insight, alert and oriented MSK:  Right knee: Tenderness to palpation of the medial joint line. Crepitus palpated No pain with patellar grind compression. Normal knee flexion and extension. Normal strength to resistance. No obvious effusion. Normal and pleasant valgus and varus stress testing. Negative McMurray's test. Neurovascularly intact. Right foot. Hyperpigmented  linear line extending from the dorsal midfoot laterally to the calcaneus.      Limited ultrasound: Right knee:  Mild to moderate effusion within the suprapatellar pouch. Lateral meniscus has some outpouching. Medial joint line appears to be narrowed with some chronic degenerative meniscal changes  Summary: Effusion and arthritic changes in the medial joint line  Ultrasound and interpretation by Clearance Coots, MD             ASSESSMENT & PLAN:   Discoloration of skin This is possible to be a bruise. Cannot remove with any techniques - Watch and wait for now. - May have to refer to dermatology if persists  Acute pain of right knee It appears that she has an acute flare of some underlying arthritis. Has a decent effusion within the right knee. No mechanical symptoms. - Naproxen - Eyes, compression, and elevation - If no improvement would aspirate and inject. - Can consider imaging and referral to physical therapy if no improvement - Counseled on home exercises

## 2017-10-11 DIAGNOSIS — L819 Disorder of pigmentation, unspecified: Secondary | ICD-10-CM | POA: Insufficient documentation

## 2017-10-11 DIAGNOSIS — M25561 Pain in right knee: Secondary | ICD-10-CM | POA: Insufficient documentation

## 2017-10-11 DIAGNOSIS — M25562 Pain in left knee: Secondary | ICD-10-CM | POA: Insufficient documentation

## 2017-10-11 NOTE — Assessment & Plan Note (Signed)
This is possible to be a bruise. Cannot remove with any techniques - Watch and wait for now. - May have to refer to dermatology if persists

## 2017-10-11 NOTE — Assessment & Plan Note (Signed)
It appears that she has an acute flare of some underlying arthritis. Has a decent effusion within the right knee. No mechanical symptoms. - Naproxen - Eyes, compression, and elevation - If no improvement would aspirate and inject. - Can consider imaging and referral to physical therapy if no improvement - Counseled on home exercises

## 2017-11-23 ENCOUNTER — Ambulatory Visit (INDEPENDENT_AMBULATORY_CARE_PROVIDER_SITE_OTHER): Payer: Medicare Other | Admitting: Internal Medicine

## 2017-11-23 ENCOUNTER — Other Ambulatory Visit (INDEPENDENT_AMBULATORY_CARE_PROVIDER_SITE_OTHER): Payer: Medicare Other

## 2017-11-23 ENCOUNTER — Encounter: Payer: Self-pay | Admitting: Internal Medicine

## 2017-11-23 VITALS — BP 130/82 | HR 80 | Temp 98.0°F | Ht 62.0 in | Wt 135.0 lb

## 2017-11-23 DIAGNOSIS — Z0000 Encounter for general adult medical examination without abnormal findings: Secondary | ICD-10-CM

## 2017-11-23 DIAGNOSIS — I10 Essential (primary) hypertension: Secondary | ICD-10-CM

## 2017-11-23 DIAGNOSIS — Z23 Encounter for immunization: Secondary | ICD-10-CM

## 2017-11-23 DIAGNOSIS — G609 Hereditary and idiopathic neuropathy, unspecified: Secondary | ICD-10-CM | POA: Diagnosis not present

## 2017-11-23 LAB — CBC
HCT: 34.4 % — ABNORMAL LOW (ref 36.0–46.0)
Hemoglobin: 11 g/dL — ABNORMAL LOW (ref 12.0–15.0)
MCHC: 32.1 g/dL (ref 30.0–36.0)
MCV: 73.7 fl — ABNORMAL LOW (ref 78.0–100.0)
Platelets: 285 10*3/uL (ref 150.0–400.0)
RBC: 4.67 Mil/uL (ref 3.87–5.11)
RDW: 15.9 % — ABNORMAL HIGH (ref 11.5–15.5)
WBC: 6.7 10*3/uL (ref 4.0–10.5)

## 2017-11-23 LAB — COMPREHENSIVE METABOLIC PANEL
ALT: 13 U/L (ref 0–35)
AST: 19 U/L (ref 0–37)
Albumin: 4.2 g/dL (ref 3.5–5.2)
Alkaline Phosphatase: 88 U/L (ref 39–117)
BUN: 12 mg/dL (ref 6–23)
CO2: 29 mEq/L (ref 19–32)
Calcium: 9.5 mg/dL (ref 8.4–10.5)
Chloride: 103 mEq/L (ref 96–112)
Creatinine, Ser: 0.79 mg/dL (ref 0.40–1.20)
GFR: 92.54 mL/min (ref >60.00)
Glucose, Bld: 105 mg/dL — ABNORMAL HIGH (ref 70–99)
Potassium: 3.8 mEq/L (ref 3.5–5.1)
Sodium: 138 mEq/L (ref 135–145)
Total Bilirubin: 0.6 mg/dL (ref 0.2–1.2)
Total Protein: 7.2 g/dL (ref 6.0–8.3)

## 2017-11-23 LAB — LIPID PANEL
Cholesterol: 205 mg/dL — ABNORMAL HIGH (ref 0–200)
HDL: 60.9 mg/dL (ref >39.00)
LDL Cholesterol: 131 mg/dL — ABNORMAL HIGH (ref 0–99)
NonHDL: 144.53
Total CHOL/HDL Ratio: 3
Triglycerides: 66 mg/dL (ref 0.0–149.0)
VLDL: 13.2 mg/dL (ref 0.0–40.0)

## 2017-11-23 MED ORDER — METRONIDAZOLE 0.75 % VA GEL: 1.0000 | Freq: Two times a day (BID) | VAGINAL | 0 refills | Status: DC

## 2017-11-23 MED ORDER — ZOSTER VAC RECOMB ADJUVANTED 50 MCG/0.5ML IM SUSR
0.5000 mL | Freq: Once | INTRAMUSCULAR | 1 refills | Status: AC
Start: 2017-11-23 — End: 2017-11-23

## 2017-11-23 MED ORDER — PREDNISONE 20 MG PO TABS: 40.0000 mg | ORAL_TABLET | Freq: Every day | ORAL | 0 refills | Status: DC

## 2017-11-23 NOTE — Assessment & Plan Note (Signed)
EKG done without changes. Checking CMP and lipid panel and CBC. BP at goal off meds.

## 2017-11-23 NOTE — Patient Instructions (Signed)
We have checked the EKG today which is normal and not changed from last time.  We have given you the shingles prescription to take to the pharmacy.  We have sent in prednisone for the knee. Take 2 pills daily for 5 days.  Health Maintenance, Female Adopting a healthy lifestyle and getting preventive care can go a long way to promote health and wellness. Talk with your health care provider about what schedule of regular examinations is right for you. This is a good chance for you to check in with your provider about disease prevention and staying healthy. In between checkups, there are plenty of things you can do on your own. Experts have done a lot of research about which lifestyle changes and preventive measures are most likely to keep you healthy. Ask your health care provider for more information. Weight and diet Eat a healthy diet  Be sure to include plenty of vegetables, fruits, low-fat dairy products, and lean protein.  Do not eat a lot of foods high in solid fats, added sugars, or salt.  Get regular exercise. This is one of the most important things you can do for your health. ? Most adults should exercise for at least 150 minutes each week. The exercise should increase your heart rate and make you sweat (moderate-intensity exercise). ? Most adults should also do strengthening exercises at least twice a week. This is in addition to the moderate-intensity exercise.  Maintain a healthy weight  Body mass index (BMI) is a measurement that can be used to identify possible weight problems. It estimates body fat based on height and weight. Your health care provider can help determine your BMI and help you achieve or maintain a healthy weight.  For females 75 years of age and older: ? A BMI below 18.5 is considered underweight. ? A BMI of 18.5 to 24.9 is normal. ? A BMI of 25 to 29.9 is considered overweight. ? A BMI of 30 and above is considered obese.  Watch levels of cholesterol and  blood lipids  You should start having your blood tested for lipids and cholesterol at 70 years of age, then have this test every 5 years.  You may need to have your cholesterol levels checked more often if: ? Your lipid or cholesterol levels are high. ? You are older than 70 years of age. ? You are at high risk for heart disease.  Cancer screening Lung Cancer  Lung cancer screening is recommended for adults 55-58 years old who are at high risk for lung cancer because of a history of smoking.  A yearly low-dose CT scan of the lungs is recommended for people who: ? Currently smoke. ? Have quit within the past 15 years. ? Have at least a 30-pack-year history of smoking. A pack year is smoking an average of one pack of cigarettes a day for 1 year.  Yearly screening should continue until it has been 15 years since you quit.  Yearly screening should stop if you develop a health problem that would prevent you from having lung cancer treatment.  Breast Cancer  Practice breast self-awareness. This means understanding how your breasts normally appear and feel.  It also means doing regular breast self-exams. Let your health care provider know about any changes, no matter how small.  If you are in your 20s or 30s, you should have a clinical breast exam (CBE) by a health care provider every 1-3 years as part of a regular health exam.  If  you are 40 or older, have a CBE every year. Also consider having a breast X-ray (mammogram) every year.  If you have a family history of breast cancer, talk to your health care provider about genetic screening.  If you are at high risk for breast cancer, talk to your health care provider about having an MRI and a mammogram every year.  Breast cancer gene (BRCA) assessment is recommended for women who have family members with BRCA-related cancers. BRCA-related cancers include: ? Breast. ? Ovarian. ? Tubal. ? Peritoneal cancers.  Results of the assessment  will determine the need for genetic counseling and BRCA1 and BRCA2 testing.  Cervical Cancer Your health care provider may recommend that you be screened regularly for cancer of the pelvic organs (ovaries, uterus, and vagina). This screening involves a pelvic examination, including checking for microscopic changes to the surface of your cervix (Pap test). You may be encouraged to have this screening done every 3 years, beginning at age 75.  For women ages 60-65, health care providers may recommend pelvic exams and Pap testing every 3 years, or they may recommend the Pap and pelvic exam, combined with testing for human papilloma virus (HPV), every 5 years. Some types of HPV increase your risk of cervical cancer. Testing for HPV may also be done on women of any age with unclear Pap test results.  Other health care providers may not recommend any screening for nonpregnant women who are considered low risk for pelvic cancer and who do not have symptoms. Ask your health care provider if a screening pelvic exam is right for you.  If you have had past treatment for cervical cancer or a condition that could lead to cancer, you need Pap tests and screening for cancer for at least 20 years after your treatment. If Pap tests have been discontinued, your risk factors (such as having a new sexual partner) need to be reassessed to determine if screening should resume. Some women have medical problems that increase the chance of getting cervical cancer. In these cases, your health care provider may recommend more frequent screening and Pap tests.  Colorectal Cancer  This type of cancer can be detected and often prevented.  Routine colorectal cancer screening usually begins at 70 years of age and continues through 70 years of age.  Your health care provider may recommend screening at an earlier age if you have risk factors for colon cancer.  Your health care provider may also recommend using home test kits to  check for hidden blood in the stool.  A small camera at the end of a tube can be used to examine your colon directly (sigmoidoscopy or colonoscopy). This is done to check for the earliest forms of colorectal cancer.  Routine screening usually begins at age 69.  Direct examination of the colon should be repeated every 5-10 years through 70 years of age. However, you may need to be screened more often if early forms of precancerous polyps or small growths are found.  Skin Cancer  Check your skin from head to toe regularly.  Tell your health care provider about any new moles or changes in moles, especially if there is a change in a mole's shape or color.  Also tell your health care provider if you have a mole that is larger than the size of a pencil eraser.  Always use sunscreen. Apply sunscreen liberally and repeatedly throughout the day.  Protect yourself by wearing long sleeves, pants, a wide-brimmed hat, and sunglasses  whenever you are outside.  Heart disease, diabetes, and high blood pressure  High blood pressure causes heart disease and increases the risk of stroke. High blood pressure is more likely to develop in: ? People who have blood pressure in the high end of the normal range (130-139/85-89 mm Hg). ? People who are overweight or obese. ? People who are African American.  If you are 70-42 years of age, have your blood pressure checked every 3-5 years. If you are 52 years of age or older, have your blood pressure checked every year. You should have your blood pressure measured twice-once when you are at a hospital or clinic, and once when you are not at a hospital or clinic. Record the average of the two measurements. To check your blood pressure when you are not at a hospital or clinic, you can use: ? An automated blood pressure machine at a pharmacy. ? A home blood pressure monitor.  If you are between 28 years and 9 years old, ask your health care provider if you should  take aspirin to prevent strokes.  Have regular diabetes screenings. This involves taking a blood sample to check your fasting blood sugar level. ? If you are at a normal weight and have a low risk for diabetes, have this test once every three years after 70 years of age. ? If you are overweight and have a high risk for diabetes, consider being tested at a younger age or more often. Preventing infection Hepatitis B  If you have a higher risk for hepatitis B, you should be screened for this virus. You are considered at high risk for hepatitis B if: ? You were born in a country where hepatitis B is common. Ask your health care provider which countries are considered high risk. ? Your parents were born in a high-risk country, and you have not been immunized against hepatitis B (hepatitis B vaccine). ? You have HIV or AIDS. ? You use needles to inject street drugs. ? You live with someone who has hepatitis B. ? You have had sex with someone who has hepatitis B. ? You get hemodialysis treatment. ? You take certain medicines for conditions, including cancer, organ transplantation, and autoimmune conditions.  Hepatitis C  Blood testing is recommended for: ? Everyone born from [birth year] through 1964. ? Anyone with known risk factors for hepatitis C.  Sexually transmitted infections (STIs)  You should be screened for sexually transmitted infections (STIs) including gonorrhea and chlamydia if: ? You are sexually active and are younger than 70 years of age. ? You are older than 70 years of age and your health care provider tells you that you are at risk for this type of infection. ? Your sexual activity has changed since you were last screened and you are at an increased risk for chlamydia or gonorrhea. Ask your health care provider if you are at risk.  If you do not have HIV, but are at risk, it may be recommended that you take a prescription medicine daily to prevent HIV infection. This is called  pre-exposure prophylaxis (PrEP). You are considered at risk if: ? You are sexually active and do not regularly use condoms or know the HIV status of your partner(s). ? You take drugs by injection. ? You are sexually active with a partner who has HIV.  Talk with your health care provider about whether you are at high risk of being infected with HIV. If you choose to begin PrEP, you should  first be tested for HIV. You should then be tested every 3 months for as long as you are taking PrEP. Pregnancy  If you are premenopausal and you may become pregnant, ask your health care provider about preconception counseling.  If you may become pregnant, take 400 to 800 micrograms (mcg) of folic acid every day.  If you want to prevent pregnancy, talk to your health care provider about birth control (contraception). Osteoporosis and menopause  Osteoporosis is a disease in which the bones lose minerals and strength with aging. This can result in serious bone fractures. Your risk for osteoporosis can be identified using a bone density scan.  If you are 64 years of age or older, or if you are at risk for osteoporosis and fractures, ask your health care provider if you should be screened.  Ask your health care provider whether you should take a calcium or vitamin D supplement to lower your risk for osteoporosis.  Menopause may have certain physical symptoms and risks.  Hormone replacement therapy may reduce some of these symptoms and risks. Talk to your health care provider about whether hormone replacement therapy is right for you. Follow these instructions at home:  Schedule regular health, dental, and eye exams.  Stay current with your immunizations.  Do not use any tobacco products including cigarettes, chewing tobacco, or electronic cigarettes.  If you are pregnant, do not drink alcohol.  If you are breastfeeding, limit how much and how often you drink alcohol.  Limit alcohol intake to no more  than 1 drink per day for nonpregnant women. One drink equals 12 ounces of beer, 5 ounces of wine, or 1 ounces of hard liquor.  Do not use street drugs.  Do not share needles.  Ask your health care provider for help if you need support or information about quitting drugs.  Tell your health care provider if you often feel depressed.  Tell your health care provider if you have ever been abused or do not feel safe at home. This information is not intended to replace advice given to you by your health care provider. Make sure you discuss any questions you have with your health care provider. Document Released: 06/28/2011 Document Revised: 05/20/2016 Document Reviewed: 09/16/2015 Elsevier Interactive Patient Education  [employer].

## 2017-11-23 NOTE — Progress Notes (Signed)
   Subjective:    Patient ID: Deborah Barton, female    DOB: 09/11/1946, 71 y.o.   MRN: [id number]  HPI Here for medicare wellness and physical, no new complaints. Please see A/P for status and treatment of chronic medical problems.   Diet: heart healthy Physical activity: sedentary Depression/mood screen: negative Hearing: intact to whispered voice Visual acuity: grossly normal, cataracts, performs annual eye exam  ADLs: capable Fall risk: low Home safety: good Cognitive evaluation: intact to orientation, naming, recall and repetition EOL planning: adv directives discussed  I have personally reviewed and have noted 1. The patient's medical and social history - reviewed today no changes 2. Their use of alcohol, tobacco or illicit drugs 3. Their current medications and supplements 4. The patient's functional ability including ADL's, fall risks, home safety risks and hearing or visual impairment. 5. Diet and physical activities 6. Evidence for depression or mood disorders 7. Care team reviewed and updated (available in snapshot)  Review of Systems  Constitutional: Negative.   HENT: Negative.   Eyes: Negative.   Respiratory: Negative for cough, chest tightness and shortness of breath.   Cardiovascular: Negative for chest pain, palpitations and leg swelling.  Gastrointestinal: Negative for abdominal distention, abdominal pain, constipation, diarrhea, nausea and vomiting.  Musculoskeletal: Negative.   Skin: Negative.   Neurological: Negative.   Psychiatric/Behavioral: Negative.       Objective:   Physical Exam  Constitutional: She is oriented to person, place, and time. She appears well-developed and well-nourished.  HENT:  Head: Normocephalic and atraumatic.  Eyes: EOM are normal.  Neck: Normal range of motion.  Cardiovascular: Normal rate and regular rhythm.  Pulmonary/Chest: Effort normal and breath sounds normal. No respiratory distress. She has no wheezes. She has  no rales.  Abdominal: Soft. Bowel sounds are normal. She exhibits no distension. There is no tenderness. There is no rebound.  Musculoskeletal: She exhibits no edema.  Neurological: She is alert and oriented to person, place, and time. Coordination normal.  Skin: Skin is warm and dry.  Psychiatric: She has a normal mood and affect.   Vitals:   11/23/17 0802  BP: 130/82  Pulse: 80  Temp: 98 F (36.7 C)  TempSrc: Oral  SpO2: 98%  Weight: 135 lb (61.2 kg)  Height: 5\' 2"  (1.575 m)   EKG: Rate 77, axis normal, intervals normal, sinus, no st or t wave changes, no change when compared to 2018 April    Assessment & Plan:  Flu shot given at visit

## 2017-11-23 NOTE — Assessment & Plan Note (Signed)
Taking cymbalta with good results.

## 2017-11-23 NOTE — Assessment & Plan Note (Signed)
EKG done today without changes. Declines hep c screening. Flu shot given at visit. Colonoscopy due in  2019. Mammogram due in 2020. Pneumonia and tetanus up to date. Counseled about sun safety and mole surveillance. Given 10 year screening recommendations.

## 2017-11-23 NOTE — Addendum Note (Signed)
Addended by: Raford Pitcher R on: 11/23/2017 09:12 AM   Modules accepted: Orders

## 2017-11-28 DIAGNOSIS — H16223 Keratoconjunctivitis sicca, not specified as Sjogren's, bilateral: Secondary | ICD-10-CM | POA: Diagnosis not present

## 2017-11-28 DIAGNOSIS — H524 Presbyopia: Secondary | ICD-10-CM | POA: Diagnosis not present

## 2017-11-28 DIAGNOSIS — H40013 Open angle with borderline findings, low risk, bilateral: Secondary | ICD-10-CM | POA: Diagnosis not present

## 2017-11-28 DIAGNOSIS — H04123 Dry eye syndrome of bilateral lacrimal glands: Secondary | ICD-10-CM | POA: Diagnosis not present

## 2018-01-05 ENCOUNTER — Ambulatory Visit (INDEPENDENT_AMBULATORY_CARE_PROVIDER_SITE_OTHER): Payer: Medicare Other | Admitting: Family Medicine

## 2018-01-05 ENCOUNTER — Ambulatory Visit (INDEPENDENT_AMBULATORY_CARE_PROVIDER_SITE_OTHER)
Admission: RE | Admit: 2018-01-05 | Discharge: 2018-01-05 | Disposition: A | Discharge status: Home / Self Care | Payer: Medicare Other | Source: Ambulatory Visit | Attending: Family Medicine | Admitting: Family Medicine

## 2018-01-05 ENCOUNTER — Encounter: Payer: Self-pay | Admitting: Family Medicine

## 2018-01-05 VITALS — BP 132/76 | HR 69 | Temp 98.4°F | Ht 62.0 in | Wt 136.0 lb

## 2018-01-05 DIAGNOSIS — M25561 Pain in right knee: Secondary | ICD-10-CM | POA: Diagnosis not present

## 2018-01-05 DIAGNOSIS — M1711 Unilateral primary osteoarthritis, right knee: Secondary | ICD-10-CM

## 2018-01-05 DIAGNOSIS — M179 Osteoarthritis of knee, unspecified: Secondary | ICD-10-CM | POA: Insufficient documentation

## 2018-01-05 DIAGNOSIS — M1710 Unilateral primary osteoarthritis, unspecified knee: Secondary | ICD-10-CM | POA: Insufficient documentation

## 2018-01-05 NOTE — Assessment & Plan Note (Signed)
Pain in her right knee is likely related to a chronic degenerative meniscal injury versus a flare of osteoarthritis. Had an injury initially back in October with some swelling - Aspiration and injection today - X-rays - If no improvement consider physical therapy

## 2018-01-05 NOTE — Patient Instructions (Signed)
Take tylenol 650 mg three times a day is the best evidence based medicine we have for arthritis.   Glucosamine sulfate 750mg  twice a day is a supplement that has been shown to help moderate to severe arthritis.  Vitamin D 2000 IU daily  Fish oil 2 grams daily.   Tumeric 500mg  twice daily.   Capsaicin topically up to four times a day may also help with pain. .  It's important that you continue to stay active.  Consider physical therapy to strengthen muscles around the joint that hurts to take pressure off of the joint itself.  Shoe inserts with good arch support may be helpful.  [employer] orthotics at [employer] could help.   Water aerobics and cycling with low resistance are the best two types of exercise for arthritis.

## 2018-01-05 NOTE — Progress Notes (Signed)
Deborah Barton - 72 y.o. female MRN [id number]  Date of birth: 1946-08-10  SUBJECTIVE:  Including CC & ROS.  Chief Complaint  Patient presents with  . Right knee pain    Deborah Barton is a 72 y.o. female that is following up for her left knee pain. She was previously seen in 10/10/17. She has been taking Aleve with no improvement. Pain is constant. Ambulating and sitting the pain is still present. She had a fall and had pain after that a few months ago. Pain is localized. Pain is mild to moderate. Pain is sharp. Pain is staying the same.   She was seen on 10/15 and prescribed naproxen    Review of Systems  Constitutional: Negative for fever.  Respiratory: Negative for shortness of breath.   Cardiovascular: Negative for chest pain.  Gastrointestinal: Negative for abdominal pain.  Musculoskeletal: Positive for arthralgias. Negative for gait problem.  Skin: Negative for color change.  Neurological: Negative for weakness.  Hematological: Negative for adenopathy.  Psychiatric/Behavioral: Negative for agitation.    HISTORY: Past Medical, Surgical, Social, and Family History Reviewed & Updated per EMR.   Pertinent Historical Findings include:  Past Medical History:  Diagnosis Date  . Acid reflux   . Anxiety   . Cataract   . Hypertension   . Neuropathy     Past Surgical History:  Procedure Laterality Date  . ABDOMINAL HYSTERECTOMY  1994  . CHOLECYSTECTOMY  1975    No Known Allergies  Family History  Problem Relation Age of Onset  . Anemia Other   . Arthritis Other   . Asthma Other   . Depression Other   . Hypertension Other   . Stroke Other   . Cancer Sister        uterine  . Cancer Maternal Aunt        ovarian  . Cancer Maternal Grandmother        ovarian   . Breast cancer Paternal Grandmother   . Colon cancer Neg Hx   . Esophageal cancer Neg Hx   . Stomach cancer Neg Hx   . Rectal cancer Neg Hx      Social History   Socioeconomic History  .  Marital status: Divorced    Spouse name: Not on file  . Number of children: Not on file  . Years of education: Not on file  . Highest education level: Not on file  Social Needs  . Financial resource strain: Not on file  . Food insecurity - worry: Not on file  . Food insecurity - inability: Not on file  . Transportation needs - medical: Not on file  . Transportation needs - non-medical: Not on file  Occupational History  . Not on file  Tobacco Use  . Smoking status: Never Smoker  . Smokeless tobacco: Never Used  Substance and Sexual Activity  . Alcohol use: Yes    Alcohol/week: 1.8 oz    Types: 3 Standard drinks or equivalent per week    Comment: occasionally  . Drug use: No  . Sexual activity: Yes    Partners: Male    Comment: 1st intercourse- 36, partners- 9, current partner- 20 yrs   Other Topics Concern  . Not on file  Social History Narrative  . Not on file     PHYSICAL EXAM:  VS: BP 132/76 (BP Location: Left Arm, Patient Position: Sitting, Cuff Size: Normal)   Pulse 69   Temp 98.4 F (36.9 C) (Oral)   Ht 5'  2" (1.575 m)   Wt 136 lb (61.7 kg)   SpO2 97%   BMI 24.87 kg/m  Physical Exam Gen: NAD, alert, cooperative with exam, well-appearing ENT: normal lips, normal nasal mucosa,  Eye: normal EOM, normal conjunctiva and lids CV:  no edema, +2 pedal pulses   Resp: no accessory muscle use, non-labored,  Skin: no rashes, no areas of induration  Neuro: normal tone, normal sensation to touch Psych:  normal insight, alert and oriented MSK:  Right knee:  Obvious effusion  Limited flexion  Normal extension  Normal strength  TTP of the medial and lateral joint line.  Slight valgus instability with valgus testing  Neurovascularly intact    Limited ultrasound: right knee:  Effusion in the SPP Narrowing of the medial joint space  Outpouching of the lateral meniscus   Summary: effusion and degenerative meniscal changes   Ultrasound and interpretation by Clearance Coots, MD          Aspiration/Injection Procedure Note Deborah Barton 09-Aug-1946  Procedure: Aspiration and Injection Indications: Right knee pain   Procedure Details Consent: Risks of procedure as well as the alternatives and risks of each were explained to the (patient/caregiver).  Consent for procedure obtained. Time Out: Verified patient identification, verified procedure, site/side was marked, verified correct patient position, special equipment/implants available, medications/allergies/relevent history reviewed, required imaging and test results available.  Performed.  The area was cleaned with iodine and alcohol swabs.    The right SPP was approached in the superlateral aspect. 5 cc of 1% lidocaine without epi was used to anesthetize the tract and the skin. An 18 gauge needle was used to aspirate. A mixture of  1 cc's of 40 mg Depomedrol and 4 cc's of 1% lidocaine without epi was injected.  Ultrasound was used. Images were obtained in Long views showing the injection.    Amount of Fluid Aspirated: 64mL Character of Fluid: clear and straw colored Fluid was sent for:n/a A sterile dressing was applied.  Patient did tolerate procedure well.        ASSESSMENT & PLAN:   OA (osteoarthritis) of knee Pain in her right knee is likely related to a chronic degenerative meniscal injury versus a flare of osteoarthritis. Had an injury initially back in October with some swelling - Aspiration and injection today - X-rays - If no improvement consider physical therapy

## 2018-01-28 ENCOUNTER — Emergency Department (HOSPITAL_COMMUNITY): Payer: Medicare Other

## 2018-01-28 ENCOUNTER — Other Ambulatory Visit: Payer: Self-pay

## 2018-01-28 ENCOUNTER — Encounter (HOSPITAL_COMMUNITY): Payer: Self-pay

## 2018-01-28 ENCOUNTER — Emergency Department (HOSPITAL_COMMUNITY)
Admission: EM | Admit: 2018-01-28 | Discharge: 2018-01-28 | Disposition: A | Discharge status: Home / Self Care | Payer: Medicare Other | Attending: Emergency Medicine | Admitting: Emergency Medicine

## 2018-01-28 DIAGNOSIS — J189 Pneumonia, unspecified organism: Secondary | ICD-10-CM | POA: Diagnosis not present

## 2018-01-28 DIAGNOSIS — Z79899 Other long term (current) drug therapy: Secondary | ICD-10-CM | POA: Diagnosis not present

## 2018-01-28 DIAGNOSIS — R0781 Pleurodynia: Secondary | ICD-10-CM | POA: Diagnosis not present

## 2018-01-28 DIAGNOSIS — J181 Lobar pneumonia, unspecified organism: Secondary | ICD-10-CM | POA: Diagnosis not present

## 2018-01-28 DIAGNOSIS — I10 Essential (primary) hypertension: Secondary | ICD-10-CM | POA: Diagnosis not present

## 2018-01-28 DIAGNOSIS — R079 Chest pain, unspecified: Secondary | ICD-10-CM | POA: Diagnosis not present

## 2018-01-28 LAB — BASIC METABOLIC PANEL
Anion gap: 6 (ref 5–15)
BUN: 9 mg/dL (ref 6–20)
CO2: 30 mmol/L (ref 22–32)
Calcium: 9.5 mg/dL (ref 8.9–10.3)
Chloride: 103 mmol/L (ref 101–111)
Creatinine, Ser: 0.77 mg/dL (ref 0.44–1.00)
GFR calc Af Amer: >60 mL/min (ref >60)
GFR calc non Af Amer: >60 mL/min (ref >60)
Glucose, Bld: 93 mg/dL (ref 65–99)
Potassium: 3.8 mmol/L (ref 3.5–5.1)
Sodium: 139 mmol/L (ref 135–145)

## 2018-01-28 LAB — CBC
HCT: 34.7 % — ABNORMAL LOW (ref 36.0–46.0)
Hemoglobin: 11.3 g/dL — ABNORMAL LOW (ref 12.0–15.0)
MCH: 23.5 pg — ABNORMAL LOW (ref 26.0–34.0)
MCHC: 32.6 g/dL (ref 30.0–36.0)
MCV: 72.3 fL — ABNORMAL LOW (ref 78.0–100.0)
Platelets: 284 10*3/uL (ref 150–400)
RBC: 4.8 MIL/uL (ref 3.87–5.11)
RDW: 15.4 % (ref 11.5–15.5)
WBC: 6.4 10*3/uL (ref 4.0–10.5)

## 2018-01-28 LAB — I-STAT TROPONIN, ED
Troponin i, poc: 0 ng/mL (ref 0.00–0.08)
Troponin i, poc: 0 ng/mL (ref 0.00–0.08)

## 2018-01-28 LAB — D-DIMER, QUANTITATIVE (NOT AT ARMC): D-Dimer, Quant: 0.58 ug/mL-FEU — ABNORMAL HIGH (ref 0.00–0.50)

## 2018-01-28 MED ORDER — AZITHROMYCIN 250 MG PO TABS: 250.0000 mg | ORAL_TABLET | Freq: Every day | ORAL | 0 refills | Status: DC

## 2018-01-28 MED ORDER — KETOROLAC TROMETHAMINE 15 MG/ML IJ SOLN
15.0000 mg | Freq: Once | INTRAMUSCULAR | Status: AC
  Administered 2018-01-28: 15 mg via INTRAVENOUS
  Filled 2018-01-28: qty 1

## 2018-01-28 MED ORDER — ACETAMINOPHEN 500 MG PO TABS
1000.0000 mg | ORAL_TABLET | Freq: Once | ORAL | Status: AC
  Administered 2018-01-28: 1000 mg via ORAL
  Filled 2018-01-28: qty 2

## 2018-01-28 NOTE — ED Notes (Signed)
Pt ambulated in hall and the lowest O2 was 93%

## 2018-01-28 NOTE — ED Provider Notes (Signed)
[facility] DEPT Provider Note   CSN: [id number] Arrival date & time: 01/28/18  1127     History   Chief Complaint No chief complaint on file.   HPI Deborah Barton is a 72 y.o. female history of hypertension here for eval gradually worsening, sharp, nonradiating left rib cage pain 3 days. Pain is localized to the area below her left breast. Aggravating factors include moving, palpation, taking deep breaths. Alleviating factors include rest. Pain began after an episode of acid reflux after she ate a large, spicy meal. She took Alka-Seltzer plus, Tums  and pain improved. Howver went to work this morning and pain came back. No other interventions PTA. She denies fevers, chills, cold symptoms, cough, exertional chest pain or shortness of breath, abdominal pain, nausea, vomiting, palpitations, dizziness, orthopnea, PND. Traveled to [place] his 2 months ago.  she denies history of DVT/PE, recent leg swelling or calf pain, estrogen use, malignancy. No cardiac history. No recent hospitalizations.   HPI  Past Medical History:  Diagnosis Date  . Acid reflux   . Anxiety   . Cataract   . Hypertension   . Neuropathy     Patient Active Problem List   Diagnosis Date Noted  . OA (osteoarthritis) of knee 01/05/2018  . Routine general medical examination at a health care facility 11/04/2015  . Essential hypertension 01/27/2015  . Hereditary and idiopathic peripheral neuropathy 01/27/2015  . Generalized anxiety disorder 01/27/2015  . 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 ABDOMINAL HYSTERECTOMY  1994  . CHOLECYSTECTOMY  1975    OB History    Gravida Para Term Preterm AB Living   2 2       2    SAB TAB Ectopic Multiple Live Births                   Home Medications    Prior to Admission medications   Medication Sig Start Date End Date Taking? Authorizing Provider  acetaminophen (TYLENOL) 500  MG tablet Take 1,000 mg by mouth every 6 (six) hours as needed for moderate pain.   Yes [provider]  Ascorbic Acid (VITAMIN C) 1000 MG tablet Take 1,000 mg by mouth daily.   Yes [provider]  docusate sodium (COLACE) 100 MG capsule Take 100 mg by mouth daily as needed for mild constipation.   Yes [provider]  DULoxetine (CYMBALTA) 60 MG capsule Take 1 capsule (60 mg total) by mouth daily. Annual appt due in Nov must see provider for refills 09/19/17  Yes Hoyt Koch, MD  hydroxypropyl methylcellulose / hypromellose (ISOPTO TEARS / GONIOVISC) 2.5 % ophthalmic solution Place 1 drop into both eyes as needed for dry eyes.   Yes [provider]  ibuprofen (ADVIL,MOTRIN) 200 MG tablet Take 400 mg by mouth every 6 (six) hours as needed for headache.    Yes [provider]  LORazepam (ATIVAN) 1 MG tablet Take 1 tablet (1 mg total) by mouth at bedtime as needed for anxiety. 08/05/17  Yes Golden Circle, FNP  Multiple Vitamin (MULTIVITAMIN WITH MINERALS) TABS tablet Take 1 tablet by mouth daily. Adult 50+ vit.   Yes [provider]  Omega-3 Fatty Acids (FISH OIL PO) Take 1 capsule by mouth daily.   Yes [provider]  ranitidine (ZANTAC) 150 MG tablet Take 150 mg by mouth daily as needed for heartburn (indegestion).    Yes [provider]  azithromycin (ZITHROMAX) 250 MG tablet Take 1 tablet (250 mg total) by mouth daily. Take first 2 tablets together, then 1 every day until finished. 01/28/18   Kinnie Feil, PA-C  naproxen (NAPROSYN) 500 MG tablet Take 1 tablet (500 mg total) by mouth 2 (two) times daily with a meal. Patient not taking: Reported on 01/28/2018 10/10/17 10/10/18  Rosemarie Ax, MD    Family History Family History  Problem Relation Age of Onset  . Anemia Other   . Arthritis Other   . Asthma Other   . Depression Other   . Hypertension Other   . Stroke Other   . Cancer Sister         uterine  . Cancer Maternal Aunt        ovarian  . Cancer Maternal Grandmother        ovarian   . Breast cancer Paternal Grandmother   . Colon cancer Neg Hx   . Esophageal cancer Neg Hx   . Stomach cancer Neg Hx   . Rectal cancer Neg Hx     Social History Social History   Tobacco Use  . Smoking status: Never Smoker  . Smokeless tobacco: Never Used  Substance Use Topics  . Alcohol use: Yes    Alcohol/week: 1.8 oz    Types: 3 Standard drinks or equivalent per week    Comment: occasionally  . Drug use: No     Allergies   Patient has no known allergies.   Review of Systems Review of Systems  Cardiovascular: Positive for chest pain.  All other systems reviewed and are negative.    Physical Exam Updated Vital Signs BP 130/71   Pulse 74   Temp 97.9 F (36.6 C) (Oral)   Resp 17   SpO2 96%   Physical Exam  Constitutional: She appears well-developed and well-nourished.  NAD. Non toxic.   HENT:  Head: Normocephalic and atraumatic.  Nose: Nose normal.  Moist mucous membranes. Tonsils and oropharynx normal  Eyes: Conjunctivae, EOM and lids are normal.  Neck: Trachea normal and normal range of motion.  Tender left submandibular and right posterior cervical lymphadenopathy   Cardiovascular: Normal rate, regular rhythm, S1 normal, S2 normal and normal heart sounds.  Pulses:      Carotid pulses are 2+ on the right side, and 2+ on the left side.      Radial pulses are 2+ on the right side, and 2+ on the left side.       Dorsalis pedis pulses are 2+ on the right side, and 2+ on the left side.  RRR. No orthopnea. No LE edema or calf tenderness.   Pulmonary/Chest: Effort normal. No respiratory distress. She has decreased breath sounds in the left lower field. She has no rhonchi. She exhibits tenderness.  +Reproducible chest wall tenderness to area below left nipple, pain reproducible with AROM of upper extremities. Decreased breath sounds to this area. No rales or wheezing.    Abdominal: Soft. Bowel sounds are normal. There is no tenderness.  No epigastric tenderness. No distention.   Neurological: She is alert. GCS eye subscore is 4. GCS verbal subscore is 5. GCS motor subscore is 6.  Skin: Skin is warm and dry. Capillary refill takes less than 2 seconds.  No rash to chest wall  Psychiatric: She has a normal mood and affect. Her speech is normal and behavior is normal. Judgment and thought content normal. Cognition and memory are normal.     ED  Treatments / Results  Labs (all labs ordered are listed, but only abnormal results are displayed) Labs Reviewed  CBC - Abnormal; Notable for the following components:      Result Value   Hemoglobin 11.3 (*)    HCT 34.7 (*)    MCV 72.3 (*)    MCH 23.5 (*)    All other components within normal limits  D-DIMER, QUANTITATIVE (NOT AT [facility]) - Abnormal; Notable for the following components:   D-Dimer, Quant 0.58 (*)    All other components within normal limits  BASIC METABOLIC PANEL  I-STAT TROPONIN, ED  I-STAT TROPONIN, ED    EKG  EKG Interpretation  Date/Time:  Saturday January 28 2018 12:05:01 EST Ventricular Rate:  76 PR Interval:    QRS Duration: 85 QT Interval:  362 QTC Calculation: 407 R Axis:   69 Text Interpretation:  Sinus rhythm Multiple ventricular premature complexes Consider left ventricular hypertrophy No significant change since last tracing Confirmed by Duffy Bruce [phone]) on 01/28/2018 3:20:54 PM       Radiology Dg Chest 2 View  Result Date: 01/28/2018 CLINICAL DATA:  Patient coming from home with c/o chest pain that started wed and progressively getting worse today. States pain is located posterior to left breast. Patient also c/o nausea but denies any vomiting or diarrhea at this time. H/o HTN. Nonsmoker. EXAM: CHEST - 2 VIEW COMPARISON:  04/03/2015 FINDINGS: Patchy interstitial and airspace infiltrate laterally at the left lung base, new since previous. There is also a small left  pleural effusion . Right lung remains clear. Heart size and mediastinal contours are within normal limits. Aortic Atherosclerosis (ICD10-170.0) No pneumothorax Visualized bones unremarkable. IMPRESSION: 1. Patchy left lower lobe infiltrate with small effusion, new since prior study Electronically Signed   By: Lucrezia Europe M.D.   On: 01/28/2018 12:53    Procedures Procedures (including critical care time)  Medications Ordered in ED Medications  ketorolac (TORADOL) 15 MG/ML injection 15 mg (not administered)  acetaminophen (TYLENOL) tablet 1,000 mg (1,000 mg Oral Given 01/28/18 1558)     Initial Impression / Assessment and Plan / ED Course  I have reviewed the triage vital signs and the nursing notes.  Pertinent labs & imaging results that were available during my care of the patient were reviewed by me and considered in my medical decision making (see chart for details).    72 year old with hypertension here for positional, reproducible, pleuritic chest pain. No exertional or infectious symptomatology with it. She has no cough. Has tender cervical lymphadenopathy.  Considering ACS, pneumonia, musculoskeletal etiology, PE. However chest x-ray shows left sided infiltrate to area where she has pain. On exam she has decreased breath sounds to this area and reproducible chest wall pain. She traveled to [place] 2 months ago however she has no leg swelling, calf pain, SOB, tachypnea. PE unlikely. Given age and risk we'll get delta troponin, ambulate and check pulse oximetry. Her heart score is a 3, very borderline LVH on EKG. Anticipate discharge with antibiotics and close follow-up with PCP.  Final Clinical Impressions(s) / ED Diagnoses   Delta troponin and age-adjusted d-dimer negative. Patient ambulated without hypoxia. We'll discharge with Cipro mycin. Given age and risk for complication, will recommend she is reevaluated by PCP in 2-3 days. Patient verbalized understanding and agreeable. Patient  started with supervising physician. Final diagnoses:  Community acquired pneumonia of left lower lobe of lung [facility])    ED Discharge Orders        Ordered  azithromycin (ZITHROMAX) 250 MG tablet  Daily     01/28/18 1706       Arlean Hopping 01/28/18 1709    Duffy Bruce, MD 01/28/18 [phone]

## 2018-01-28 NOTE — ED Notes (Signed)
EKG given to dr. Billy Fischer.

## 2018-01-28 NOTE — ED Triage Notes (Signed)
Patient coming from home with c/o chest pain that started wed and progressively getting worse today. Patient also c/o nausea but denies any vomiting or diarrhea at this time.

## 2018-01-28 NOTE — ED Provider Notes (Signed)
Medical screening examination/treatment/procedure(s) were conducted as a shared visit with non-physician practitioner(s) and myself.  I personally evaluated the patient during the encounter. Briefly, the patient is a 72 year old female here with left-sided pleuritic chest pain.  She has had a mild cough with reactive lymph nodes in her anterior cervical lymph chain.  Chest x-ray shows left-sided consolidation and effusion.  D-dimer is negative when age-adjusted.  Troponins negative x2 and EKG is nonischemic.  She is remarkably well-appearing and in no distress.  She is satting well on room air.  I suspect she likely has a mild, early, community-acquired pneumonia with subsequent pleurisy.  Will treat her as such.  No recent antibiotic use.  She has a low risk curb 65 and pneumonia severity index.  Will treat as outpatient with good return precautions..   EKG Interpretation  Date/Time:  Saturday January 28 2018 12:05:01 EST Ventricular Rate:  76 PR Interval:    QRS Duration: 85 QT Interval:  362 QTC Calculation: 407 R Axis:   69 Text Interpretation:  Sinus rhythm Multiple ventricular premature complexes Consider left ventricular hypertrophy No significant change since last tracing Confirmed by Duffy Bruce [phone]) on 01/28/2018 3:20:54 PM          Duffy Bruce, MD 01/28/18 1704

## 2018-01-28 NOTE — Discharge Instructions (Signed)
Your symptoms are most likely from pneumonia. Your chest x-ray shows infection in the area where you have pain.  Your other labs, EKG, heart enzymes are normal.   Take antibiotic as prescribed. For pain, take 1000 mg tylenol every 6 hours. Do not exceed 4,000 mg tylenol in 24 hours.  You may use flonase, mucinex for associated nasal congestion, cough, phlegm.  Given risk of complications, make an appointment with your primary care doctor for re-evaluation in 3-5 days.   Return for worsening symptoms, fevers, shortness of breath.

## 2018-01-28 NOTE — ED Notes (Signed)
Bed: WA02 Expected date:  Expected time:  Means of arrival:  Comments: 

## 2018-01-31 ENCOUNTER — Telehealth: Payer: Self-pay

## 2018-01-31 NOTE — Telephone Encounter (Signed)
Copied from [place]. Topic: Inquiry >> Jan 31, 2018  3:01 PM Corie Chiquito, [place] wrote: Reason for CRM: Patient calling just to let Dr.Crawford know that she was seen in the hospital this Sat. Stated that she has pneumonia in her left lobe. Patient had a EKG done as well as a chest x-ray. Patient said that she is starting to fell better and she is taking her medications. Wanted to know if she should make an appointment when she is done taking her medication. If someone could give her a call back about this at [phone]

## 2018-01-31 NOTE — Telephone Encounter (Signed)
Called patient and let her know to finish ABX and then call to make an appointment

## 2018-02-05 ENCOUNTER — Other Ambulatory Visit: Payer: Self-pay | Admitting: Internal Medicine

## 2018-02-06 ENCOUNTER — Telehealth: Payer: Self-pay

## 2018-02-06 ENCOUNTER — Encounter: Payer: Self-pay | Admitting: Internal Medicine

## 2018-02-06 ENCOUNTER — Ambulatory Visit (INDEPENDENT_AMBULATORY_CARE_PROVIDER_SITE_OTHER): Payer: Medicare Other | Admitting: Internal Medicine

## 2018-02-06 ENCOUNTER — Ambulatory Visit (INDEPENDENT_AMBULATORY_CARE_PROVIDER_SITE_OTHER)
Admission: RE | Admit: 2018-02-06 | Discharge: 2018-02-06 | Disposition: A | Discharge status: Home / Self Care | Payer: Medicare Other | Source: Ambulatory Visit | Attending: Internal Medicine | Admitting: Internal Medicine

## 2018-02-06 VITALS — BP 130/84 | HR 74 | Temp 97.7°F | Ht 62.0 in | Wt 137.0 lb

## 2018-02-06 DIAGNOSIS — F411 Generalized anxiety disorder: Secondary | ICD-10-CM

## 2018-02-06 DIAGNOSIS — J181 Lobar pneumonia, unspecified organism: Secondary | ICD-10-CM | POA: Diagnosis not present

## 2018-02-06 DIAGNOSIS — J189 Pneumonia, unspecified organism: Secondary | ICD-10-CM

## 2018-02-06 DIAGNOSIS — R079 Chest pain, unspecified: Secondary | ICD-10-CM | POA: Diagnosis not present

## 2018-02-06 MED ORDER — AZITHROMYCIN 250 MG PO TABS: ORAL_TABLET | ORAL | 0 refills | Status: DC

## 2018-02-06 MED ORDER — LORAZEPAM 1 MG PO TABS: 1.0000 mg | ORAL_TABLET | Freq: Every evening | ORAL | 0 refills | Status: DC | PRN

## 2018-02-06 NOTE — Telephone Encounter (Signed)
Copied from [place] [id number]. Topic: Quick Communication - Lab Results >> Feb 06, 2018  4:17 PM Oliver Pila B wrote: Reason for CRM: pt called to get xray results, contact pt when ready

## 2018-02-06 NOTE — Assessment & Plan Note (Signed)
Refilled lorazepam today given recent loss in her family she is more stressed. Reminded her about the risk of long term usage and dependence and she is aware and wishes to continue.

## 2018-02-06 NOTE — Patient Instructions (Signed)
We have sent in the refills today.   We have sent in azithromycin to take 2 pills today then starting tomorrow take 1 pill daily.

## 2018-02-06 NOTE — Progress Notes (Signed)
   Subjective:    Patient ID: Deborah Barton, female    DOB: July 08, 1946, 72 y.o.   MRN: [id number]  HPI The patient is a 72 YO female coming in for ER follow up. She was seen there for cough and diagnosed with pneumonia. She was given a z-pack for her pneumonia which she took. She has been off antibiotics for about 4 days now now. She was also having chest pain where she had the changes on the x-ray. She denies fevers or chills. She is not coughing anymore. She is feeling 50% better. She denies side effects from the medication. She still has some pain left and wants to know if she is better yet.   PMH, [facility], social history reviewed and updated.   Review of Systems  Constitutional: Negative.   HENT: Negative.   Eyes: Negative.   Respiratory: Positive for cough. Negative for chest tightness and shortness of breath.   Cardiovascular: Positive for chest pain. Negative for palpitations and leg swelling.  Gastrointestinal: Negative for abdominal distention, abdominal pain, constipation, diarrhea, nausea and vomiting.  Musculoskeletal: Negative.   Skin: Negative.   Neurological: Negative.   Psychiatric/Behavioral: Negative.       Objective:   Physical Exam  Constitutional: She is oriented to person, place, and time. She appears well-developed and well-nourished.  HENT:  Head: Normocephalic and atraumatic.  Eyes: EOM are normal.  Neck: Normal range of motion.  Cardiovascular: Normal rate and regular rhythm.  Pulmonary/Chest: Effort normal and breath sounds normal. No respiratory distress. She has no wheezes. She has no rales. She exhibits tenderness.  Tenderness left flank/chest  Abdominal: Soft. Bowel sounds are normal. She exhibits no distension. There is no tenderness. There is no rebound.  Musculoskeletal: She exhibits no edema.  Neurological: She is alert and oriented to person, place, and time. Coordination normal.  Skin: Skin is warm and dry.  Psychiatric: She has a normal mood  and affect.   Vitals:   02/06/18 1319  BP: 130/84  Pulse: 74  Temp: 97.7 F (36.5 C)  TempSrc: Oral  SpO2: 99%  Weight: 137 lb (62.1 kg)  Height: 5\' 2"  (1.575 m)      Assessment & Plan:

## 2018-02-06 NOTE — Assessment & Plan Note (Signed)
Checking CXR today for progression versus regression and retreat if needed.

## 2018-02-07 NOTE — Telephone Encounter (Signed)
See result note.  

## 2018-04-19 ENCOUNTER — Ambulatory Visit (INDEPENDENT_AMBULATORY_CARE_PROVIDER_SITE_OTHER): Payer: Medicare Other | Admitting: Internal Medicine

## 2018-04-19 ENCOUNTER — Encounter: Payer: Self-pay | Admitting: Internal Medicine

## 2018-04-19 DIAGNOSIS — M791 Myalgia, unspecified site: Secondary | ICD-10-CM | POA: Diagnosis not present

## 2018-04-19 MED ORDER — CYCLOBENZAPRINE HCL 5 MG PO TABS: 5.0000 mg | ORAL_TABLET | Freq: Three times a day (TID) | ORAL | 1 refills | Status: DC | PRN

## 2018-04-19 MED ORDER — LORAZEPAM 1 MG PO TABS: 1.0000 mg | ORAL_TABLET | Freq: Every evening | ORAL | 2 refills | Status: DC | PRN

## 2018-04-19 NOTE — Patient Instructions (Addendum)
We have sent in flexeril to use if needed.   We have sent in the refill of the ativan.
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
## 2018-04-19 NOTE — Assessment & Plan Note (Signed)
Rx for flexeril if needed, continue tylenol. If no improvement in back pain call back and can consider x-ray. No imaging needed today as spine exam and ROM is normal and no red flag signs. No signs of concussion today.

## 2018-06-19 ENCOUNTER — Encounter: Payer: Self-pay | Admitting: Internal Medicine

## 2018-06-19 ENCOUNTER — Ambulatory Visit (INDEPENDENT_AMBULATORY_CARE_PROVIDER_SITE_OTHER): Payer: Medicare Other | Admitting: Internal Medicine

## 2018-06-19 VITALS — BP 130/84 | HR 80 | Temp 98.5°F | Ht 62.0 in | Wt 135.0 lb

## 2018-06-19 DIAGNOSIS — M791 Myalgia, unspecified site: Secondary | ICD-10-CM

## 2018-06-19 DIAGNOSIS — M79642 Pain in left hand: Secondary | ICD-10-CM

## 2018-06-19 DIAGNOSIS — R6889 Other general symptoms and signs: Secondary | ICD-10-CM

## 2018-06-19 MED ORDER — APOAEQUORIN 10 MG PO CAPS: 10.0000 mg | ORAL_CAPSULE | Freq: Every day | ORAL | 3 refills | Status: DC

## 2018-06-19 MED ORDER — CYCLOBENZAPRINE HCL 5 MG PO TABS: 5.0000 mg | ORAL_TABLET | Freq: Three times a day (TID) | ORAL | 1 refills | Status: DC | PRN

## 2018-06-19 MED ORDER — PREDNISONE 20 MG PO TABS: 40.0000 mg | ORAL_TABLET | Freq: Every day | ORAL | 0 refills | Status: DC
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
## 2018-06-19 NOTE — Patient Instructions (Signed)
We are getting you in with the physical therapy for back and neck.   We will get you in with the hand specialist.   We have sent in prednisone to take 2 pills daily for 5 days.   We have refilled the muscle relaxer.

## 2018-06-20 DIAGNOSIS — R6889 Other general symptoms and signs: Secondary | ICD-10-CM | POA: Insufficient documentation

## 2018-06-20 DIAGNOSIS — M79642 Pain in left hand: Secondary | ICD-10-CM | POA: Insufficient documentation

## 2018-06-20 NOTE — Assessment & Plan Note (Signed)
Osteophytes on exam and likely OA but she wishes to see hand specialist so referral done today. Talked about icing and using nsaids for pain if needed.

## 2018-06-20 NOTE — Assessment & Plan Note (Signed)
Refill flexeril, referral to PT, rx for prednisone to help.
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
## 2018-07-10 ENCOUNTER — Ambulatory Visit (INDEPENDENT_AMBULATORY_CARE_PROVIDER_SITE_OTHER): Payer: Medicare Other | Admitting: Physical Therapy

## 2018-07-10 ENCOUNTER — Encounter: Payer: Self-pay | Admitting: Physical Therapy

## 2018-07-10 DIAGNOSIS — M542 Cervicalgia: Secondary | ICD-10-CM

## 2018-07-10 NOTE — Patient Instructions (Signed)
Access Code: [email or web address]  URL: [email or web address]  Date: 07/10/2018  Prepared by: Lyndee Hensen   Exercises  Standing Cervical Retraction - 10 reps - 2 sets - 2x daily  Seated Cervical Sidebending Stretch - 3 reps - 30 hold - 2x daily  Seated Scapular Retraction - 10 reps - 2 sets - 2x daily
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
## 2018-07-20 ENCOUNTER — Encounter: Payer: Medicare Other | Admitting: Physical Therapy

## 2018-07-24 ENCOUNTER — Ambulatory Visit (INDEPENDENT_AMBULATORY_CARE_PROVIDER_SITE_OTHER): Payer: Medicare Other | Admitting: Physical Therapy

## 2018-07-24 ENCOUNTER — Encounter: Payer: Self-pay | Admitting: Physical Therapy

## 2018-07-24 DIAGNOSIS — M542 Cervicalgia: Secondary | ICD-10-CM | POA: Diagnosis not present

## 2018-07-24 NOTE — Therapy (Signed)
[facility] [address] [place], [place], [address] Phone: [phone]   Fax:  [phone]  Physical Therapy Treatment  Patient Details  Name: Deborah Barton MRN: [id number] Date of Birth: 1946-02-12 Referring Provider: Pricilla Holm   Encounter Date: 07/24/2018  PT End of Session - 07/24/18 1424    Visit Number  2    Number of Visits  12    Date for PT Re-Evaluation  08/21/18    Authorization Type  [employer]HC medicare    PT Start Time  4010    PT Stop Time  1430    PT Time Calculation (min)  44 min    Activity Tolerance  Patient tolerated treatment well    Behavior During Therapy  [facility] for tasks assessed/performed       Past Medical History:  Diagnosis Date  . Acid reflux   . Anxiety   . Cataract   . Hypertension   . Neuropathy     Past Surgical History:  Procedure Laterality Date  . ABDOMINAL HYSTERECTOMY  1994  . CHOLECYSTECTOMY  1975    There were no vitals filed for this visit.  Subjective Assessment - 07/24/18 1423    Subjective  Pt states neck has been sore, she has been doing stretches, does feel a bit better than it did initially.     Currently in Pain?  Yes    Pain Score  6     Pain Orientation  Left    Pain Descriptors / Indicators  Aching    Pain Type  Chronic pain    Pain Onset  More than a month ago    Pain Frequency  Intermittent                       [facility] Adult PT Treatment/Exercise - 07/24/18 1354      Exercises   Exercises  Neck      Neck Exercises: Seated   Neck Retraction  20 reps    Cervical Rotation  10 reps    Shoulder Rolls  20 reps    Other Seated Exercise  Scap Squeeze x20;       Modalities   Modalities  Moist Heat      Moist Heat Therapy   Number Minutes Moist Heat  10 Minutes    Moist Heat Location  Cervical      Manual Therapy   Manual Therapy  Soft tissue mobilization    Soft tissue mobilization  STM/DTM  to L UT and cervical paraspinals; Sub  occipital release: manual distraction 10 sec x 10;       Neck Exercises: Stretches   Upper Trapezius Stretch  3 reps;30 seconds;Left               PT Short Term Goals - 07/10/18 1656      PT SHORT TERM GOAL #1   Title  Pt to be independent with initial HEP    Time  2    Period  Weeks    Status  New    Target Date  07/24/18      PT SHORT TERM GOAL #2   Title  Pt to demo improved cervical ROM by at least 5 degrees for L rotation.     Time  2    Period  Weeks    Status  New    Target Date  07/24/18        PT Long Term Goals - 07/10/18  [place] #1   Title  Pt to demo improved cervical ROM, to be WNL , to improve ability for work duties, and driving.     Time  6    Period  Weeks    Status  New    Target Date  08/21/18      PT LONG TERM GOAL #2   Title  Pt to report decreased pain in cervical region, to 0-2/10 with sleeping, and activity     Time  6    Period  Weeks    Status  New    Target Date  08/21/18      PT LONG TERM GOAL #3   Title  Pt to be independent with final HEP     Time  6    Period  Weeks    Status  New    Target Date  08/21/18            Plan - 07/24/18 1426    Clinical Impression Statement  Pt with significant soreness palpation and DTM massage in upper traps and sub occipitals today. Will consider dry needling next visit for trigger points.     Rehab Potential  Good    PT Frequency  2x / week    PT Duration  6 weeks    PT Treatment/Interventions  ADLs/Self Care Home Management;Cryotherapy;Electrical Stimulation;Iontophoresis 4mg /ml Dexamethasone;Moist Heat;Therapeutic activities;Functional mobility training;Ultrasound;Therapeutic exercise;Neuromuscular re-education;Patient/family education;Dry needling;Passive range of motion;Manual techniques;Taping    Consulted and Agree with Plan of Care  Patient       Patient will benefit from skilled therapeutic intervention in order to improve the following deficits and  impairments:  Hypomobility, Pain, Impaired UE functional use, Decreased strength, Decreased activity tolerance, Decreased mobility, Increased muscle spasms, Improper body mechanics, Impaired flexibility, Decreased range of motion  Visit Diagnosis: Neck pain     Problem List Patient Active Problem List   Diagnosis Date Noted  . Left hand pain 06/20/2018  . Forgetfulness 06/20/2018  . Myalgia 04/19/2018  . OA (osteoarthritis) of knee 01/05/2018  . Routine general medical examination at a health care facility 11/04/2015  . Essential hypertension 01/27/2015  . Hereditary and idiopathic peripheral neuropathy 01/27/2015  . Generalized anxiety disorder 01/27/2015  . MIXED HEARING LOSS UNILATERAL 04/27/2010   Deborah Barton, PT, DPT 2:29 PM  07/24/18    [facility] [place], [place], [address] Phone: [phone]   Fax:  [phone]  Name: Deborah Barton MRN: [id number] Date of Birth: 04-Jun-1946

## 2018-07-27 ENCOUNTER — Encounter: Payer: Medicare Other | Admitting: Physical Therapy

## 2018-07-27 DIAGNOSIS — H40013 Open angle with borderline findings, low risk, bilateral: Secondary | ICD-10-CM | POA: Diagnosis not present

## 2018-07-27 DIAGNOSIS — H2513 Age-related nuclear cataract, bilateral: Secondary | ICD-10-CM | POA: Diagnosis not present

## 2018-07-27 DIAGNOSIS — H35033 Hypertensive retinopathy, bilateral: Secondary | ICD-10-CM | POA: Diagnosis not present

## 2018-07-27 DIAGNOSIS — H25013 Cortical age-related cataract, bilateral: Secondary | ICD-10-CM | POA: Diagnosis not present

## 2018-07-28 DIAGNOSIS — M79642 Pain in left hand: Secondary | ICD-10-CM | POA: Diagnosis not present

## 2018-07-28 DIAGNOSIS — M1812 Unilateral primary osteoarthritis of first carpometacarpal joint, left hand: Secondary | ICD-10-CM | POA: Diagnosis not present

## 2018-07-28 DIAGNOSIS — G5602 Carpal tunnel syndrome, left upper limb: Secondary | ICD-10-CM | POA: Diagnosis not present

## 2018-07-31 ENCOUNTER — Ambulatory Visit (INDEPENDENT_AMBULATORY_CARE_PROVIDER_SITE_OTHER): Payer: Medicare Other | Admitting: Physical Therapy

## 2018-07-31 DIAGNOSIS — M542 Cervicalgia: Secondary | ICD-10-CM

## 2018-08-01 ENCOUNTER — Encounter: Payer: Self-pay | Admitting: Physical Therapy

## 2018-08-01 NOTE — Therapy (Signed)
[facility] [address] [place], [place], [address] Phone: [phone]   Fax:  [phone]  Physical Therapy Treatment  Patient Details  Name: Deborah Barton MRN: [id number] Date of Birth: 1946-12-26 Referring Provider: Pricilla Holm   Encounter Date: 07/31/2018  PT End of Session - 08/01/18 1016    Visit Number  3    Number of Visits  12    Date for PT Re-Evaluation  08/21/18    Authorization Type  [employer]HC medicare    PT Start Time  3299    PT Stop Time  1606    PT Time Calculation (min)  51 min    Activity Tolerance  Patient tolerated treatment well    Behavior During Therapy  [facility] for tasks assessed/performed       Past Medical History:  Diagnosis Date  . Acid reflux   . Anxiety   . Cataract   . Hypertension   . Neuropathy     Past Surgical History:  Procedure Laterality Date  . ABDOMINAL HYSTERECTOMY  1994  . CHOLECYSTECTOMY  1975    There were no vitals filed for this visit.  Subjective Assessment - 08/01/18 1011    Subjective  Pt states neck was less sore after last visit, but has increased pain yesterday and today.     Currently in Pain?  Yes    Pain Score  4     Pain Location  Neck    Pain Orientation  Left    Pain Descriptors / Indicators  Aching;Tightness    Pain Type  Chronic pain    Pain Onset  More than a month ago    Pain Frequency  Intermittent                       [facility] Adult PT Treatment/Exercise - 07/31/18 1522      Exercises   Exercises  Neck      Neck Exercises: Standing   Other Standing Exercises  Rows RTB x20;       Neck Exercises: Seated   Neck Retraction  20 reps    Cervical Rotation  10 reps    Shoulder Rolls  20 reps    Other Seated Exercise  Scap Squeeze x20;       Modalities   Modalities  Moist Heat      Moist Heat Therapy   Number Minutes Moist Heat  10 Minutes    Moist Heat Location  Cervical      Manual Therapy   Manual Therapy  Soft tissue  mobilization;Joint mobilization    Manual therapy comments  skilled palpation and monitoring of soft tissue during DN    Joint Mobilization  cervical PA mobs and side glides;     Soft tissue mobilization  STM/DTM  to L UT and cervical paraspinals; Sub occipital release: manual distraction 10 sec x 10;       Neck Exercises: Stretches   Upper Trapezius Stretch  3 reps;30 seconds;Left       Trigger Point Dry Needling - 07/31/18 1544    Consent Given?  Yes    Education Handout Provided  Yes    Muscles Treated Upper Body  Upper trapezius    Upper Trapezius Response  Twitch reponse elicited;Palpable increased muscle length           PT Education - 08/01/18 1015    Education Details  On Dry needling, HEp reviewed    Person(s) Educated  Patient    Methods  Explanation;Handout    Comprehension  Verbalized understanding       PT Short Term Goals - 07/10/18 1656      PT SHORT TERM GOAL #1   Title  Pt to be independent with initial HEP    Time  2    Period  Weeks    Status  New    Target Date  07/24/18      PT SHORT TERM GOAL #2   Title  Pt to demo improved cervical ROM by at least 5 degrees for L rotation.     Time  2    Period  Weeks    Status  New    Target Date  07/24/18        PT Long Term Goals - 07/10/18 1657      PT LONG TERM GOAL #1   Title  Pt to demo improved cervical ROM, to be WNL , to improve ability for work duties, and driving.     Time  6    Period  Weeks    Status  New    Target Date  08/21/18      PT LONG TERM GOAL #2   Title  Pt to report decreased pain in cervical region, to 0-2/10 with sleeping, and activity     Time  6    Period  Weeks    Status  New    Target Date  08/21/18      PT LONG TERM GOAL #3   Title  Pt to be independent with final HEP     Time  6    Period  Weeks    Status  New    Target Date  08/21/18            Plan - 08/01/18 1017    Clinical Impression Statement  Pt with good response to dry needling today.  Manual therapy done for tightness and soreness in L Upper trap. Pt to benefit from continued care. Patient Consent: After explanation of Trigger Point Dry Needling (TDN)  rationale, procedures, outcomes, and potential side effects, patient verbalized consent to TDN treatment. Post treatment instructions: Patient instructed to expect mild to moderate muscle soreness this evening and tomorrow. Pt instructed to continue prescribed home exercise program. If dry needling over lung field, patient instructed of signs and symptoms of pneumothorax. Although unlikely, patient educated on seeking immediate medical attention, should symptoms occur. Pt verbalized understanding of these instructions.     Rehab Potential  Good    PT Frequency  2x / week    PT Duration  6 weeks    PT Treatment/Interventions  ADLs/Self Care Home Management;Cryotherapy;Electrical Stimulation;Iontophoresis 4mg /ml Dexamethasone;Moist Heat;Therapeutic activities;Functional mobility training;Ultrasound;Therapeutic exercise;Neuromuscular re-education;Patient/family education;Dry needling;Passive range of motion;Manual techniques;Taping    Consulted and Agree with Plan of Care  Patient       Patient will benefit from skilled therapeutic intervention in order to improve the following deficits and impairments:  Hypomobility, Pain, Impaired UE functional use, Decreased strength, Decreased activity tolerance, Decreased mobility, Increased muscle spasms, Improper body mechanics, Impaired flexibility, Decreased range of motion  Visit Diagnosis: Neck pain     Problem List Patient Active Problem List   Diagnosis Date Noted  . Left hand pain 06/20/2018  . Forgetfulness 06/20/2018  . Myalgia 04/19/2018  . OA (osteoarthritis) of knee 01/05/2018  . Routine general medical examination at a health care facility 11/04/2015  . Essential hypertension 01/27/2015  . Hereditary and  idiopathic peripheral neuropathy 01/27/2015  . Generalized anxiety  disorder 01/27/2015  . MIXED HEARING LOSS UNILATERAL 04/27/2010    Lyndee Hensen 08/01/2018, 10:21 AM  [facility] [address] [place], [place], [address] Phone: [phone]   Fax:  [phone]  Name: MICHOLE LECUYER MRN: [id number] Date of Birth: 06-Mar-1946

## 2018-08-03 ENCOUNTER — Encounter: Payer: Medicare Other | Admitting: Physical Therapy

## 2018-08-04 ENCOUNTER — Encounter: Payer: Medicare Other | Admitting: Physical Therapy

## 2018-08-07 ENCOUNTER — Ambulatory Visit (INDEPENDENT_AMBULATORY_CARE_PROVIDER_SITE_OTHER): Payer: Medicare Other | Admitting: Physical Therapy

## 2018-08-07 DIAGNOSIS — M542 Cervicalgia: Secondary | ICD-10-CM | POA: Diagnosis not present

## 2018-08-08 ENCOUNTER — Encounter: Payer: Self-pay | Admitting: Physical Therapy

## 2018-08-08 NOTE — Therapy (Signed)
[facility] [address] [place], [place], [address] Phone: [phone]   Fax:  [phone]  Physical Therapy Treatment  Patient Details  Name: Deborah Barton MRN: [id number] Date of Birth: [birth year] Referring Provider: Pricilla Holm   Encounter Date: 08/07/2018  PT End of Session - 08/07/18 1425    Visit Number  4    Number of Visits  12    Date for PT Re-Evaluation  08/21/18    Authorization Type  [employer] medicare    PT Start Time  6644    PT Stop Time  1438    PT Time Calculation (min)  43 min    Activity Tolerance  Patient tolerated treatment well    Behavior During Therapy  [facility] for tasks assessed/performed       Past Medical History:  Diagnosis Date  . Acid reflux   . Anxiety   . Cataract   . Hypertension   . Neuropathy     Past Surgical History:  Procedure Laterality Date  . ABDOMINAL HYSTERECTOMY  1994  . CHOLECYSTECTOMY  1975    There were no vitals filed for this visit.  Subjective Assessment - 08/08/18 0940    Subjective  Pt states decreased pain and improved neck ROM after last session with dry needling. Pt states mild increase in tightenss/soreness today .    Patient Stated Goals  Decreased pain, improved movement.     Currently in Pain?  Yes    Pain Score  2     Pain Location  Neck    Pain Orientation  Left    Pain Descriptors / Indicators  Aching;Tightness    Pain Type  Chronic pain    Pain Onset  More than a month ago    Pain Frequency  Intermittent                       [facility] Adult PT Treatment/Exercise - 08/08/18 0001      Exercises   Exercises  Neck      Neck Exercises: Standing   Other Standing Exercises  Rows RTB x20;       Neck Exercises: Seated   Neck Retraction  20 reps    Cervical Rotation  10 reps      Modalities   Modalities  Moist Heat      Moist Heat Therapy   Number Minutes Moist Heat  10 Minutes    Moist Heat Location  Cervical      Manual  Therapy   Manual Therapy  Soft tissue mobilization;Joint mobilization    Manual therapy comments  skilled palpation and monitoring of soft tissue during DN    Soft tissue mobilization  IASTM & DTM  to L UT and cervical paraspinals; sub occipital release;       Neck Exercises: Stretches   Upper Trapezius Stretch  3 reps;30 seconds;Left    Levator Stretch  3 reps;30 seconds       Trigger Point Dry Needling - 08/08/18 0347    Consent Given?  Yes    Muscles Treated Upper Body  Upper trapezius    Upper Trapezius Response  Twitch reponse elicited;Palpable increased muscle length           PT Education - 08/08/18 0940    Education Details  On dry needling, precautions, risks, benefits, HEP     Person(s) Educated  Patient    Methods  Explanation    Comprehension  Verbalized understanding  PT Short Term Goals - 07/10/18 1656      PT SHORT TERM GOAL #1   Title  Pt to be independent with initial HEP    Time  2    Period  Weeks    Status  New    Target Date  07/24/18      PT SHORT TERM GOAL #2   Title  Pt to demo improved cervical ROM by at least 5 degrees for L rotation.     Time  2    Period  Weeks    Status  New    Target Date  07/24/18        PT Long Term Goals - 07/10/18 1657      PT LONG TERM GOAL #1   Title  Pt to demo improved cervical ROM, to be WNL , to improve ability for work duties, and driving.     Time  6    Period  Weeks    Status  New    Target Date  08/21/18      PT LONG TERM GOAL #2   Title  Pt to report decreased pain in cervical region, to 0-2/10 with sleeping, and activity     Time  6    Period  Weeks    Status  New    Target Date  08/21/18      PT LONG TERM GOAL #3   Title  Pt to be independent with final HEP     Time  6    Period  Weeks    Status  New    Target Date  08/21/18            Plan - 08/08/18 0942    Clinical Impression Statement  Pt with good response to dry needling today, she requires encouragement to relax  and breathe. She has significant improvement of trigger points in L UT after session today. HEP reviewed, with proper mechanics for UT and levator stretches. Pt encouraged to continue HEP. Pt with improving cervical ROM as tightness in UT decreases.     Rehab Potential  Good    PT Frequency  2x / week    PT Duration  6 weeks    PT Treatment/Interventions  ADLs/Self Care Home Management;Cryotherapy;Electrical Stimulation;Iontophoresis 4mg /ml Dexamethasone;Moist Heat;Therapeutic activities;Functional mobility training;Ultrasound;Therapeutic exercise;Neuromuscular re-education;Patient/family education;Dry needling;Passive range of motion;Manual techniques;Taping    Consulted and Agree with Plan of Care  Patient       Patient will benefit from skilled therapeutic intervention in order to improve the following deficits and impairments:  Hypomobility, Pain, Impaired UE functional use, Decreased strength, Decreased activity tolerance, Decreased mobility, Increased muscle spasms, Improper body mechanics, Impaired flexibility, Decreased range of motion  Visit Diagnosis: Neck pain     Problem List Patient Active Problem List   Diagnosis Date Noted  . Left hand pain 06/20/2018  . Forgetfulness 06/20/2018  . Myalgia 04/19/2018  . OA (osteoarthritis) of knee 01/05/2018  . Routine general medical examination at a health care facility 11/04/2015  . Essential hypertension 01/27/2015  . Hereditary and idiopathic peripheral neuropathy 01/27/2015  . Generalized anxiety disorder 01/27/2015  . MIXED HEARING LOSS UNILATERAL 04/27/2010    Lyndee Hensen, PT, DPT 9:44 AM  08/08/18    [facility] [place] [place], [place], [address] Phone: [phone]   Fax:  [phone]  Name: Deborah [place] MRN: [id number] Date of Birth: 1946-04-22

## 2018-08-10 ENCOUNTER — Encounter: Payer: Self-pay | Admitting: Physical Therapy

## 2018-08-10 ENCOUNTER — Ambulatory Visit (INDEPENDENT_AMBULATORY_CARE_PROVIDER_SITE_OTHER): Payer: Medicare Other | Admitting: Physical Therapy

## 2018-08-10 DIAGNOSIS — M542 Cervicalgia: Secondary | ICD-10-CM

## 2018-08-10 NOTE — Therapy (Signed)
[facility], [place], [address] Phone: [phone]   Fax:  [phone]  Physical Therapy Treatment  Patient Details  Name: Deborah Barton MRN: [id number] Date of Birth: 11/05/1946 Referring Provider: Pricilla Holm   Encounter Date: 08/10/2018  PT End of Session - 08/10/18 1528    Visit Number  5    Number of Visits  12    Date for PT Re-Evaluation  08/21/18    Authorization Type  [employer] medicare    PT Start Time  2992    PT Stop Time  1523    PT Time Calculation (min)  39 min    Activity Tolerance  Patient tolerated treatment well    Behavior During Therapy  [facility] for tasks assessed/performed       Past Medical History:  Diagnosis Date  . Acid reflux   . Anxiety   . Cataract   . Hypertension   . Neuropathy     Past Surgical History:  Procedure Laterality Date  . ABDOMINAL HYSTERECTOMY  1994  . CHOLECYSTECTOMY  1975    There were no vitals filed for this visit.  Subjective Assessment - 08/10/18 1528    Subjective  Pt states decreasing pain.     Currently in Pain?  No/denies    Pain Score  0-No pain                       [facility] Adult PT Treatment/Exercise - 08/10/18 0001      Exercises   Exercises  Neck      Neck Exercises: Standing   Other Standing Exercises  Rows RTB x20;     Other Standing Exercises  UE scaption x15 with education on mechanics.       Neck Exercises: Seated   Neck Retraction  20 reps    Cervical Rotation  10 reps      Neck Exercises: Supine   Neck Retraction  20 reps    Other Supine Exercise  UE flexion with cane x20, 2lb      Modalities   Modalities  --      Moist Heat Therapy   Moist Heat Location  --      Manual Therapy   Manual Therapy  Soft tissue mobilization;Joint mobilization    Manual therapy comments  skilled palpation and monitoring of soft tissue during DN    Soft tissue mobilization  STM & DTM  to L UT and cervical paraspinals;  sub occipital release; Manual distraction 10 sec x 10;       Neck Exercises: Stretches   Upper Trapezius Stretch  30 seconds;Left;4 reps    Levator Stretch  --               PT Short Term Goals - 08/10/18 1529      PT SHORT TERM GOAL #1   Title  Pt to be independent with initial HEP    Time  2    Period  Weeks    Status  Achieved      PT SHORT TERM GOAL #2   Title  Pt to demo improved cervical ROM by at least 5 degrees for L rotation.     Time  2    Period  Weeks    Status  Achieved        PT Long Term Goals - 07/10/18 1657      PT LONG TERM GOAL #1   Title  Pt to demo improved cervical ROM, to be WNL , to improve ability for work duties, and driving.     Time  6    Period  Weeks    Status  New    Target Date  08/21/18      PT LONG TERM GOAL #2   Title  Pt to report decreased pain in cervical region, to 0-2/10 with sleeping, and activity     Time  6    Period  Weeks    Status  New    Target Date  08/21/18      PT LONG TERM GOAL #3   Title  Pt to be independent with final HEP     Time  6    Period  Weeks    Status  New    Target Date  08/21/18            Plan - 08/10/18 1530    Clinical Impression Statement  Pt 15 min late to appt. She has significant improvement of cervical ROM, with no pain with rotation. She does continue to have tenderness in sub occipital region, and mild in L UT region, but greatly improved tightness and tenderness in L UT from previous sessions. Pt educated on posture and mechanics with UE flexion today.  Plan to work towards d/c to HEP next week.     Rehab Potential  Good    PT Frequency  2x / week    PT Duration  6 weeks    PT Treatment/Interventions  ADLs/Self Care Home Management;Cryotherapy;Electrical Stimulation;Iontophoresis 4mg /ml Dexamethasone;Moist Heat;Therapeutic activities;Functional mobility training;Ultrasound;Therapeutic exercise;Neuromuscular re-education;Patient/family education;Dry needling;Passive range of  motion;Manual techniques;Taping    Consulted and Agree with Plan of Care  Patient       Patient will benefit from skilled therapeutic intervention in order to improve the following deficits and impairments:  Hypomobility, Pain, Impaired UE functional use, Decreased strength, Decreased activity tolerance, Decreased mobility, Increased muscle spasms, Improper body mechanics, Impaired flexibility, Decreased range of motion  Visit Diagnosis: Neck pain     Problem List Patient Active Problem List   Diagnosis Date Noted  . Left hand pain 06/20/2018  . Forgetfulness 06/20/2018  . Myalgia 04/19/2018  . OA (osteoarthritis) of knee 01/05/2018  . Routine general medical examination at a health care facility 11/04/2015  . Essential hypertension 01/27/2015  . Hereditary and idiopathic peripheral neuropathy 01/27/2015  . Generalized anxiety disorder 01/27/2015  . MIXED HEARING LOSS UNILATERAL 04/27/2010    Lyndee Hensen, PT, DPT 3:36 PM  08/10/18    [facility] [place], [place], [address] Phone: [phone]   Fax:  [phone]  Name: NYKIAH MA MRN: [id number] Date of Birth: 1946-12-14

## 2018-08-14 ENCOUNTER — Encounter: Payer: Self-pay | Admitting: Physical Therapy

## 2018-08-14 ENCOUNTER — Ambulatory Visit (INDEPENDENT_AMBULATORY_CARE_PROVIDER_SITE_OTHER): Payer: Medicare Other | Admitting: Physical Therapy

## 2018-08-14 DIAGNOSIS — M542 Cervicalgia: Secondary | ICD-10-CM

## 2018-08-15 NOTE — Therapy (Signed)
[facility] [address] [place], [place], [address] Phone: [phone]   Fax:  [phone]  Physical Therapy Treatment  Patient Details  Name: Deborah Barton MRN: [id number] Date of Birth: [birth year] Referring Provider: Pricilla Holm   Encounter Date: 08/14/2018  PT End of Session - 08/14/18 1310    Visit Number  6    Number of Visits  12    Date for PT Re-Evaluation  08/21/18    Authorization Type  [employer] medicare    PT Start Time  7322    PT Stop Time  1345    PT Time Calculation (min)  40 min    Activity Tolerance  Patient tolerated treatment well    Behavior During Therapy  [facility] for tasks assessed/performed       Past Medical History:  Diagnosis Date  . Acid reflux   . Anxiety   . Cataract   . Hypertension   . Neuropathy     Past Surgical History:  Procedure Laterality Date  . ABDOMINAL HYSTERECTOMY  1994  . CHOLECYSTECTOMY  1975    There were no vitals filed for this visit.  Subjective Assessment - 08/14/18 1309    Subjective  Pt states decreasing pain. She continues to have soreness in L cervical region with ROM and sleeping.     Currently in Pain?  Yes    Pain Score  3     Pain Orientation  Left    Pain Descriptors / Indicators  Aching;Tightness    Pain Type  Chronic pain    Pain Onset  More than a month ago    Pain Frequency  Intermittent                       [facility] Adult PT Treatment/Exercise - 08/14/18 1311      Exercises   Exercises  Neck      Neck Exercises: Standing   Other Standing Exercises  Rows RTB x20;     Other Standing Exercises  UE scaption x15 with education on mechanics.       Neck Exercises: Seated   Neck Retraction  20 reps    Cervical Rotation  10 reps      Neck Exercises: Supine   Neck Retraction  --    Other Supine Exercise  UE flexion with cane x20, 2lb      Manual Therapy   Manual Therapy  Soft tissue mobilization;Joint mobilization    Manual  therapy comments  skilled palpation and monitoring of soft tissue during DN    Joint Mobilization  side glides;     Soft tissue mobilization  STM & DTM  to L UT and cervical paraspinals; sub occipital release; Manual distraction 10 sec x 10;       Neck Exercises: Stretches   Upper Trapezius Stretch  30 seconds;Left;4 reps    Levator Stretch  3 reps;30 seconds       Trigger Point Dry Needling - 08/15/18 1342    Consent Given?  Yes    Muscles Treated Upper Body  Upper trapezius    Upper Trapezius Response  Twitch reponse elicited;Palpable increased muscle length   Bilateral            PT Short Term Goals - 08/10/18 1529      PT SHORT TERM GOAL #1   Title  Pt to be independent with initial HEP    Time  2    Period  Weeks    Status  Achieved      PT SHORT TERM GOAL #2   Title  Pt to demo improved cervical ROM by at least 5 degrees for L rotation.     Time  2    Period  Weeks    Status  Achieved        PT Long Term Goals - 07/10/18 1657      PT LONG TERM GOAL #1   Title  Pt to demo improved cervical ROM, to be WNL , to improve ability for work duties, and driving.     Time  6    Period  Weeks    Status  New    Target Date  08/21/18      PT LONG TERM GOAL #2   Title  Pt to report decreased pain in cervical region, to 0-2/10 with sleeping, and activity     Time  6    Period  Weeks    Status  New    Target Date  08/21/18      PT LONG TERM GOAL #3   Title  Pt to be independent with final HEP     Time  6    Period  Weeks    Status  New    Target Date  08/21/18            Plan - 08/15/18 1336    Clinical Impression Statement  Pt with pain improvements, but continues to have soreness and tightness in L cervical region with palpation and ROM.  Recommend continued care for decreasing pain , tightness, and restoring ROM.     Rehab Potential  Good    PT Frequency  2x / week    PT Duration  6 weeks    PT Treatment/Interventions  ADLs/Self Care Home  Management;Cryotherapy;Electrical Stimulation;Iontophoresis 4mg /ml Dexamethasone;Moist Heat;Therapeutic activities;Functional mobility training;Ultrasound;Therapeutic exercise;Neuromuscular re-education;Patient/family education;Dry needling;Passive range of motion;Manual techniques;Taping    Consulted and Agree with Plan of Care  Patient       Patient will benefit from skilled therapeutic intervention in order to improve the following deficits and impairments:  Hypomobility, Pain, Impaired UE functional use, Decreased strength, Decreased activity tolerance, Decreased mobility, Increased muscle spasms, Improper body mechanics, Impaired flexibility, Decreased range of motion  Visit Diagnosis: Neck pain     Problem List Patient Active Problem List   Diagnosis Date Noted  . Left hand pain 06/20/2018  . Forgetfulness 06/20/2018  . Myalgia 04/19/2018  . OA (osteoarthritis) of knee 01/05/2018  . Routine general medical examination at a health care facility 11/04/2015  . Essential hypertension 01/27/2015  . Hereditary and idiopathic peripheral neuropathy 01/27/2015  . Generalized anxiety disorder 01/27/2015  . MIXED HEARING LOSS UNILATERAL 04/27/2010   Lyndee Hensen, PT, DPT 1:43 PM  08/15/18   [facility] [place], [place], [address] Phone: [phone]   Fax:  [phone]  Name: Deborah Barton MRN: [id number] Date of Birth: 02-16-1946

## 2018-08-17 ENCOUNTER — Ambulatory Visit (INDEPENDENT_AMBULATORY_CARE_PROVIDER_SITE_OTHER): Payer: Medicare Other | Admitting: Physical Therapy

## 2018-08-17 ENCOUNTER — Encounter: Payer: Self-pay | Admitting: Physical Therapy

## 2018-08-17 DIAGNOSIS — M542 Cervicalgia: Secondary | ICD-10-CM

## 2018-08-17 NOTE — Therapy (Signed)
[facility] [address] [place], [place], [address] Phone: [phone]   Fax:  [phone]  Physical Therapy Treatment  Patient Details  Name: Deborah Barton MRN: [id number] Date of Birth: February 05, 1946 Referring Provider: Pricilla Holm   Encounter Date: 08/17/2018  PT End of Session - 08/17/18 1432    Visit Number  7    Number of Visits  12    Date for PT Re-Evaluation  08/21/18    Authorization Type  [employer] medicare    PT Start Time  2706    PT Stop Time  1428    PT Time Calculation (min)  43 min    Activity Tolerance  Patient tolerated treatment well    Behavior During Therapy  [facility] for tasks assessed/performed       Past Medical History:  Diagnosis Date  . Acid reflux   . Anxiety   . Cataract   . Hypertension   . Neuropathy     Past Surgical History:  Procedure Laterality Date  . ABDOMINAL HYSTERECTOMY  1994  . CHOLECYSTECTOMY  1975    There were no vitals filed for this visit.  Subjective Assessment - 08/17/18 1345    Subjective  Pt states decreased soreness after last visit, L side still sore.     Currently in Pain?  Yes    Pain Score  2     Pain Location  Neck    Pain Orientation  Left    Pain Descriptors / Indicators  Aching;Tightness    Pain Type  Chronic pain    Pain Onset  More than a month ago    Pain Frequency  Intermittent                       [facility] Adult PT Treatment/Exercise - 08/17/18 1345      Exercises   Exercises  Neck      Neck Exercises: Standing   Wall Push Ups  15 reps    Other Standing Exercises  Rows RTB x20; Low Row RTB x20;      Other Standing Exercises  UE scaption and Abd x 10 each;       Neck Exercises: Seated   Neck Retraction  20 reps    Cervical Rotation  10 reps      Neck Exercises: Supine   Other Supine Exercise  --      Neck Exercises: Sidelying   Other Sidelying Exercise  Shoulder Abd x15 bil;     Other Sidelying Exercise  Horizonal Abd x15  bil;       Manual Therapy   Manual Therapy  Soft tissue mobilization;Joint mobilization    Manual therapy comments  skilled palpation and monitoring of soft tissue during DN    Joint Mobilization  side glides;     Soft tissue mobilization  STM & DTM  to L UT and cervical paraspinals; sub occipital release; Manual distraction 10 sec x 10;       Neck Exercises: Stretches   Upper Trapezius Stretch  30 seconds;Left;4 reps    Levator Stretch  3 reps;30 seconds       Trigger Point Dry Needling - 08/17/18 1431    Consent Given?  Yes    Muscles Treated Upper Body  Upper trapezius    Upper Trapezius Response  Twitch reponse elicited;Palpable increased muscle length   Left            PT Short Term Goals -  08/10/18 1529      PT SHORT TERM GOAL #1   Title  Pt to be independent with initial HEP    Time  2    Period  Weeks    Status  Achieved      PT SHORT TERM GOAL #2   Title  Pt to demo improved cervical ROM by at least 5 degrees for L rotation.     Time  2    Period  Weeks    Status  Achieved        PT Long Term Goals - 07/10/18 1657      PT LONG TERM GOAL #1   Title  Pt to demo improved cervical ROM, to be WNL , to improve ability for work duties, and driving.     Time  6    Period  Weeks    Status  New    Target Date  08/21/18      PT LONG TERM GOAL #2   Title  Pt to report decreased pain in cervical region, to 0-2/10 with sleeping, and activity     Time  6    Period  Weeks    Status  New    Target Date  08/21/18      PT LONG TERM GOAL #3   Title  Pt to be independent with final HEP     Time  6    Period  Weeks    Status  New    Target Date  08/21/18            Plan - 08/17/18 1433    Clinical Impression Statement  Pt requires cueing for mechanics with postural exercises today, to decrease upper trap compensation. Manual therapy and dry needling  done for muscle tightness and pain. Pt to benefit from continued care.     Rehab Potential  Good    PT  Frequency  2x / week    PT Duration  6 weeks    PT Treatment/Interventions  ADLs/Self Care Home Management;Cryotherapy;Electrical Stimulation;Iontophoresis 4mg /ml Dexamethasone;Moist Heat;Therapeutic activities;Functional mobility training;Ultrasound;Therapeutic exercise;Neuromuscular re-education;Patient/family education;Dry needling;Passive range of motion;Manual techniques;Taping    Consulted and Agree with Plan of Care  Patient       Patient will benefit from skilled therapeutic intervention in order to improve the following deficits and impairments:  Hypomobility, Pain, Impaired UE functional use, Decreased strength, Decreased activity tolerance, Decreased mobility, Increased muscle spasms, Improper body mechanics, Impaired flexibility, Decreased range of motion  Visit Diagnosis: Neck pain     Problem List Patient Active Problem List   Diagnosis Date Noted  . Left hand pain 06/20/2018  . Forgetfulness 06/20/2018  . Myalgia 04/19/2018  . OA (osteoarthritis) of knee 01/05/2018  . Routine general medical examination at a health care facility 11/04/2015  . Essential hypertension 01/27/2015  . Hereditary and idiopathic peripheral neuropathy 01/27/2015  . Generalized anxiety disorder 01/27/2015  . MIXED HEARING LOSS UNILATERAL 04/27/2010    Lyndee Hensen, PT, DPT 2:36 PM  08/17/18    [place], [place], [address] Phone: [phone]   Fax:  [phone]  Name: Deborah Barton MRN: [id number] Date of Birth: 06-13-1946

## 2018-08-21 ENCOUNTER — Encounter: Payer: Self-pay | Admitting: Physical Therapy

## 2018-08-21 ENCOUNTER — Ambulatory Visit (INDEPENDENT_AMBULATORY_CARE_PROVIDER_SITE_OTHER): Payer: Medicare Other | Admitting: Physical Therapy

## 2018-08-21 ENCOUNTER — Encounter: Payer: Medicare Other | Admitting: Physical Therapy

## 2018-08-21 DIAGNOSIS — M542 Cervicalgia: Secondary | ICD-10-CM | POA: Diagnosis not present

## 2018-08-21 DIAGNOSIS — G5602 Carpal tunnel syndrome, left upper limb: Secondary | ICD-10-CM | POA: Diagnosis not present

## 2018-08-21 NOTE — Therapy (Signed)
[facility] [address] [place], [place], [address] Phone: [phone]   Fax:  [phone]  Physical Therapy Treatment  Patient Details  Name: Deborah Barton MRN: [id number] Date of Birth: [birth year] Referring Provider: Pricilla Holm   Encounter Date: 08/21/2018  PT End of Session - 08/21/18 1002    Visit Number  8    Number of Visits  12    Date for PT Re-Evaluation  09/04/18    Authorization Type  [employer] medicare    PT Start Time  1000    PT Stop Time  1051   moist heat   PT Time Calculation (min)  51 min    Activity Tolerance  Patient tolerated treatment well    Behavior During Therapy  [facility] for tasks assessed/performed       Past Medical History:  Diagnosis Date  . Acid reflux   . Anxiety   . Cataract   . Hypertension   . Neuropathy     Past Surgical History:  Procedure Laterality Date  . ABDOMINAL HYSTERECTOMY  1994  . CHOLECYSTECTOMY  1975    There were no vitals filed for this visit.  Subjective Assessment - 08/21/18 0957    Subjective  had some relief following DN; having some L sided neck/shoulder blade pain today.    Patient Stated Goals  Decreased pain, improved movement.     Currently in Pain?  Yes    Pain Score  5     Pain Location  Neck    Pain Orientation  Left    Pain Descriptors / Indicators  Nagging;Discomfort    Pain Type  Chronic pain    Pain Onset  More than a month ago    Pain Frequency  Intermittent                       [facility] Adult PT Treatment/Exercise - 08/21/18 1004      Exercises   Exercises  Neck      Neck Exercises: Standing   Wall Push Ups  10 reps   push up plus - 2 sets     Neck Exercises: Seated   Other Seated Exercise  Bilateral ER + scap squeeze - red tband 2 x 10    Other Seated Exercise  bilateral horiz. abd + scap squeeze - red tband 2 x 10      Neck Exercises: Prone   Rows  10 reps;Weights    Rows Weights (lbs)  2    Rows Limitations  L  side - 2 sets    Other Prone Exercise  I's/T's - L side - 1# - 2 x 10      Modalities   Modalities  Moist Heat      Moist Heat Therapy   Number Minutes Moist Heat  10 Minutes    Moist Heat Location  Cervical      Manual Therapy   Manual Therapy  Joint mobilization;Soft tissue mobilization;Passive ROM    Joint Mobilization  CPAs of C3-C7 3 x 10 sec each segment    Soft tissue mobilization  STM to L UT, L infra, L cervical paraspinals, L suboccipital    Passive ROM  L UT stretch 3 x 30 seconds               PT Short Term Goals - 08/10/18 1529      PT SHORT TERM GOAL #1   Title  Pt to be independent  with initial HEP    Time  2    Period  Weeks    Status  Achieved      PT SHORT TERM GOAL #2   Title  Pt to demo improved cervical ROM by at least 5 degrees for L rotation.     Time  2    Period  Weeks    Status  Achieved        PT Long Term Goals - 08/21/18 1002      PT LONG TERM GOAL #1   Title  Pt to demo improved cervical ROM, to be WNL , to improve ability for work duties, and driving.     Status  On-going      PT LONG TERM GOAL #2   Title  Pt to report decreased pain in cervical region, to 0-2/10 with sleeping, and activity     Status  On-going      PT LONG TERM GOAL #3   Title  Pt to be independent with final HEP     Status  On-going            Plan - 08/21/18 1003    Clinical Impression Statement  Patient reporting improvemens in L sided neck pain following last session, however, reporitng increased pain levels today. Session today focusing on periscapular strength and motor control for improved postural awareness as well as reduced stress/strain in cervical musculature. Does require verbal/tactile cueing to reduce UT compensation throughout. Making good progress towards established goals.     PT Frequency  2x / week    PT Duration  6 weeks    PT Treatment/Interventions  ADLs/Self Care Home Management;Cryotherapy;Electrical Stimulation;Iontophoresis  4mg /ml Dexamethasone;Moist Heat;Therapeutic activities;Functional mobility training;Ultrasound;Therapeutic exercise;Neuromuscular re-education;Patient/family education;Dry needling;Passive range of motion;Manual techniques;Taping    PT Next Visit Plan  update HEP    Consulted and Agree with Plan of Care  Patient       Patient will benefit from skilled therapeutic intervention in order to improve the following deficits and impairments:  Hypomobility, Pain, Impaired UE functional use, Decreased strength, Decreased activity tolerance, Decreased mobility, Increased muscle spasms, Improper body mechanics, Impaired flexibility, Decreased range of motion  Visit Diagnosis: Neck pain     Problem List Patient Active Problem List   Diagnosis Date Noted  . Left hand pain 06/20/2018  . Forgetfulness 06/20/2018  . Myalgia 04/19/2018  . OA (osteoarthritis) of knee 01/05/2018  . Routine general medical examination at a health care facility 11/04/2015  . Essential hypertension 01/27/2015  . Hereditary and idiopathic peripheral neuropathy 01/27/2015  . Generalized anxiety disorder 01/27/2015  . MIXED HEARING LOSS UNILATERAL 04/27/2010    Lanney Gins, PT, DPT 08/21/18 10:52 AM Pager: [phone]  [facility] [place], [place], [address] Phone: [phone]   Fax:  [phone]  Name: KEIRSTON SAEPHANH MRN: [id number] Date of Birth: Aug 12, 1946

## 2018-08-24 ENCOUNTER — Encounter: Payer: Medicare Other | Admitting: Physical Therapy

## 2018-08-28 ENCOUNTER — Other Ambulatory Visit: Payer: Self-pay | Admitting: Internal Medicine

## 2018-09-04 ENCOUNTER — Encounter: Payer: Self-pay | Admitting: Physical Therapy

## 2018-09-04 ENCOUNTER — Ambulatory Visit (INDEPENDENT_AMBULATORY_CARE_PROVIDER_SITE_OTHER): Payer: Medicare Other | Admitting: Physical Therapy

## 2018-09-04 DIAGNOSIS — M542 Cervicalgia: Secondary | ICD-10-CM

## 2018-09-04 NOTE — Therapy (Signed)
[facility] [place], [place], [address] Phone: [phone]   Fax:  [phone]  Physical Therapy Treatment  Patient Details  Name: Deborah Barton MRN: [id number] Date of Birth: Dec 23, 1946 Referring Provider: Pricilla Holm   Encounter Date: 09/04/2018  PT End of Session - 09/04/18 1311    Visit Number  9    Number of Visits  15    Date for PT Re-Evaluation  09/25/18    Authorization Type  [employer] medicare    PT Start Time  1607   pt late   PT Stop Time  1344    PT Time Calculation (min)  39 min    Activity Tolerance  Patient tolerated treatment well    Behavior During Therapy  [facility] for tasks assessed/performed       Past Medical History:  Diagnosis Date  . Acid reflux   . Anxiety   . Cataract   . Hypertension   . Neuropathy     Past Surgical History:  Procedure Laterality Date  . ABDOMINAL HYSTERECTOMY  1994  . CHOLECYSTECTOMY  1975    There were no vitals filed for this visit.  Subjective Assessment - 09/04/18 1306    Subjective  "All last weekend my neck was bothering me." - has good and bad days. Says she does HEP 1x/day. Feels like she is getting benefit from PT; has discomfort at night sleeping.    Limitations  Reading;House hold activities    Patient Stated Goals  Decreased pain, improved movement.     Currently in Pain?  No/denies   "believe it or not, no"   Aggravating Factors   sleeping, having fan on at night?    Pain Relieving Factors  tylenol, HEP         [facility] PT Assessment - 09/04/18 1314      Assessment   Medical Diagnosis  Neck and Back Pain    Referring Provider  Pricilla Holm      Precautions   Precautions  None      AROM   AROM Assessment Site  Cervical    Cervical Flexion  WNL    Cervical Extension  WNL    Cervical - Right Side Bend  mild limitation - discomfort    Cervical - Left Side Bend  mild limitations - muscle pull    Cervical - Right Rotation  WNL - no  pain    Cervical - Left Rotation  WNL - slight discomfort                   [facility] Adult PT Treatment/Exercise - 09/04/18 0001      Neck Exercises: Standing   Wall Push Ups  --   closed chain scap retraction/protraction 2 x 12 reps   Other Standing Exercises  Rows RTB x 20 - VC for form    Other Standing Exercises  shoulder extension + scap squeeze - RTB x 20      Neck Exercises: Seated   Other Seated Exercise  Bilateral ER + scap squeeze - red tband 2 x 15    Other Seated Exercise  bilateral horiz. abd + scap squeeze - red tband 2 x 15               PT Short Term Goals - 08/10/18 1529      PT SHORT TERM GOAL #1   Title  Pt to be independent with initial HEP    Time  2  Period  Weeks    Status  Achieved      PT SHORT TERM GOAL #2   Title  Pt to demo improved cervical ROM by at least 5 degrees for L rotation.     Time  2    Period  Weeks    Status  Achieved        PT Long Term Goals - 09/04/18 1312      PT LONG TERM GOAL #1   Title  Pt to demo improved cervical ROM, to be WNL , to improve ability for work duties, and driving.     Status  Achieved      PT LONG TERM GOAL #2   Title  Pt to report decreased pain in cervical region, to 0-2/10 with sleeping, and activity     Baseline  reports pain up to 5/10 with sleeping (09/04/17)    Status  On-going    Target Date  09/25/18      PT LONG TERM GOAL #3   Title  Pt to be independent with final HEP     Status  On-going    Target Date  09/25/18      PT LONG TERM GOAL #4   Title  patient to verbalize ways to prevent and manage pain at [place]    Target Date  09/25/18            Plan - 09/04/18 1319    Clinical Impression Statement  Patient reporting good benefit from PT thus far - does not feel ready to transition to independent HEP, as well as continued reports of night time pain/discomfort affecting her sleeping patterns. Much time spent today discussing need for good  compliance with HEP as well as expected PT outcomes - not 0/10 pain, but more-so managing and preventing discomfort as able. Good improvements in cervical AROM with only slight discomfort with end ranges of motion. Will plan to re-cert for ~3 weeks with PT hopeful to discharge at end of cert period.     Rehab Potential  Good    PT Frequency  2x / week    PT Duration  6 weeks    PT Treatment/Interventions  ADLs/Self Care Home Management;Cryotherapy;Electrical Stimulation;Iontophoresis 4mg /ml Dexamethasone;Moist Heat;Therapeutic activities;Functional mobility training;Ultrasound;Therapeutic exercise;Neuromuscular re-education;Patient/family education;Dry needling;Passive range of motion;Manual techniques;Taping    PT Next Visit Plan  progress to more active strategies for pain control    Consulted and Agree with Plan of Care  Patient       Patient will benefit from skilled therapeutic intervention in order to improve the following deficits and impairments:  Hypomobility, Pain, Impaired UE functional use, Decreased strength, Decreased activity tolerance, Decreased mobility, Increased muscle spasms, Improper body mechanics, Impaired flexibility, Decreased range of motion  Visit Diagnosis: Neck pain     Problem List Patient Active Problem List   Diagnosis Date Noted  . Left hand pain 06/20/2018  . Forgetfulness 06/20/2018  . Myalgia 04/19/2018  . OA (osteoarthritis) of knee 01/05/2018  . Routine general medical examination at a health care facility 11/04/2015  . Essential hypertension 01/27/2015  . Hereditary and idiopathic peripheral neuropathy 01/27/2015  . Generalized anxiety disorder 01/27/2015  . MIXED HEARING LOSS UNILATERAL 04/27/2010    Lanney Gins, PT, DPT 09/04/18 2:41 PM Pager: [phone]  [place], [place], [address] Phone: [phone]   Fax:  [phone]  Name: Deborah Barton MRN:  [id number] Date of  Birth: [birth year]

## 2018-09-08 DIAGNOSIS — M79642 Pain in left hand: Secondary | ICD-10-CM | POA: Diagnosis not present

## 2018-09-08 DIAGNOSIS — M1812 Unilateral primary osteoarthritis of first carpometacarpal joint, left hand: Secondary | ICD-10-CM | POA: Diagnosis not present

## 2018-09-08 DIAGNOSIS — G5602 Carpal tunnel syndrome, left upper limb: Secondary | ICD-10-CM | POA: Diagnosis not present

## 2018-09-11 ENCOUNTER — Ambulatory Visit (INDEPENDENT_AMBULATORY_CARE_PROVIDER_SITE_OTHER): Payer: Medicare Other | Admitting: Physical Therapy

## 2018-09-11 ENCOUNTER — Encounter: Payer: Self-pay | Admitting: Physical Therapy

## 2018-09-11 DIAGNOSIS — M542 Cervicalgia: Secondary | ICD-10-CM

## 2018-09-11 NOTE — Therapy (Signed)
[facility] [address] [place], [place], [address] Phone: [phone]   Fax:  [phone]  Physical Therapy Treatment  Patient Details  Name: Deborah Barton MRN: [id number] Date of Birth: [birth year] Referring Provider: Pricilla Holm   Encounter Date: 09/11/2018  PT End of Session - 09/11/18 1445    Visit Number  10    Number of Visits  15    Date for PT Re-Evaluation  09/25/18    Authorization Type  [employer] medicare    PT Start Time  9323    PT Stop Time  1518    PT Time Calculation (min)  40 min    Activity Tolerance  Patient tolerated treatment well    Behavior During Therapy  [facility] for tasks assessed/performed       Past Medical History:  Diagnosis Date  . Acid reflux   . Anxiety   . Cataract   . Hypertension   . Neuropathy     Past Surgical History:  Procedure Laterality Date  . ABDOMINAL HYSTERECTOMY  1994  . CHOLECYSTECTOMY  1975    There were no vitals filed for this visit.  Subjective Assessment - 09/11/18 2106    Subjective  Pt states that she is better, but continues to have nagging pain in neck. She has pain at night when sleeping.     Currently in Pain?  Yes    Pain Score  3     Pain Location  Neck    Pain Orientation  Left    Pain Descriptors / Indicators  Nagging;Tightness    Pain Type  Chronic pain    Pain Onset  More than a month ago    Pain Frequency  Intermittent                       [facility] Adult PT Treatment/Exercise - 09/11/18 1443      Neck Exercises: Standing   Wall Push Ups  --    Other Standing Exercises  Rows RTB x 20 - VC for form    Other Standing Exercises  shoulder extension + scap squeeze - RTB x 20      Neck Exercises: Seated   Neck Retraction  20 reps    Other Seated Exercise  --    Other Seated Exercise  bilateral horiz. abd + scap squeeze - red tband 2 x 15      Manual Therapy   Manual therapy comments  skilled palpation and monitoring of soft tissue  during DN    Soft tissue mobilization  STM to L UT, levator and rhomboid       Trigger Point Dry Needling - 09/11/18 2117    Consent Given?  Yes    Muscles Treated Upper Body  Upper trapezius;Levator scapulae    Upper Trapezius Response  Twitch reponse elicited;Palpable increased muscle length    Levator Scapulae Response  Twitch response elicited;Palpable increased muscle length           PT Education - 09/11/18 2108    Education Details  on progress, HEP, and PT plan     Person(s) Educated  Patient    Methods  Explanation    Comprehension  Verbalized understanding       PT Short Term Goals - 08/10/18 1529      PT SHORT TERM GOAL #1   Title  Pt to be independent with initial HEP    Time  2    Period  Weeks    Status  Achieved      PT SHORT TERM GOAL #2   Title  Pt to demo improved cervical ROM by at least 5 degrees for L rotation.     Time  2    Period  Weeks    Status  Achieved        PT Long Term Goals - 09/04/18 1312      PT LONG TERM GOAL #1   Title  Pt to demo improved cervical ROM, to be WNL , to improve ability for work duties, and driving.     Status  Achieved      PT LONG TERM GOAL #2   Title  Pt to report decreased pain in cervical region, to 0-2/10 with sleeping, and activity     Baseline  reports pain up to 5/10 with sleeping (09/04/17)    Status  On-going    Target Date  09/25/18      PT LONG TERM GOAL #3   Title  Pt to be independent with final HEP     Status  On-going    Target Date  09/25/18      PT LONG TERM GOAL #4   Title  patient to verbalize ways to prevent and manage pain at [place]    Target Date  09/25/18            Plan - 09/11/18 2112    Clinical Impression Statement  Pt with improved soreness in Upper trap region after dry needling today. Discussed sleeping postitions for pillows to assist with pain at night. Plan to work towards d/c to HEP as pain improves.     Rehab Potential  Good    PT Frequency  2x  / week    PT Duration  6 weeks    PT Treatment/Interventions  ADLs/Self Care Home Management;Cryotherapy;Electrical Stimulation;Iontophoresis 4mg /ml Dexamethasone;Moist Heat;Therapeutic activities;Functional mobility training;Ultrasound;Therapeutic exercise;Neuromuscular re-education;Patient/family education;Dry needling;Passive range of motion;Manual techniques;Taping    PT Next Visit Plan  progress to more active strategies for pain control    Consulted and Agree with Plan of Care  Patient       Patient will benefit from skilled therapeutic intervention in order to improve the following deficits and impairments:  Hypomobility, Pain, Impaired UE functional use, Decreased strength, Decreased activity tolerance, Decreased mobility, Increased muscle spasms, Improper body mechanics, Impaired flexibility, Decreased range of motion  Visit Diagnosis: Neck pain     Problem List Patient Active Problem List   Diagnosis Date Noted  . Left hand pain 06/20/2018  . Forgetfulness 06/20/2018  . Myalgia 04/19/2018  . OA (osteoarthritis) of knee 01/05/2018  . Routine general medical examination at a health care facility 11/04/2015  . Essential hypertension 01/27/2015  . Hereditary and idiopathic peripheral neuropathy 01/27/2015  . Generalized anxiety disorder 01/27/2015  . MIXED HEARING LOSS UNILATERAL 04/27/2010   Lyndee Hensen, PT, DPT 9:19 PM  09/11/18    [facility] [place], [place], [address] Phone: [phone]   Fax:  [phone]  Name: Deborah Barton MRN: [id number] Date of Birth: Feb 25, 1946

## 2018-09-13 ENCOUNTER — Other Ambulatory Visit: Payer: Self-pay | Admitting: Internal Medicine

## 2018-09-13 DIAGNOSIS — Z1231 Encounter for screening mammogram for malignant neoplasm of breast: Secondary | ICD-10-CM

## 2018-09-14 ENCOUNTER — Ambulatory Visit (INDEPENDENT_AMBULATORY_CARE_PROVIDER_SITE_OTHER): Payer: Medicare Other | Admitting: Physical Therapy

## 2018-09-14 ENCOUNTER — Encounter: Payer: Self-pay | Admitting: Physical Therapy

## 2018-09-14 DIAGNOSIS — M542 Cervicalgia: Secondary | ICD-10-CM

## 2018-09-14 NOTE — Therapy (Signed)
[facility] [address] [place], [place], [address] Phone: [phone]   Fax:  [phone]  Physical Therapy Treatment  Patient Details  Name: Deborah Barton MRN: [id number] Date of Birth: 06-10-1946 Referring Provider: Pricilla Holm   Encounter Date: 09/14/2018  PT End of Session - 09/14/18 1304    Visit Number  11    Number of Visits  15    Date for PT Re-Evaluation  09/25/18    Authorization Type  [employer] medicare    PT Start Time  1215    PT Stop Time  1258    PT Time Calculation (min)  43 min    Activity Tolerance  Patient tolerated treatment well    Behavior During Therapy  [facility] for tasks assessed/performed       Past Medical History:  Diagnosis Date  . Acid reflux   . Anxiety   . Cataract   . Hypertension   . Neuropathy     Past Surgical History:  Procedure Laterality Date  . ABDOMINAL HYSTERECTOMY  1994  . CHOLECYSTECTOMY  1975    There were no vitals filed for this visit.  Subjective Assessment - 09/14/18 1303    Subjective  Pt states improved pain from last session of dry needling.     Currently in Pain?  Yes    Pain Score  1     Pain Location  Neck    Pain Orientation  Left    Pain Descriptors / Indicators  Aching;Tightness;Nagging    Pain Type  Chronic pain    Pain Onset  More than a month ago    Pain Frequency  Intermittent    Aggravating Factors   Sleeping.                       [facility] Adult PT Treatment/Exercise - 09/14/18 1213      Exercises   Exercises  Neck      Neck Exercises: Standing   Other Standing Exercises  Rows RTB x 20     Other Standing Exercises  shoulder extension + scap squeeze - RTB x 20;  Overhead press 2 lb x20;        Neck Exercises: Seated   Neck Retraction  20 reps    Other Seated Exercise  bilateral horiz. abd + scap squeeze - red tband 2 x 15      Neck Exercises: Supine   Other Supine Exercise  Horizontal Abd 2 lb x20;  Shoulder flexion AROM   x15;       Manual Therapy   Manual therapy comments  --    Soft tissue mobilization  STM to bil cervical paraspinals, UT, Sub occipital release     Passive ROM  Manual stretching for UT.                PT Short Term Goals - 08/10/18 1529      PT SHORT TERM GOAL #1   Title  Pt to be independent with initial HEP    Time  2    Period  Weeks    Status  Achieved      PT SHORT TERM GOAL #2   Title  Pt to demo improved cervical ROM by at least 5 degrees for L rotation.     Time  2    Period  Weeks    Status  Achieved        PT Long Term Goals - 09/04/18  [place] #1   Title  Pt to demo improved cervical ROM, to be WNL , to improve ability for work duties, and driving.     Status  Achieved      PT LONG TERM GOAL #2   Title  Pt to report decreased pain in cervical region, to 0-2/10 with sleeping, and activity     Baseline  reports pain up to 5/10 with sleeping (09/04/17)    Status  On-going    Target Date  09/25/18      PT LONG TERM GOAL #3   Title  Pt to be independent with final HEP     Status  On-going    Target Date  09/25/18      PT LONG TERM GOAL #4   Title  patient to verbalize ways to prevent and manage pain at [place]    Target Date  09/25/18            Plan - 09/14/18 1305    Clinical Impression Statement  Pt with improved tenderness in L UT and levator region. She continues to complain of soreness with palaption of L high cervical region. She is doing well with ther ex progressions. Likely d/c next week.     Rehab Potential  Good    PT Frequency  2x / week    PT Duration  6 weeks    PT Treatment/Interventions  ADLs/Self Care Home Management;Cryotherapy;Electrical Stimulation;Iontophoresis 4mg /ml Dexamethasone;Moist Heat;Therapeutic activities;Functional mobility training;Ultrasound;Therapeutic exercise;Neuromuscular re-education;Patient/family education;Dry needling;Passive range of motion;Manual techniques;Taping     PT Next Visit Plan  progress to more active strategies for pain control    Consulted and Agree with Plan of Care  Patient       Patient will benefit from skilled therapeutic intervention in order to improve the following deficits and impairments:  Hypomobility, Pain, Impaired UE functional use, Decreased strength, Decreased activity tolerance, Decreased mobility, Increased muscle spasms, Improper body mechanics, Impaired flexibility, Decreased range of motion  Visit Diagnosis: Neck pain     Problem List Patient Active Problem List   Diagnosis Date Noted  . Left hand pain 06/20/2018  . Forgetfulness 06/20/2018  . Myalgia 04/19/2018  . OA (osteoarthritis) of knee 01/05/2018  . Routine general medical examination at a health care facility 11/04/2015  . Essential hypertension 01/27/2015  . Hereditary and idiopathic peripheral neuropathy 01/27/2015  . Generalized anxiety disorder 01/27/2015  . MIXED HEARING LOSS UNILATERAL 04/27/2010   Lyndee Hensen, PT, DPT 1:06 PM  09/14/18    [facility] [place], [place], [address] Phone: [phone]   Fax:  [phone]  Name: Deborah Barton MRN: [id number] Date of Birth: 1946/07/20

## 2018-09-18 ENCOUNTER — Encounter: Payer: Medicare Other | Admitting: Physical Therapy

## 2018-09-21 ENCOUNTER — Encounter: Payer: Self-pay | Admitting: Physical Therapy

## 2018-09-21 ENCOUNTER — Ambulatory Visit (INDEPENDENT_AMBULATORY_CARE_PROVIDER_SITE_OTHER): Payer: Medicare Other | Admitting: Physical Therapy

## 2018-09-21 DIAGNOSIS — M542 Cervicalgia: Secondary | ICD-10-CM

## 2018-09-21 NOTE — Therapy (Signed)
[facility] [address] [place], [place], [address] Phone: [phone]   Fax:  [phone]  Physical Therapy Treatment  Patient Details  Name: Deborah Barton MRN: [id number] Date of Birth: 1946/07/20 Referring Provider (PT): Pricilla Holm   Encounter Date: 09/21/2018  PT End of Session - 09/21/18 1455    Visit Number  11    Number of Visits  15    Date for PT Re-Evaluation  09/28/18    Authorization Type  [employer] medicare    PT Start Time  1220    PT Stop Time  1312    PT Time Calculation (min)  52 min    Activity Tolerance  Patient tolerated treatment well    Behavior During Therapy  [facility] for tasks assessed/performed       Past Medical History:  Diagnosis Date  . Acid reflux   . Anxiety   . Cataract   . Hypertension   . Neuropathy     Past Surgical History:  Procedure Laterality Date  . ABDOMINAL HYSTERECTOMY  1994  . CHOLECYSTECTOMY  1975    There were no vitals filed for this visit.  Subjective Assessment - 09/21/18 1454    Subjective  Pt states most pain is improved,but she continues to have pain when she is laying down to sleep, on her side, that continues to be bothersome. Pain during the day with activity is better.     Patient Stated Goals  Decreased pain,     Currently in Pain?  Yes    Pain Score  2     Pain Location  Neck    Pain Orientation  Left    Pain Descriptors / Indicators  Aching;Tightness    Pain Type  Chronic pain    Pain Onset  More than a month ago    Pain Frequency  Intermittent    Aggravating Factors   sleeping                       [facility] Adult PT Treatment/Exercise - 09/21/18 1223      Exercises   Exercises  Neck      Neck Exercises: Standing   Other Standing Exercises  Rows RTB x 20 : scap pull outs RTB x20;  UE scaption x20;     Other Standing Exercises  --      Neck Exercises: Seated   Neck Retraction  20 reps    Other Seated Exercise  Overhead press 2 lb  x20;     Other Seated Exercise  --      Neck Exercises: Supine   Other Supine Exercise  --      Moist Heat Therapy   Number Minutes Moist Heat  12 Minutes    Moist Heat Location  Cervical      Manual Therapy   Manual Therapy  Manual Traction    Manual therapy comments  skilled palpation and monitoring of soft tissue during DN    Joint Mobilization  PA mobs, side glides;     Soft tissue mobilization  STM/DTM to L UT, cervical paraspinals.     Passive ROM  Manual stretching for UT, PROM for rotation and SB;     Manual Traction  10 sec x8       Trigger Point Dry Needling - 09/21/18 1326    Consent Given?  Yes    Muscles Treated Upper Body  Upper trapezius    Upper Trapezius Response  Twitch reponse elicited;Palpable increased muscle length   Left          PT Education - 09/21/18 1455    Education Details  Sleeping position    Person(s) Educated  Patient    Methods  Explanation    Comprehension  Verbalized understanding       PT Short Term Goals - 08/10/18 1529      PT SHORT TERM GOAL #1   Title  Pt to be independent with initial HEP    Time  2    Period  Weeks    Status  Achieved      PT SHORT TERM GOAL #2   Title  Pt to demo improved cervical ROM by at least 5 degrees for L rotation.     Time  2    Period  Weeks    Status  Achieved        PT Long Term Goals - 09/21/18 1456      PT LONG TERM GOAL #1   Title  Pt to demo improved cervical ROM, to be WNL , to improve ability for work duties, and driving.     Status  Achieved      PT LONG TERM GOAL #2   Title  Pt to report decreased pain in cervical region, to 0-2/10 with sleeping, and activity     Baseline  reports pain up to 5/10 with sleeping (09/04/17)    Status  Partially Met    Target Date  09/28/18      PT LONG TERM GOAL #3   Title  Pt to be independent with final HEP     Status  Partially Met    Target Date  09/28/18      PT LONG TERM GOAL #4   Title  patient to verbalize ways to prevent and  manage pain at C-Spine    Status  Partially Met    Target Date  09/28/18            Plan - 09/21/18 1457    Clinical Impression Statement  Pt with improved pain in UT, but continues to have pain at mid cervical region with palpation and with sleeping. Discussed increasing pillow height with sleeping. Dry needling done at L UT to improve tightness, pain and ROM. Pt continues to have mild ROM limitation for R SB. Pt to be seen for 1 visit next, will d/c next visit, and will assess effects of pillow arrangement and dry needling. Discussed pt getting follow up appt with MD if she continues to have pain with sleeping. Pt likely getting symptoms from increased cervical compression in sidelying postition. Pt continues to require cueing for mechanics with ther ex, will finalize HEp next visit.     Clinical Presentation  Stable    Clinical Decision Making  Low    Rehab Potential  Good    PT Frequency  2x / week    PT Duration  2 weeks    PT Treatment/Interventions  ADLs/Self Care Home Management;Cryotherapy;Electrical Stimulation;Iontophoresis '4mg'$ /ml Dexamethasone;Moist Heat;Therapeutic activities;Functional mobility training;Ultrasound;Therapeutic exercise;Neuromuscular re-education;Patient/family education;Dry needling;Passive range of motion;Manual techniques;Taping    PT Next Visit Plan  progress to more active strategies for pain control    Consulted and Agree with Plan of Care  Patient       Patient will benefit from skilled therapeutic intervention in order to improve the following deficits and impairments:  Hypomobility, Pain, Impaired UE functional use, Decreased strength, Decreased activity tolerance, Decreased mobility, Increased muscle  spasms, Improper body mechanics, Impaired flexibility, Decreased range of motion  Visit Diagnosis: Neck pain     Problem List Patient Active Problem List   Diagnosis Date Noted  . Left hand pain 06/20/2018  . Forgetfulness 06/20/2018  . Myalgia  04/19/2018  . OA (osteoarthritis) of knee 01/05/2018  . Routine general medical examination at a health care facility 11/04/2015  . Essential hypertension 01/27/2015  . Hereditary and idiopathic peripheral neuropathy 01/27/2015  . Generalized anxiety disorder 01/27/2015  . MIXED HEARING LOSS UNILATERAL 04/27/2010    Lyndee Hensen, PT, DPT 3:03 PM  09/21/18    [facility] [place], [place], [address] Phone: [phone]   Fax:  [phone]  Name: PORT[place] SARINANA MRN: [id number] Date of Birth: July 26, 1946

## 2018-09-25 ENCOUNTER — Encounter: Payer: Medicare Other | Admitting: Physical Therapy

## 2018-09-28 ENCOUNTER — Encounter: Payer: Self-pay | Admitting: Physical Therapy

## 2018-09-28 ENCOUNTER — Ambulatory Visit (INDEPENDENT_AMBULATORY_CARE_PROVIDER_SITE_OTHER): Payer: Medicare Other | Admitting: Physical Therapy

## 2018-09-28 DIAGNOSIS — M542 Cervicalgia: Secondary | ICD-10-CM | POA: Diagnosis not present

## 2018-09-28 NOTE — Therapy (Signed)
[facility] [address] [place], [place], [address] Phone: [phone]   Fax:  [phone]  Physical Therapy Treatment  Patient Details  Name: Deborah Barton MRN: [id number] Date of Birth: Dec 15, 1946 Referring Provider (PT): Pricilla Holm   Encounter Date: 09/28/2018  PT End of Session - 09/28/18 1611    Visit Number  13    Number of Visits  15    Date for PT Re-Evaluation  09/28/18    Authorization Type  [employer] medicare    PT Start Time  2263    PT Stop Time  1513    PT Time Calculation (min)  41 min    Activity Tolerance  Patient tolerated treatment well    Behavior During Therapy  [facility] for tasks assessed/performed       Past Medical History:  Diagnosis Date  . Acid reflux   . Anxiety   . Cataract   . Hypertension   . Neuropathy     Past Surgical History:  Procedure Laterality Date  . ABDOMINAL HYSTERECTOMY  1994  . CHOLECYSTECTOMY  1975    There were no vitals filed for this visit.  Subjective Assessment - 09/28/18 1609    Subjective  Pt states she has had the least pain in last few days than she has in a few months. She states minimal pain with standing, working, daily activities, but continues to have sornesss when laying down at night. She does not have follow up with MD, but will call to schedule appt.     Patient Stated Goals  Decreased pain,     Currently in Pain?  Yes    Pain Score  2     Pain Location  Neck    Pain Orientation  Left    Pain Descriptors / Indicators  Aching    Pain Type  Chronic pain    Pain Onset  More than a month ago    Pain Frequency  Intermittent         [facility] PT Assessment - 09/28/18 0001      AROM   Cervical - Left Rotation  Mild limitation- soreness                   [facility] Adult PT Treatment/Exercise - 09/28/18 1434      Exercises   Exercises  Neck      Neck Exercises: Standing   Other Standing Exercises  Rows RTB x 20 : scap pull outs RTB x20;  UE  scaption x20;     Other Standing Exercises  shoulder extension + scap squeeze - RTB x 20;       Neck Exercises: Seated   Neck Retraction  20 reps    Other Seated Exercise  Overhead press 2 lb x20;       Neck Exercises: Supine   Other Supine Exercise  Horizontal Abd 2 lb       Neck Exercises: Prone   Other Prone Exercise  T's x15 bi;       Moist Heat Therapy   Moist Heat Location  --      Manual Therapy   Manual Therapy  Manual Traction    Manual therapy comments  --    Joint Mobilization  PA mobs, side glides;     Soft tissue mobilization  STM/DTM to L UT, cervical paraspinals.     Passive ROM  Manual stretching for UT, PROM for rotation and SB;     Manual Traction  10 sec x8             PT Education - 09/28/18 1610    Education Details  Final HEP reviewed.     Person(s) Educated  Patient    Methods  Explanation    Comprehension  Verbalized understanding       PT Short Term Goals - 08/10/18 1529      PT SHORT TERM GOAL #1   Title  Pt to be independent with initial HEP    Time  2    Period  Weeks    Status  Achieved      PT SHORT TERM GOAL #2   Title  Pt to demo improved cervical ROM by at least 5 degrees for L rotation.     Time  2    Period  Weeks    Status  Achieved        PT Long Term Goals - 09/28/18 1612      PT LONG TERM GOAL #1   Title  Pt to demo improved cervical ROM, to be WNL , to improve ability for work duties, and driving.     Status  Achieved      PT LONG TERM GOAL #2   Title  Pt to report decreased pain in cervical region, to 0-2/10 with sleeping, and activity     Baseline  reports pain up to 5/10 with sleeping (09/04/17)    Status  Partially Met      PT LONG TERM GOAL #3   Title  Pt to be independent with final HEP     Status  Achieved      PT LONG TERM GOAL #4   Title  patient to verbalize ways to prevent and manage pain at [place] - 09/28/18 1613    Clinical Impression Statement   Pt has been seen for 13 visits. She has significant reduction in pain since start of care. She has been slow to make progress in pain. She has met goals at this time for PT, and is ready for d/c to HEP. Pt in agreement with plan. Final HEP reviewed in detail today. Pt with improved ROM in c-spine, but continues to have mild restriction and soreness with L rotation. She also has mild soreness when sleeping position at night. Recommended that pt have follow up appt with referring MD for further management of pain if needed.     Rehab Potential  Good    PT Frequency  2x / week    PT Duration  2 weeks    PT Treatment/Interventions  ADLs/Self Care Home Management;Cryotherapy;Electrical Stimulation;Iontophoresis 87m/ml Dexamethasone;Moist Heat;Therapeutic activities;Functional mobility training;Ultrasound;Therapeutic exercise;Neuromuscular re-education;Patient/family education;Dry needling;Passive range of motion;Manual techniques;Taping    PT Next Visit Plan  progress to more active strategies for pain control    Consulted and Agree with Plan of Care  Patient       Patient will benefit from skilled therapeutic intervention in order to improve the following deficits and impairments:  Hypomobility, Pain, Impaired UE functional use, Decreased strength, Decreased activity tolerance, Decreased mobility, Increased muscle spasms, Improper body mechanics, Impaired flexibility, Decreased range of motion  Visit Diagnosis: Neck pain     Problem List Patient Active Problem List   Diagnosis Date Noted  . Left hand pain 06/20/2018  . Forgetfulness 06/20/2018  . Myalgia 04/19/2018  . OA (osteoarthritis) of knee 01/05/2018  . Routine  general medical examination at a health care facility 11/04/2015  . Essential hypertension 01/27/2015  . Hereditary and idiopathic peripheral neuropathy 01/27/2015  . Generalized anxiety disorder 01/27/2015  . MIXED HEARING LOSS UNILATERAL 04/27/2010    Lyndee Hensen, PT,  DPT 4:17 PM  09/28/18    [facility] [place], [place], [address] Phone: [phone]   Fax:  [phone]  Name: Deborah Barton MRN: [id number] Date of Birth: March 12, 1946    PHYSICAL THERAPY DISCHARGE SUMMARY  Visits from Start of Care:  13   Plan: Patient agrees to discharge.  Patient goals were met. Patient is being discharged due to meeting the stated rehab goals.  ?????      Lyndee Hensen, PT, DPT 4:17 PM  09/28/18

## 2018-10-02 ENCOUNTER — Encounter: Payer: Medicare Other | Admitting: Physical Therapy

## 2018-10-13 ENCOUNTER — Ambulatory Visit: Payer: Medicare Other | Admitting: Internal Medicine

## 2018-10-13 ENCOUNTER — Ambulatory Visit: Payer: Medicare Other

## 2018-10-16 ENCOUNTER — Ambulatory Visit (INDEPENDENT_AMBULATORY_CARE_PROVIDER_SITE_OTHER)
Admission: RE | Admit: 2018-10-16 | Discharge: 2018-10-16 | Disposition: A | Discharge status: Home / Self Care | Payer: Medicare Other | Source: Ambulatory Visit | Attending: Internal Medicine | Admitting: Internal Medicine

## 2018-10-16 ENCOUNTER — Ambulatory Visit (INDEPENDENT_AMBULATORY_CARE_PROVIDER_SITE_OTHER): Payer: Medicare Other | Admitting: Internal Medicine

## 2018-10-16 ENCOUNTER — Encounter: Payer: Self-pay | Admitting: Internal Medicine

## 2018-10-16 VITALS — BP 118/80 | HR 74 | Temp 97.7°F | Ht 62.0 in | Wt 134.0 lb

## 2018-10-16 DIAGNOSIS — S199XXA Unspecified injury of neck, initial encounter: Secondary | ICD-10-CM | POA: Diagnosis not present

## 2018-10-16 DIAGNOSIS — M542 Cervicalgia: Secondary | ICD-10-CM
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
## 2018-10-16 NOTE — Patient Instructions (Signed)
We will check the x-ray of the neck today.   Try taking zyrtec daily to see if this helps with itching.
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
## 2018-11-20 ENCOUNTER — Ambulatory Visit
Admission: RE | Admit: 2018-11-20 | Discharge: 2018-11-20 | Disposition: A | Discharge status: Home / Self Care | Payer: Medicare Other | Source: Ambulatory Visit | Attending: Internal Medicine | Admitting: Internal Medicine

## 2018-11-20 DIAGNOSIS — Z1231 Encounter for screening mammogram for malignant neoplasm of breast: Secondary | ICD-10-CM | POA: Diagnosis not present

## 2018-11-30 ENCOUNTER — Other Ambulatory Visit (INDEPENDENT_AMBULATORY_CARE_PROVIDER_SITE_OTHER): Payer: Medicare Other

## 2018-11-30 ENCOUNTER — Ambulatory Visit (INDEPENDENT_AMBULATORY_CARE_PROVIDER_SITE_OTHER): Payer: Medicare Other | Admitting: Internal Medicine

## 2018-11-30 ENCOUNTER — Encounter: Payer: Self-pay | Admitting: Internal Medicine

## 2018-11-30 VITALS — BP 122/78 | HR 78 | Temp 98.3°F | Ht 62.0 in | Wt 131.0 lb

## 2018-11-30 DIAGNOSIS — F411 Generalized anxiety disorder: Secondary | ICD-10-CM | POA: Diagnosis not present

## 2018-11-30 DIAGNOSIS — Z23 Encounter for immunization: Secondary | ICD-10-CM | POA: Diagnosis not present

## 2018-11-30 DIAGNOSIS — Z0000 Encounter for general adult medical examination without abnormal findings: Secondary | ICD-10-CM | POA: Diagnosis not present

## 2018-11-30 DIAGNOSIS — I10 Essential (primary) hypertension: Secondary | ICD-10-CM

## 2018-11-30 DIAGNOSIS — G609 Hereditary and idiopathic neuropathy, unspecified: Secondary | ICD-10-CM

## 2018-11-30 DIAGNOSIS — M542 Cervicalgia: Secondary | ICD-10-CM | POA: Diagnosis not present

## 2018-11-30 LAB — COMPREHENSIVE METABOLIC PANEL
ALT: 13 U/L (ref 0–35)
AST: 18 U/L (ref 0–37)
Albumin: 4.4 g/dL (ref 3.5–5.2)
Alkaline Phosphatase: 82 U/L (ref 39–117)
BUN: 14 mg/dL (ref 6–23)
CO2: 29 mEq/L (ref 19–32)
Calcium: 9.8 mg/dL (ref 8.4–10.5)
Chloride: 104 mEq/L (ref 96–112)
Creatinine, Ser: 0.85 mg/dL (ref 0.40–1.20)
GFR: 84.79 mL/min (ref >60.00)
Glucose, Bld: 97 mg/dL (ref 70–99)
Potassium: 4.1 mEq/L (ref 3.5–5.1)
Sodium: 139 mEq/L (ref 135–145)
Total Bilirubin: 0.6 mg/dL (ref 0.2–1.2)
Total Protein: 7.5 g/dL (ref 6.0–8.3)

## 2018-11-30 LAB — CBC
HCT: 35.8 % — ABNORMAL LOW (ref 36.0–46.0)
Hemoglobin: 11.6 g/dL — ABNORMAL LOW (ref 12.0–15.0)
MCHC: 32.4 g/dL (ref 30.0–36.0)
MCV: 72 fl — ABNORMAL LOW (ref 78.0–100.0)
Platelets: 276 10*3/uL (ref 150.0–400.0)
RBC: 4.97 Mil/uL (ref 3.87–5.11)
RDW: 16 % — ABNORMAL HIGH (ref 11.5–15.5)
WBC: 5.9 10*3/uL (ref 4.0–10.5)

## 2018-11-30 LAB — URINALYSIS, ROUTINE W REFLEX MICROSCOPIC
Bilirubin Urine: NEGATIVE
Hgb urine dipstick: NEGATIVE
Ketones, ur: NEGATIVE
Leukocytes, UA: NEGATIVE
Nitrite: NEGATIVE
RBC / HPF: NONE SEEN (ref 0–?)
Specific Gravity, Urine: 1.02 (ref 1.000–1.030)
Total Protein, Urine: NEGATIVE
Urine Glucose: NEGATIVE
Urobilinogen, UA: 0.2 (ref 0.0–1.0)
pH: 5.5 (ref 5.0–8.0)

## 2018-11-30 LAB — LIPID PANEL
Cholesterol: 203 mg/dL — ABNORMAL HIGH (ref 0–200)
HDL: 68.2 mg/dL (ref >39.00)
LDL Cholesterol: 117 mg/dL — ABNORMAL HIGH (ref 0–99)
NonHDL: 134.5
Total CHOL/HDL Ratio: 3
Triglycerides: 86 mg/dL (ref 0.0–149.0)
VLDL: 17.2 mg/dL (ref 0.0–40.0)

## 2018-11-30 MED ORDER — LORAZEPAM 1 MG PO TABS: 1.0000 mg | ORAL_TABLET | Freq: Every evening | ORAL | 2 refills | Status: DC | PRN

## 2018-11-30 MED ORDER — DULOXETINE HCL 60 MG PO CPEP: 60.0000 mg | ORAL_CAPSULE | Freq: Every day | ORAL | 3 refills | Status: DC

## 2018-11-30 MED ORDER — CYCLOBENZAPRINE HCL 5 MG PO TABS: 5.0000 mg | ORAL_TABLET | Freq: Three times a day (TID) | ORAL | 5 refills | Status: DC | PRN

## 2018-11-30 NOTE — Addendum Note (Signed)
Addended by: Pricilla Holm A on: 11/30/2018 10:35 AM   Modules accepted: Orders

## 2018-11-30 NOTE — Assessment & Plan Note (Signed)
Taking cymbalta and rare lorazepam. Reminded about risk of lorazepam usage including increased fall risk and memory change. She wishes to continue.

## 2018-11-30 NOTE — Patient Instructions (Signed)

## 2018-11-30 NOTE — Assessment & Plan Note (Signed)
Taking cymbalta 60 mg daily and pain in her scalp is still controlled. Continue.

## 2018-11-30 NOTE — Assessment & Plan Note (Signed)
Refill flexeril to use as needed for pain.

## 2018-11-30 NOTE — Assessment & Plan Note (Signed)
Flu shot given. Pneumonia complete. Shingrix counseled. Tetanus up to date. Colonoscopy due this year. Mammogram up to date, pap smear aged out and dexa up to date. Counseled about sun safety and mole surveillance. Counseled about the dangers of distracted driving. Given 10 year screening recommendations.

## 2018-11-30 NOTE — Assessment & Plan Note (Signed)
Checking CMP, BP at goal off medication currently. Adjust as needed.

## 2018-11-30 NOTE — Progress Notes (Signed)
   Subjective:    Patient ID: Deborah Barton, female    DOB: 01/12/1946, 72 y.o.   MRN: [id number]  HPI Here for medicare wellness and physical, no new complaints. Please see A/P for status and treatment of chronic medical problems.   Diet: heart healthy Physical activity: sedentary Depression/mood screen: negative Hearing: intact to whispered voice Visual acuity: cataracts moderate, performs annual eye exam  ADLs: capable Fall risk: none Home safety: good Cognitive evaluation: intact to orientation, naming, recall and repetition EOL planning: adv directives discussed  I have personally reviewed and have noted 1. The patient's medical and social history - reviewed today no changes 2. Their use of alcohol, tobacco or illicit drugs 3. Their current medications and supplements 4. The patient's functional ability including ADL's, fall risks, home safety risks and hearing or visual impairment. 5. Diet and physical activities 6. Evidence for depression or mood disorders 7. Care team reviewed and updated (available in snapshot)  Review of Systems  Constitutional: Negative.   HENT: Negative.   Eyes: Negative.   Respiratory: Negative for cough, chest tightness and shortness of breath.   Cardiovascular: Negative for chest pain, palpitations and leg swelling.  Gastrointestinal: Negative for abdominal distention, abdominal pain, constipation, diarrhea, nausea and vomiting.  Musculoskeletal: Positive for back pain. Negative for arthralgias, myalgias, neck pain and neck stiffness.  Skin: Negative.   Neurological: Negative.   Psychiatric/Behavioral: Negative.       Objective:   Physical Exam  Constitutional: She is oriented to person, place, and time. She appears well-developed and well-nourished.  Appears younger than stated age  HENT:  Head: Normocephalic and atraumatic.  Eyes: EOM are normal.  Neck: Normal range of motion.  Cardiovascular: Normal rate and regular rhythm.    Pulmonary/Chest: Effort normal and breath sounds normal. No respiratory distress. She has no wheezes. She has no rales.  Abdominal: Soft. Bowel sounds are normal. She exhibits no distension. There is no tenderness. There is no rebound.  Musculoskeletal: She exhibits no edema.  Neurological: She is alert and oriented to person, place, and time. Coordination normal.  Skin: Skin is warm and dry.  Psychiatric: She has a normal mood and affect.   Vitals:   11/30/18 0943 11/30/18 1016  BP: (!) 150/90 122/78  Pulse: 78   Temp: 98.3 F (36.8 C)   TempSrc: Oral   SpO2: 95%   Weight: 131 lb (59.4 kg)   Height: 5\' 2"  (1.575 m)       Assessment & Plan:  Flu shot given at visit

## 2018-12-01 ENCOUNTER — Telehealth: Payer: Self-pay | Admitting: Internal Medicine

## 2018-12-01 NOTE — Telephone Encounter (Signed)
Copied from [facility] [id number]. Topic: Quick Communication - See Telephone Encounter >> Dec 01, 2018  4:07 PM Blase Mess A wrote: CRM for notification. See Telephone encounter for: 12/01/18.  Patient is calling for lab results please advise. Thank you

## 2018-12-01 NOTE — Telephone Encounter (Signed)
Left another voicemail for patient regarding her normal lab results

## 2018-12-25 DIAGNOSIS — H40013 Open angle with borderline findings, low risk, bilateral: Secondary | ICD-10-CM | POA: Diagnosis not present

## 2018-12-25 DIAGNOSIS — H04123 Dry eye syndrome of bilateral lacrimal glands: Secondary | ICD-10-CM | POA: Diagnosis not present

## 2018-12-25 DIAGNOSIS — H16223 Keratoconjunctivitis sicca, not specified as Sjogren's, bilateral: Secondary | ICD-10-CM | POA: Diagnosis not present

## 2019-02-15 ENCOUNTER — Encounter: Payer: Self-pay | Admitting: Gastroenterology

## 2019-03-07 ENCOUNTER — Encounter: Payer: Self-pay | Admitting: Gastroenterology

## 2019-03-07 ENCOUNTER — Other Ambulatory Visit: Payer: Self-pay

## 2019-03-07 ENCOUNTER — Ambulatory Visit (INDEPENDENT_AMBULATORY_CARE_PROVIDER_SITE_OTHER): Payer: Medicare Other | Admitting: Internal Medicine

## 2019-03-07 ENCOUNTER — Encounter: Payer: Self-pay | Admitting: Internal Medicine

## 2019-03-07 DIAGNOSIS — J069 Acute upper respiratory infection, unspecified: Secondary | ICD-10-CM | POA: Diagnosis not present

## 2019-03-07 NOTE — Progress Notes (Signed)
   Subjective:   Patient ID: Deborah Barton, female    DOB: [birth year], 73 y.o.   MRN: [id number]  HPI The patient is a 73 y.o. female coming in for cold symptoms. Started 3-4 days ago. Main symptoms are: cough and scratchy throat. Denies SOB, fevers, chills. Overall it is improving dramatically but needs work clearance to return given concerns about coronavirus. Has tried otc mucinex and alka seltzer plus which helped some.   Review of Systems  Constitutional: Positive for activity change and appetite change. Negative for chills, fatigue, fever and unexpected weight change.  HENT: Positive for congestion, postnasal drip and rhinorrhea. Negative for ear discharge, ear pain, sinus pressure, sinus pain, sneezing, sore throat, tinnitus, trouble swallowing and voice change.   Eyes: Negative.   Respiratory: Positive for cough. Negative for chest tightness, shortness of breath and wheezing.   Cardiovascular: Negative.   Gastrointestinal: Negative.   Musculoskeletal: Negative.   Neurological: Negative.     Objective:  Physical Exam Constitutional:      Appearance: She is well-developed.  HENT:     Head: Normocephalic and atraumatic.     Comments: Oropharynx with redness and clear drainage, nose with swollen turbinates, TMs normal bilaterally.  Neck:     Musculoskeletal: Normal range of motion.     Thyroid: No thyromegaly.  Cardiovascular:     Rate and Rhythm: Normal rate and regular rhythm.  Pulmonary:     Effort: Pulmonary effort is normal. No respiratory distress.     Breath sounds: Normal breath sounds. No wheezing or rales.  Abdominal:     Palpations: Abdomen is soft.  Lymphadenopathy:     Cervical: No cervical adenopathy.  Skin:    General: Skin is warm and dry.  Neurological:     Mental Status: She is alert and oriented to person, place, and time.     Vitals:   03/07/19 1045  BP: 100/70  Pulse: 83  Temp: 97.8 F (36.6 C)  TempSrc: Oral  SpO2: 98%  Weight: 130 lb  (59 kg)  Height: 5\' 2"  (1.575 m)    Assessment & Plan:

## 2019-03-07 NOTE — Patient Instructions (Signed)
You can start taking zyrtec (cetirizine) daily to help with the drainage.

## 2019-03-08 DIAGNOSIS — J069 Acute upper respiratory infection, unspecified: Secondary | ICD-10-CM | POA: Insufficient documentation

## 2019-03-08 NOTE — Assessment & Plan Note (Signed)
Overall improving, does not sound suspicious for coronavirus and no travel risk. She does not require medication but needs work note which is given today.

## 2019-03-14 ENCOUNTER — Telehealth: Payer: Self-pay | Admitting: *Deleted

## 2019-03-14 NOTE — Telephone Encounter (Signed)
1643 pm- called pt for the 3rd time today- I did LM that I moved her PV to 130 pm tomorrow  and we may need to RS her colon date past 03/26/2019 per the COVID rules- I asked pt to call us back at [phone] in the morning to answer the  COVID screen since I have not been able to reach her today  And we may can RS her colon if she calls .  Lelan Pons PV

## 2019-03-14 NOTE — Telephone Encounter (Signed)
LM to [facility] for questions. [name]  Also ned to RS PV time and Colon date.

## 2019-03-22 ENCOUNTER — Encounter: Payer: Medicare Other | Admitting: Gastroenterology

## 2019-05-17 DIAGNOSIS — H35033 Hypertensive retinopathy, bilateral: Secondary | ICD-10-CM | POA: Diagnosis not present

## 2019-05-17 DIAGNOSIS — H2511 Age-related nuclear cataract, right eye: Secondary | ICD-10-CM | POA: Diagnosis not present

## 2019-05-17 DIAGNOSIS — H25013 Cortical age-related cataract, bilateral: Secondary | ICD-10-CM | POA: Diagnosis not present

## 2019-05-17 DIAGNOSIS — H2513 Age-related nuclear cataract, bilateral: Secondary | ICD-10-CM | POA: Diagnosis not present

## 2019-05-17 DIAGNOSIS — H40023 Open angle with borderline findings, high risk, bilateral: Secondary | ICD-10-CM | POA: Diagnosis not present

## 2019-05-29 DIAGNOSIS — H2511 Age-related nuclear cataract, right eye: Secondary | ICD-10-CM | POA: Diagnosis not present

## 2019-05-29 DIAGNOSIS — H25811 Combined forms of age-related cataract, right eye: Secondary | ICD-10-CM | POA: Diagnosis not present

## 2019-07-02 DIAGNOSIS — H25012 Cortical age-related cataract, left eye: Secondary | ICD-10-CM | POA: Diagnosis not present

## 2019-07-02 DIAGNOSIS — H2512 Age-related nuclear cataract, left eye: Secondary | ICD-10-CM | POA: Diagnosis not present

## 2019-07-02 DIAGNOSIS — H25042 Posterior subcapsular polar age-related cataract, left eye: Secondary | ICD-10-CM | POA: Diagnosis not present

## 2019-07-09 IMAGING — DX DG KNEE COMPLETE 4+V*R*
4 series · 4 of 4 positions shown · non-contrast
Comparison: None in PACs

CLINICAL DATA: Generalized right knee pain since a fall 3 months
ago.

EXAM:
RIGHT KNEE - COMPLETE 4+ VIEW

[knee ap]
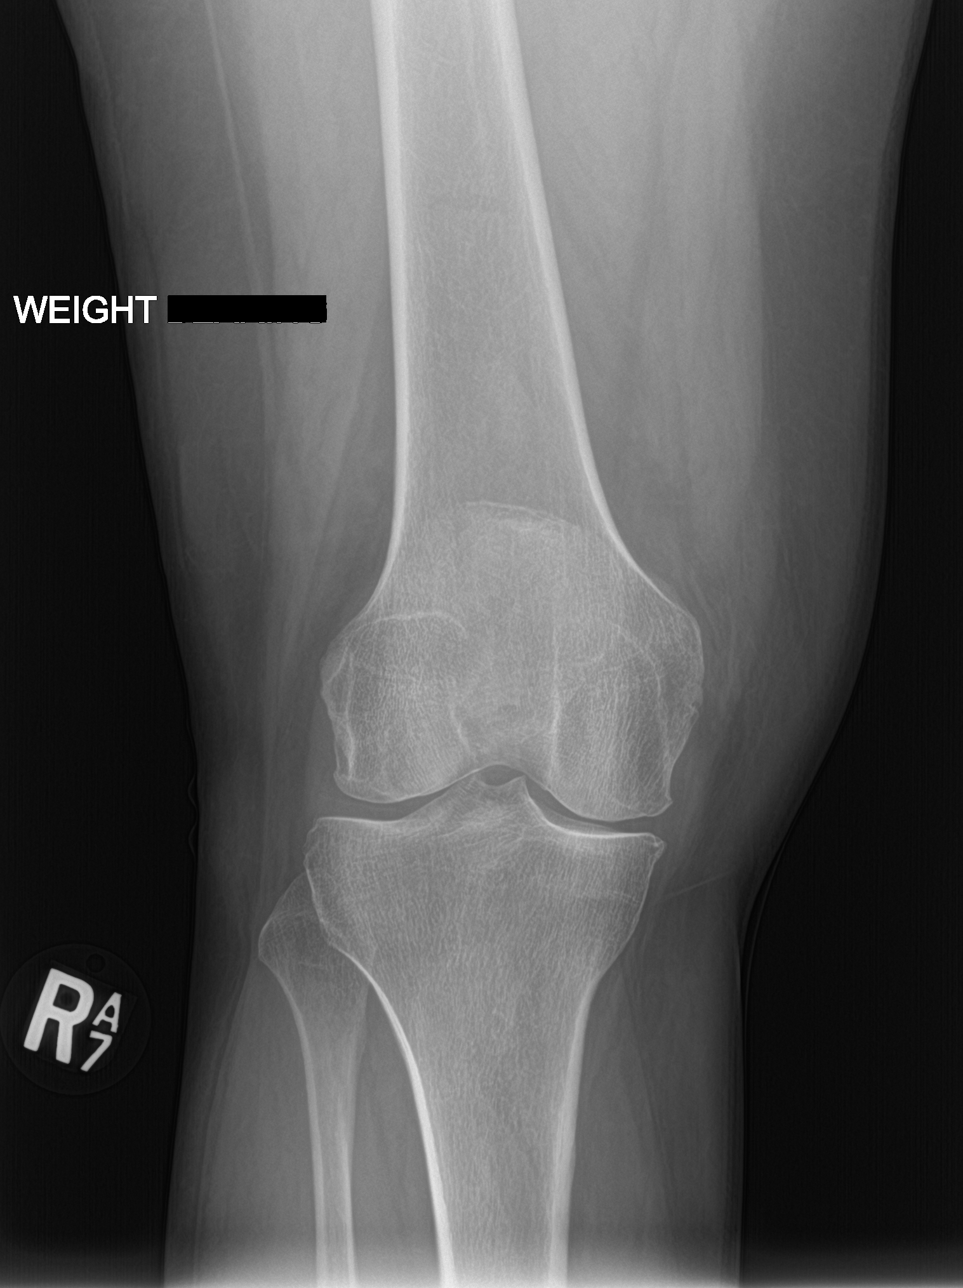

[knee tunnel]
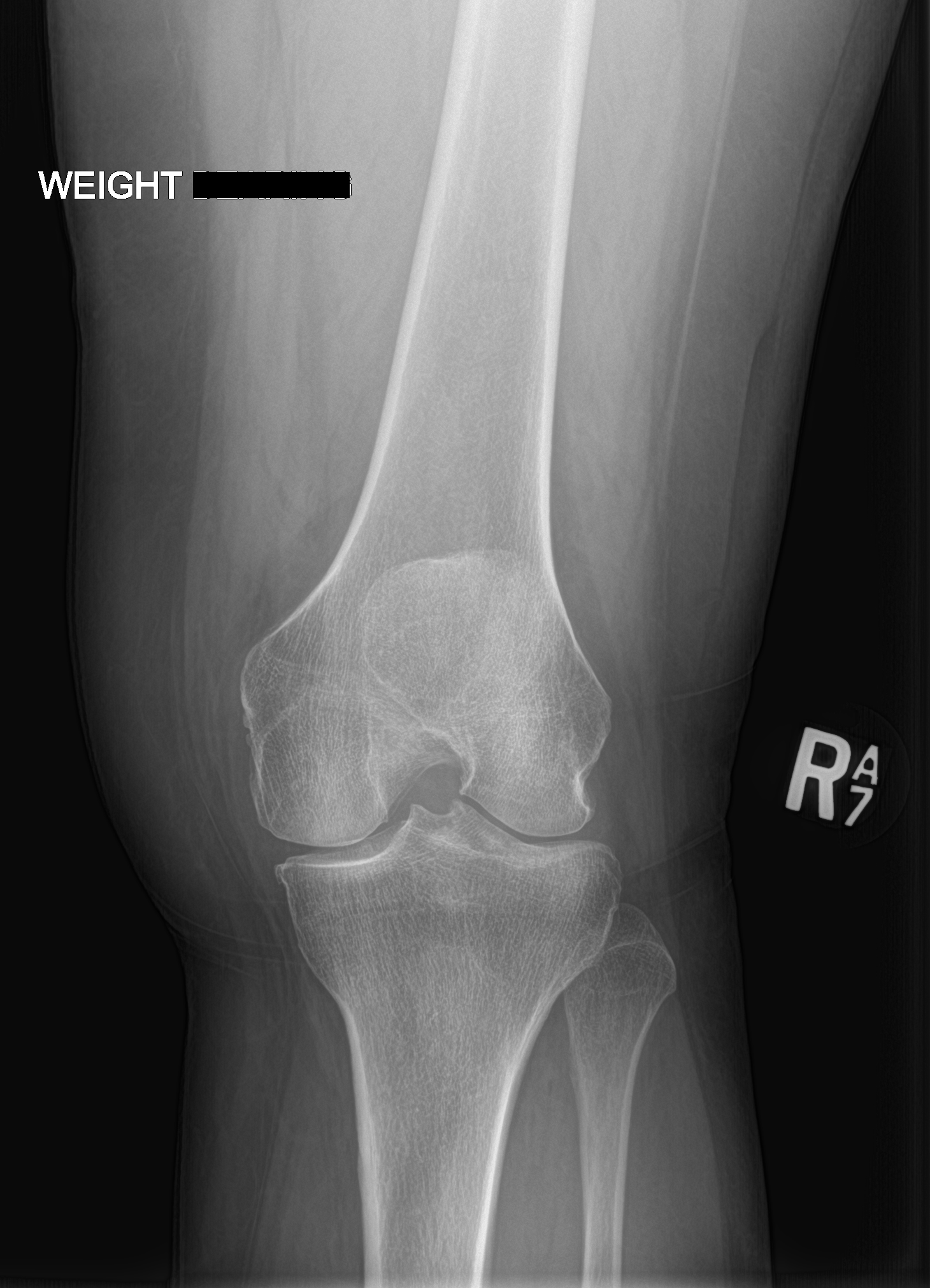

[knee lat]
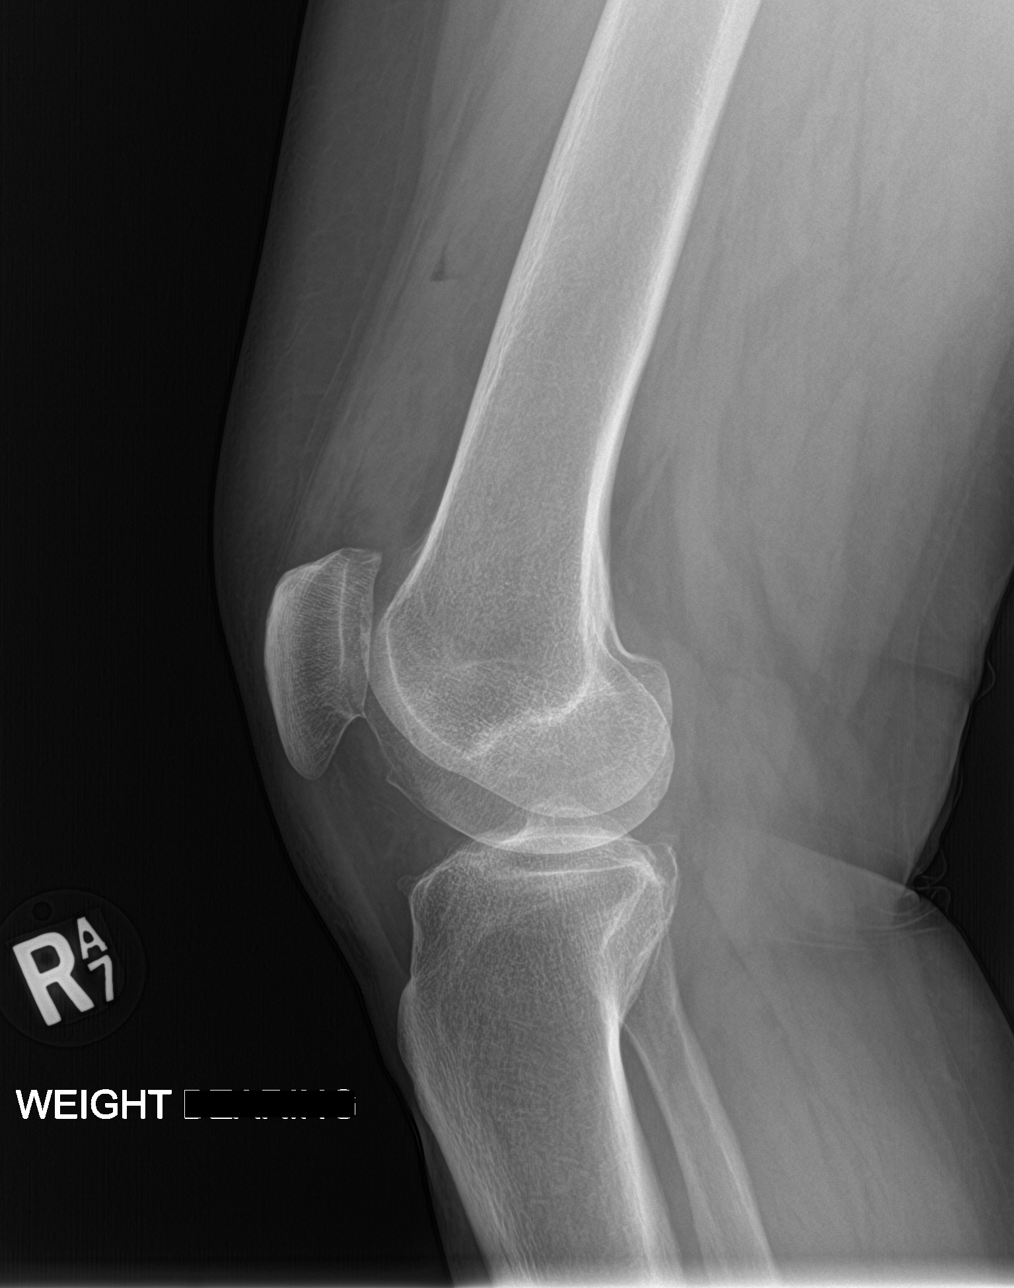

[sunrise]
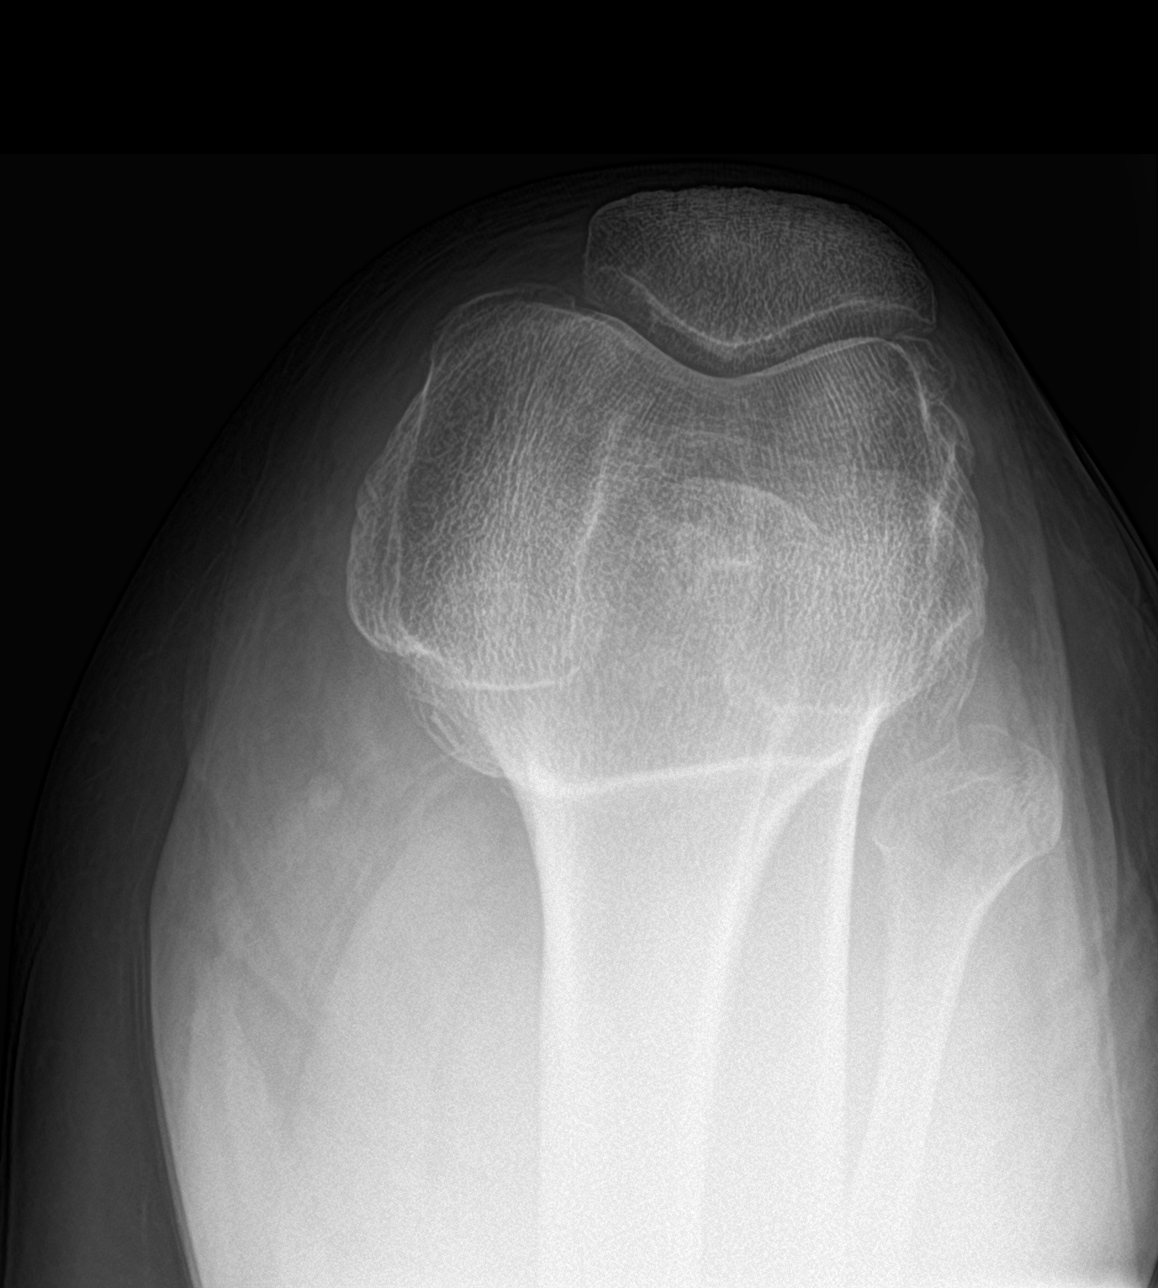

[4 of 4 positions shown; findings below may reference images not displayed]

FINDINGS: The bones are subjectively adequately mineralized. There is minimal
narrowing of the medial joint space. There beaking of the tibial
spines. A small spur arises from the inferior articular margin of
the patella. There is no acute fracture or joint effusion.
IMPRESSION: Mild osteoarthritic change centered on the medial compartment. No
acute bony abnormality is observed.

## 2019-07-10 DIAGNOSIS — H25012 Cortical age-related cataract, left eye: Secondary | ICD-10-CM | POA: Diagnosis not present

## 2019-07-10 DIAGNOSIS — H2512 Age-related nuclear cataract, left eye: Secondary | ICD-10-CM | POA: Diagnosis not present

## 2019-07-10 DIAGNOSIS — H25042 Posterior subcapsular polar age-related cataract, left eye: Secondary | ICD-10-CM | POA: Diagnosis not present

## 2019-07-10 DIAGNOSIS — H25812 Combined forms of age-related cataract, left eye: Secondary | ICD-10-CM | POA: Diagnosis not present

## 2019-08-02 ENCOUNTER — Other Ambulatory Visit: Payer: Self-pay

## 2019-08-02 DIAGNOSIS — Z20822 Contact with and (suspected) exposure to covid-19: Secondary | ICD-10-CM

## 2019-08-03 LAB — SPECIMEN STATUS REPORT

## 2019-08-03 LAB — NOVEL CORONAVIRUS, NAA: SARS-CoV-2, NAA: NOT DETECTED

## 2019-08-06 ENCOUNTER — Telehealth: Payer: Self-pay | Admitting: Internal Medicine

## 2019-08-06 NOTE — Telephone Encounter (Signed)
Informed pt of negative covid result   °

## 2019-08-10 IMAGING — DX DG CHEST 2V
2 series · 2 of 2 positions shown · non-contrast
Comparison: Chest x-ray 01/28/2018.

CLINICAL DATA: 71-year-old female with history of pneumonia.
Left-sided chest pain.

EXAM:
CHEST  2 VIEW

[chest pa]
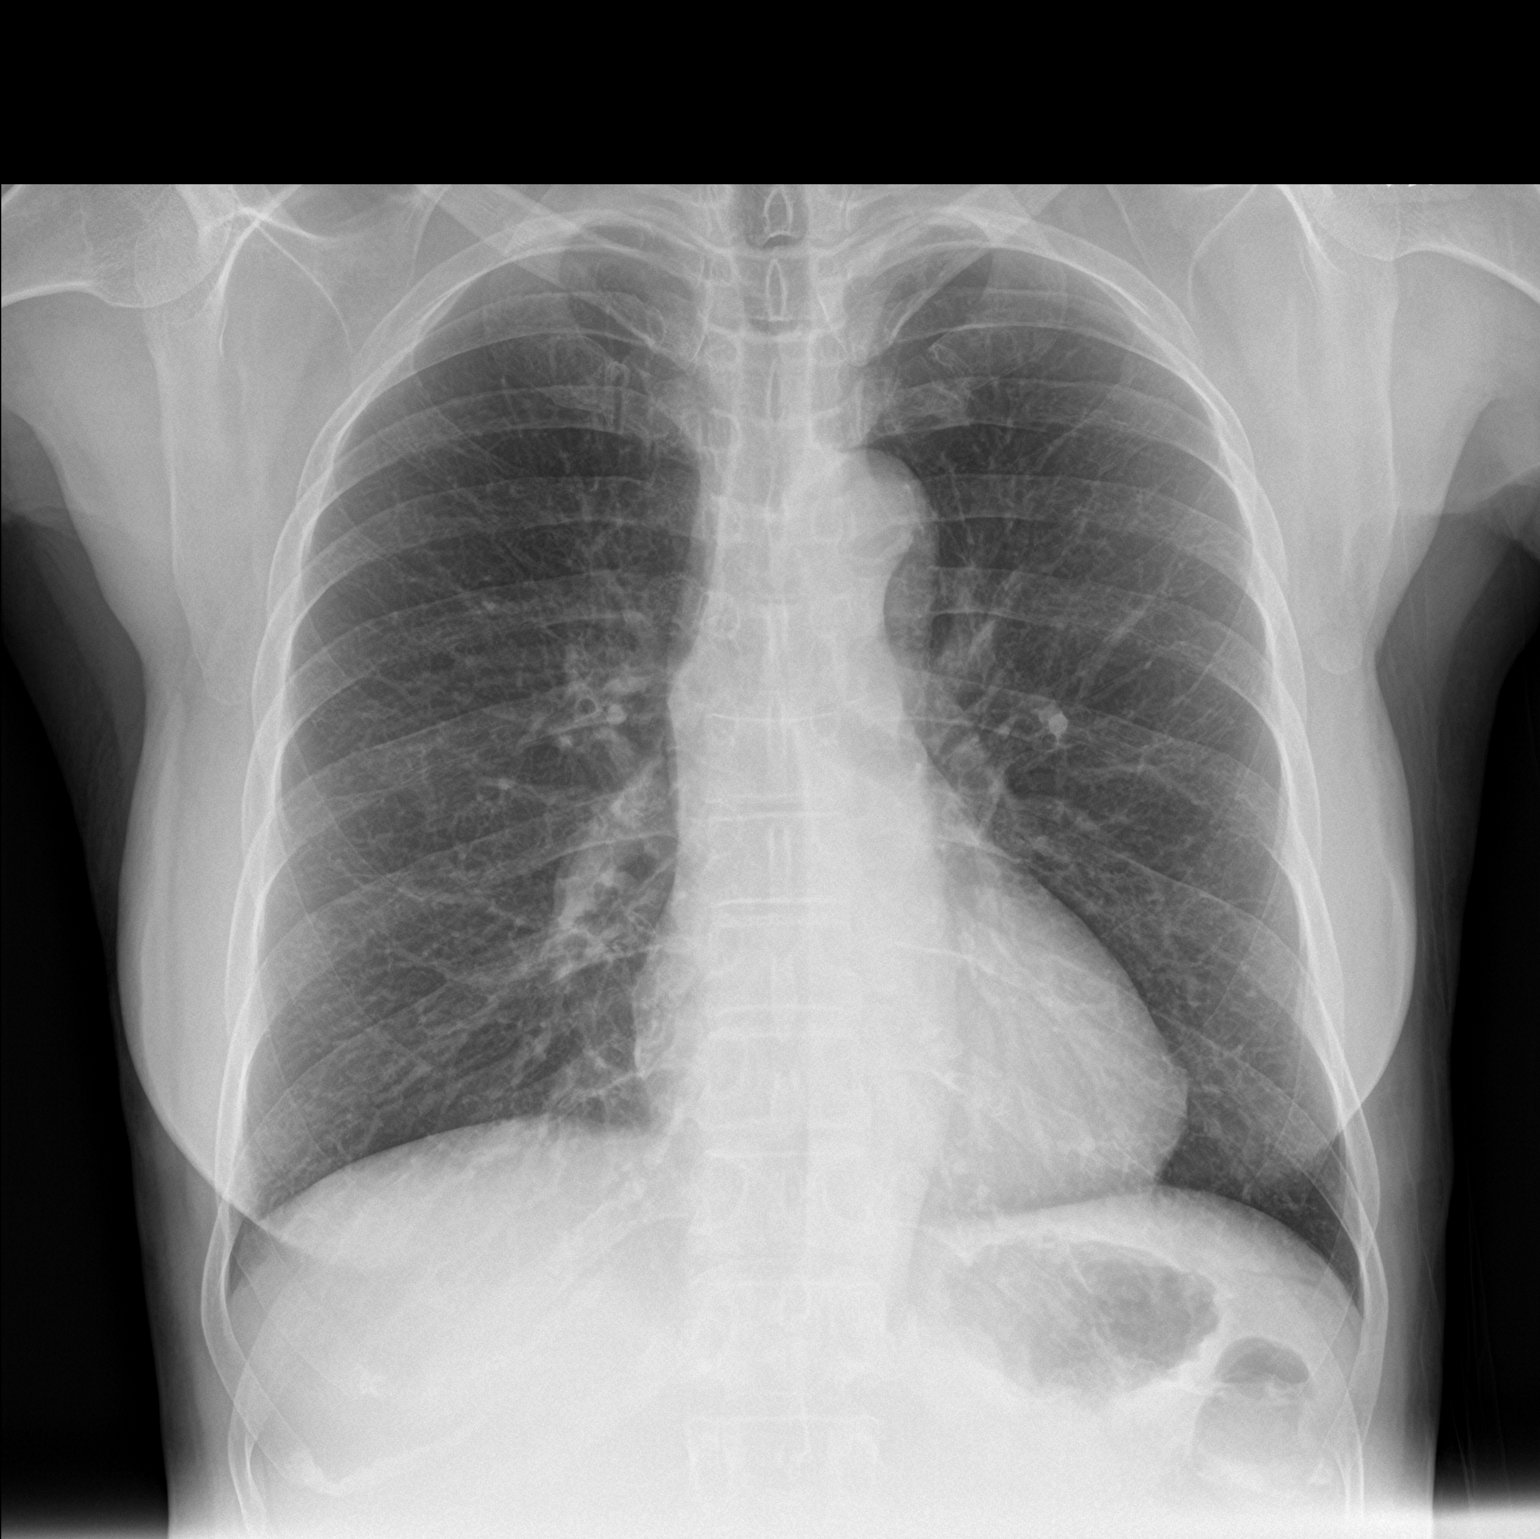

[chest lat]
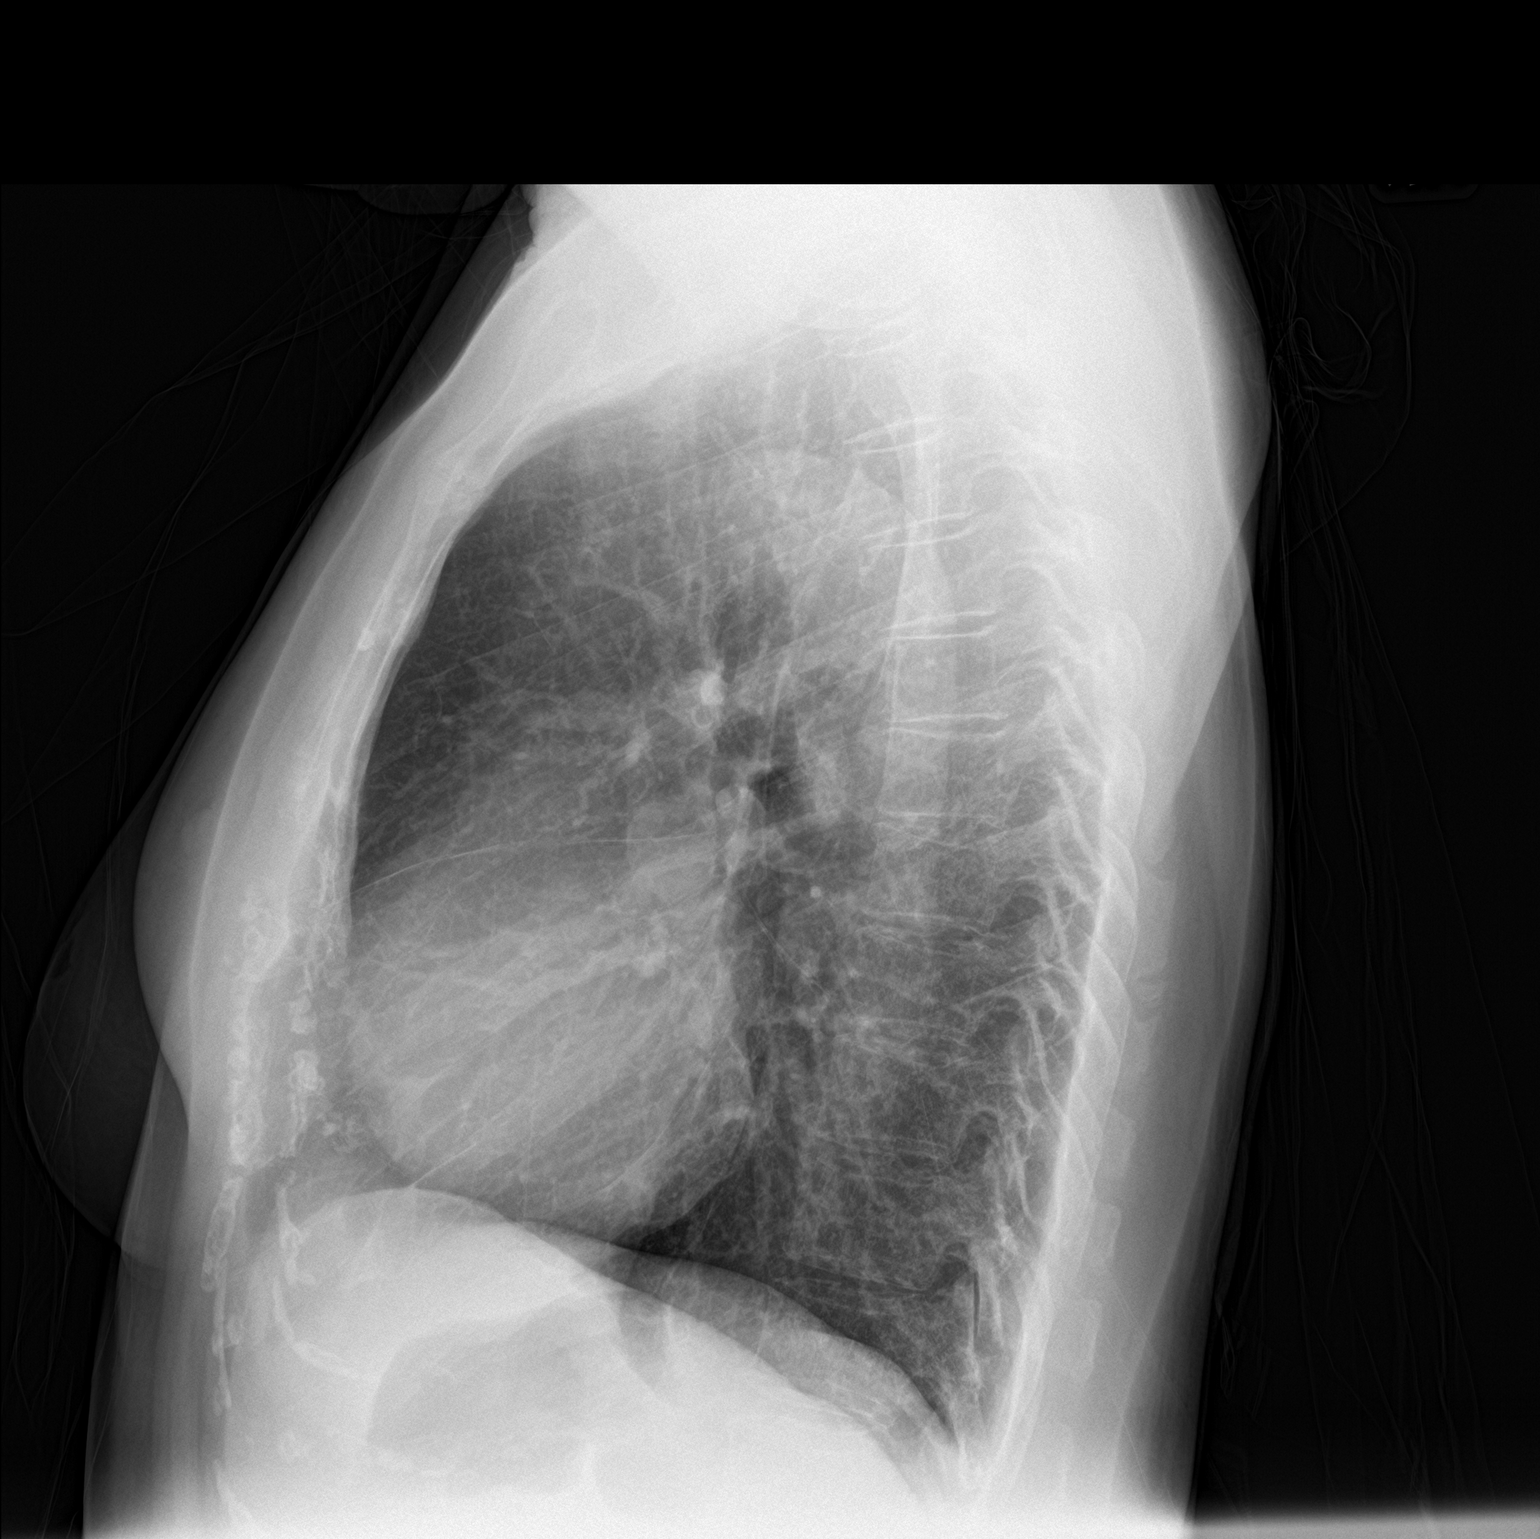

[2 of 2 positions shown; findings below may reference images not displayed]

FINDINGS: Previously noted airspace consolidation in the periphery of the left
lung base has resolved. Likewise, left pleural effusion seen on the
prior study has resolved. No new acute consolidative airspace
disease. No right pleural effusion no evidence of pulmonary edema.
Heart size is normal. Upper mediastinal contours are within normal
limits. Aortic atherosclerosis.
IMPRESSION: 1. Previously noted left lower lobe pneumonia and trace left pleural
effusion both resolved. No radiographic evidence of acute
cardiopulmonary disease noted on today's examination.
2. Aortic atherosclerosis.

## 2019-08-24 ENCOUNTER — Other Ambulatory Visit: Payer: Self-pay | Admitting: Internal Medicine

## 2019-11-12 ENCOUNTER — Other Ambulatory Visit: Payer: Self-pay

## 2019-11-12 ENCOUNTER — Encounter: Payer: Self-pay | Admitting: Obstetrics & Gynecology

## 2019-11-12 ENCOUNTER — Ambulatory Visit (INDEPENDENT_AMBULATORY_CARE_PROVIDER_SITE_OTHER): Payer: Medicare Other | Admitting: Obstetrics & Gynecology

## 2019-11-12 VITALS — BP 124/70 | Ht 61.0 in | Wt 132.0 lb

## 2019-11-12 DIAGNOSIS — Z780 Asymptomatic menopausal state: Secondary | ICD-10-CM

## 2019-11-12 DIAGNOSIS — M8589 Other specified disorders of bone density and structure, multiple sites: Secondary | ICD-10-CM

## 2019-11-12 DIAGNOSIS — R87610 Atypical squamous cells of undetermined significance on cytologic smear of cervix (ASC-US): Secondary | ICD-10-CM | POA: Diagnosis not present

## 2019-11-12 DIAGNOSIS — Z01419 Encounter for gynecological examination (general) (routine) without abnormal findings: Secondary | ICD-10-CM | POA: Diagnosis not present

## 2019-11-12 DIAGNOSIS — Z90710 Acquired absence of both cervix and uterus: Secondary | ICD-10-CM | POA: Diagnosis not present

## 2019-11-12 DIAGNOSIS — Z1272 Encounter for screening for malignant neoplasm of vagina: Secondary | ICD-10-CM

## 2019-11-12 NOTE — Progress Notes (Signed)
Deborah Barton Aug 16, 1946 [id number]   History:    73 y.o. G2P2L2 Engaged x 9 yrs.  Sons in their 30-40's.  RP:  Established patient presenting for annual gyn exam   HPI: S/P TAH in 1994.  Postmenopause, well on no HRT.  No PMB.  No pelvic pain.  No pain with IC.  Using Replens.   Urine/BMs normal.  Breasts normal.  BMI 24.94.  Walks daily and lifts weights 5-10 Lbs.  Health labs with Fam MD.  Past medical history,surgical history, family history and social history were all reviewed and documented in the EPIC chart.  Gynecologic History No LMP recorded. Patient has had a hysterectomy. Contraception: status post hysterectomy Last Pap: 08/2017. Results were: Negative Last mammogram: 10/2018. Results were: Negative Bone Density: 10/2016 Osteopenia, will schedule BD with Fam MD Colonoscopy: 2016, will schedule now  Obstetric History OB History  Gravida Para Term Preterm AB Living  2 2       2   SAB TAB Ectopic Multiple Live Births               # Outcome Date GA Lbr Len/2nd Weight Sex Delivery Anes PTL Lv  2 Para           1 Para              ROS: A ROS was performed and pertinent positives and negatives are included in the history.  GENERAL: No fevers or chills. HEENT: No change in vision, no earache, sore throat or sinus congestion. NECK: No pain or stiffness. CARDIOVASCULAR: No chest pain or pressure. No palpitations. PULMONARY: No shortness of breath, cough or wheeze. GASTROINTESTINAL: No abdominal pain, nausea, vomiting or diarrhea, melena or bright red blood per rectum. GENITOURINARY: No urinary frequency, urgency, hesitancy or dysuria. MUSCULOSKELETAL: No joint or muscle pain, no back pain, no recent trauma. DERMATOLOGIC: No rash, no itching, no lesions. ENDOCRINE: No polyuria, polydipsia, no heat or cold intolerance. No recent change in weight. HEMATOLOGICAL: No anemia or easy bruising or bleeding. NEUROLOGIC: No headache, seizures, numbness, tingling or weakness.  PSYCHIATRIC: No depression, no loss of interest in normal activity or change in sleep pattern.     Exam:   BP 124/70   Ht 5\' 1"  (1.549 m)   Wt 132 lb (59.9 kg)   BMI 24.94 kg/m   Body mass index is 24.94 kg/m.  General appearance : Well developed well nourished female. No acute distress HEENT: Eyes: no retinal hemorrhage or exudates,  Neck supple, trachea midline, no carotid bruits, no thyroidmegaly Lungs: Clear to auscultation, no rhonchi or wheezes, or rib retractions  Heart: Regular rate and rhythm, no murmurs or gallops Breast:Examined in sitting and supine position were symmetrical in appearance, no palpable masses or tenderness,  no skin retraction, no nipple inversion, no nipple discharge, no skin discoloration, no axillary or supraclavicular lymphadenopathy Abdomen: no palpable masses or tenderness, no rebound or guarding Extremities: no edema or skin discoloration or tenderness  Pelvic: Vulva: Normal             Vagina: No gross lesions or discharge.  Pap reflex done.  Cervix/Uterus absent  Adnexa  Without masses or tenderness  Anus: Normal   Assessment/Plan:  73 y.o. female for annual exam   1. Encounter for Papanicolaou smear of vagina as part of routine gynecological examination Gynecologic exam status post total abdominal hysterectomy and menopause.  Pap test showing ASCUS with negative high-risk HPV in September 2018, Pap reflex done today.  Breast exam normal.  Screening mammogram November 2019 was negative.  Colonoscopy 2016.  Good body mass index at 24.94.  Continue with fitness and healthy nutrition.  Health labs with family physician.  Patient will address short-term memory issues with family physician.  2. ASCUS of cervix with negative high risk HPV  3. S/P TAH (total abdominal hysterectomy)  4. Postmenopause Postmenopausal, well on no hormone replacement therapy.  5. Osteopenia of multiple sites Bone density November 2017 showing osteopenia.  Patient  will schedule a bone density now through her family physician.  Recommend vitamin D supplements, calcium intake of 1200 mg daily and regular weightbearing physical activities.  Other orders - cholecalciferol (VITAMIN D3) 25 MCG (1000 UT) tablet; Take 1,000 Units by mouth daily. - vitamin B-12 (CYANOCOBALAMIN) 500 MCG tablet; Take 500 mcg by mouth daily.  Princess Bruins MD, 2:38 PM 11/12/2019

## 2019-11-12 NOTE — Addendum Note (Signed)
Addended by: Thurnell Garbe A on: 11/12/2019 04:05 PM   Modules accepted: Orders

## 2019-11-12 NOTE — Patient Instructions (Signed)
1. Encounter for Papanicolaou smear of vagina as part of routine gynecological examination Gynecologic exam status post total abdominal hysterectomy and menopause.  Pap test showing ASCUS with negative high-risk HPV in September 2018, Pap reflex done today.  Breast exam normal.  Screening mammogram November 2019 was negative.  Colonoscopy 2016.  Good body mass index at 24.94.  Continue with fitness and healthy nutrition.  Health labs with family physician.  Patient will address short-term memory issues with family physician.  2. ASCUS of cervix with negative high risk HPV  3. S/P TAH (total abdominal hysterectomy)  4. Postmenopause Postmenopausal, well on no hormone replacement therapy.  5. Osteopenia of multiple sites Bone density November 2017 showing osteopenia.  Patient will schedule a bone density now through her family physician.  Recommend vitamin D supplements, calcium intake of 1200 mg daily and regular weightbearing physical activities.  Other orders - cholecalciferol (VITAMIN D3) 25 MCG (1000 UT) tablet; Take 1,000 Units by mouth daily. - vitamin B-12 (CYANOCOBALAMIN) 500 MCG tablet; Take 500 mcg by mouth daily.  Deborah Barton, it was a pleasure seeing you today!  I will inform you of your results as soon as they are available.

## 2019-11-13 LAB — PAP IG W/ RFLX HPV ASCU

## 2019-11-19 ENCOUNTER — Other Ambulatory Visit: Payer: Self-pay | Admitting: Internal Medicine

## 2019-11-19 DIAGNOSIS — Z1231 Encounter for screening mammogram for malignant neoplasm of breast: Secondary | ICD-10-CM

## 2019-12-03 ENCOUNTER — Other Ambulatory Visit: Payer: Self-pay

## 2019-12-03 ENCOUNTER — Telehealth: Payer: Self-pay | Admitting: Internal Medicine

## 2019-12-03 ENCOUNTER — Encounter: Payer: Self-pay | Admitting: Internal Medicine

## 2019-12-03 ENCOUNTER — Ambulatory Visit (INDEPENDENT_AMBULATORY_CARE_PROVIDER_SITE_OTHER): Payer: Medicare Other | Admitting: Internal Medicine

## 2019-12-03 VITALS — BP 124/70 | HR 84 | Temp 98.3°F | Ht 61.0 in | Wt 132.0 lb

## 2019-12-03 DIAGNOSIS — I10 Essential (primary) hypertension: Secondary | ICD-10-CM | POA: Diagnosis not present

## 2019-12-03 DIAGNOSIS — F411 Generalized anxiety disorder: Secondary | ICD-10-CM | POA: Diagnosis not present

## 2019-12-03 DIAGNOSIS — G609 Hereditary and idiopathic neuropathy, unspecified: Secondary | ICD-10-CM | POA: Diagnosis not present

## 2019-12-03 DIAGNOSIS — Z23 Encounter for immunization: Secondary | ICD-10-CM | POA: Diagnosis not present

## 2019-12-03 DIAGNOSIS — E2839 Other primary ovarian failure: Secondary | ICD-10-CM

## 2019-12-03 DIAGNOSIS — Z0000 Encounter for general adult medical examination without abnormal findings: Secondary | ICD-10-CM | POA: Diagnosis not present

## 2019-12-03 DIAGNOSIS — Z1159 Encounter for screening for other viral diseases: Secondary | ICD-10-CM

## 2019-12-03 MED ORDER — ZOSTER VAC RECOMB ADJUVANTED 50 MCG/0.5ML IM SUSR: 0.5000 mL | Freq: Once | INTRAMUSCULAR | 1 refills | Status: AC

## 2019-12-03 NOTE — Assessment & Plan Note (Signed)
Taking cymbalta which is still helping with scalp tingling.

## 2019-12-03 NOTE — Telephone Encounter (Signed)
Patient is calling to ask if Dr. Sharlet Barton can order an EKG. And she will go downstairs tomorrow to get her lab work. Please advise [phone]

## 2019-12-03 NOTE — Telephone Encounter (Signed)
Patient states she is not having any chest or heart pain.  She states that she often gets a EKG as part of her CPE.  Please follow up with patient in regard.

## 2019-12-03 NOTE — Telephone Encounter (Signed)
Do you want patient to have an ekg?

## 2019-12-03 NOTE — Assessment & Plan Note (Signed)
Flu shot up to date. Pneumonia complete. Shingrix given rx. Tetanus given today due 2030. Colonoscopy due, declines due to covid-19 but will get within the next year. Mammogram ordered, due, pap smear aged out and dexa ordered today. Counseled about sun safety and mole surveillance. Counseled about the dangers of distracted driving. Given 10 year screening recommendations.

## 2019-12-03 NOTE — Telephone Encounter (Signed)
LVM informing patient of MD response  

## 2019-12-03 NOTE — Telephone Encounter (Signed)
She had EKG last year, we typically do these every 3-5 years so she is not due for one now.

## 2019-12-03 NOTE — Assessment & Plan Note (Signed)
Taking cymbalta and rare lorazepam. Counseled about risk of falls and memory with lorazepam. Continue.

## 2019-12-03 NOTE — Assessment & Plan Note (Signed)
BP at goal off meds currently.

## 2019-12-03 NOTE — Patient Instructions (Signed)
Health Maintenance, Female Adopting a healthy lifestyle and getting preventive care are important in promoting health and wellness. Ask your health care provider about:  The right schedule for you to have regular tests and exams.  Things you can do on your own to prevent diseases and keep yourself healthy. What should I know about diet, weight, and exercise? Eat a healthy diet   Eat a diet that includes plenty of vegetables, fruits, low-fat dairy products, and lean protein.  Do not eat a lot of foods that are high in solid fats, added sugars, or sodium. Maintain a healthy weight Body mass index (BMI) is used to identify weight problems. It estimates body fat based on height and weight. Your health care provider can help determine your BMI and help you achieve or maintain a healthy weight. Get regular exercise Get regular exercise. This is one of the most important things you can do for your health. Most adults should:  Exercise for at least 150 minutes each week. The exercise should increase your heart rate and make you sweat (moderate-intensity exercise).  Do strengthening exercises at least twice a week. This is in addition to the moderate-intensity exercise.  Spend less time sitting. Even light physical activity can be beneficial. Watch cholesterol and blood lipids Have your blood tested for lipids and cholesterol at 72 years of age, then have this test every 5 years. Have your cholesterol levels checked more often if:  Your lipid or cholesterol levels are high.  You are older than 72 years of age.  You are at high risk for heart disease. What should I know about cancer screening? Depending on your health history and family history, you may need to have cancer screening at various ages. This may include screening for:  Breast cancer.  Cervical cancer.  Colorectal cancer.  Skin cancer.  Lung cancer. What should I know about heart disease, diabetes, and high blood  pressure? Blood pressure and heart disease  High blood pressure causes heart disease and increases the risk of stroke. This is more likely to develop in people who have high blood pressure readings, are of African descent, or are overweight.  Have your blood pressure checked: ? Every 3-5 years if you are 18-39 years of age. ? Every year if you are 40 years old or older. Diabetes Have regular diabetes screenings. This checks your fasting blood sugar level. Have the screening done:  Once every three years after age 40 if you are at a normal weight and have a low risk for diabetes.  More often and at a younger age if you are overweight or have a high risk for diabetes. What should I know about preventing infection? Hepatitis B If you have a higher risk for hepatitis B, you should be screened for this virus. Talk with your health care provider to find out if you are at risk for hepatitis B infection. Hepatitis C Testing is recommended for:  Everyone born from 1945 through 1965.  Anyone with known risk factors for hepatitis C. Sexually transmitted infections (STIs)  Get screened for STIs, including gonorrhea and chlamydia, if: ? You are sexually active and are younger than 72 years of age. ? You are older than 72 years of age and your health care provider tells you that you are at risk for this type of infection. ? Your sexual activity has changed since you were last screened, and you are at increased risk for chlamydia or gonorrhea. Ask your health care provider if   you are at risk.  Ask your health care provider about whether you are at high risk for HIV. Your health care provider may recommend a prescription medicine to help prevent HIV infection. If you choose to take medicine to prevent HIV, you should first get tested for HIV. You should then be tested every 3 months for as long as you are taking the medicine. Pregnancy  If you are about to stop having your period (premenopausal) and  you may become pregnant, seek counseling before you get pregnant.  Take 400 to 800 micrograms (mcg) of folic acid every day if you become pregnant.  Ask for birth control (contraception) if you want to prevent pregnancy. Osteoporosis and menopause Osteoporosis is a disease in which the bones lose minerals and strength with aging. This can result in bone fractures. If you are 65 years old or older, or if you are at risk for osteoporosis and fractures, ask your health care provider if you should:  Be screened for bone loss.  Take a calcium or vitamin D supplement to lower your risk of fractures.  Be given hormone replacement therapy (HRT) to treat symptoms of menopause. Follow these instructions at home: Lifestyle  Do not use any products that contain nicotine or tobacco, such as cigarettes, e-cigarettes, and chewing tobacco. If you need help quitting, ask your health care provider.  Do not use street drugs.  Do not share needles.  Ask your health care provider for help if you need support or information about quitting drugs. Alcohol use  Do not drink alcohol if: ? Your health care provider tells you not to drink. ? You are pregnant, may be pregnant, or are planning to become pregnant.  If you drink alcohol: ? Limit how much you use to 0-1 drink a day. ? Limit intake if you are breastfeeding.  Be aware of how much alcohol is in your drink. In the [place] one drink equals one 12 oz bottle of beer (355 mL), one 5 oz glass of wine (148 mL), or one 1 oz glass of hard liquor (44 mL). General instructions  Schedule regular health, dental, and eye exams.  Stay current with your vaccines.  Tell your health care provider if: ? You often feel depressed. ? You have ever been abused or do not feel safe at home. Summary  Adopting a healthy lifestyle and getting preventive care are important in promoting health and wellness.  Follow your health care provider's instructions about healthy  diet, exercising, and getting tested or screened for diseases.  Follow your health care provider's instructions on monitoring your cholesterol and blood pressure. This information is not intended to replace advice given to you by your health care provider. Make sure you discuss any questions you have with your health care provider. Document Released: 06/28/2011 Document Revised: 12/06/2018 Document Reviewed: 12/06/2018 Elsevier Patient Education  2020 [employer] Inc.  

## 2019-12-03 NOTE — Progress Notes (Signed)
Subjective:   Patient ID: Deborah Barton, female    DOB: February 20, 1946, 73 y.o.   MRN: [id number]  HPI Here for medicare wellness and physical, no new complaints. Please see A/P for status and treatment of chronic medical problems.   Diet: heart healthy Physical activity: active Depression/mood screen: negative Hearing: intact to whispered voice Visual acuity: grossly normal, performs annual eye exam, s/p cataract times 2 ADLs: capable Fall risk: none Home safety: good Cognitive evaluation: intact to orientation, naming, recall and repetition EOL planning: adv directives discussed    Office Visit from 12/03/2019 in [place]  PHQ-2 Total Score  1       I have personally reviewed and have noted 1. The patient's medical and social history - reviewed today no changes 2. Their use of alcohol, tobacco or illicit drugs 3. Their current medications and supplements 4. The patient's functional ability including ADL's, fall risks, home safety risks and hearing or visual impairment. 5. Diet and physical activities 6. Evidence for depression or mood disorders 7. Care team reviewed and updated  Patient Care Team: Hoyt Koch, MD as PCP - General (Internal Medicine) Past Medical History:  Diagnosis Date  . Acid reflux   . Anxiety   . Cataract   . Hypertension   . Neuropathy    Past Surgical History:  Procedure Laterality Date  . ABDOMINAL HYSTERECTOMY  1994  . CHOLECYSTECTOMY  1975   Family History  Problem Relation Age of Onset  . Anemia Other   . Arthritis Other   . Asthma Other   . Depression Other   . Hypertension Other   . Stroke Other   . Cancer Sister        uterine  . Cancer Maternal Aunt        ovarian  . Cancer Maternal Grandmother        ovarian   . Breast cancer Paternal Grandmother   . Colon cancer Neg Hx   . Esophageal cancer Neg Hx   . Stomach cancer Neg Hx   . Rectal cancer Neg Hx    Review of Systems   Constitutional: Negative.   HENT: Negative.   Eyes: Negative.   Respiratory: Negative for cough, chest tightness and shortness of breath.   Cardiovascular: Negative for chest pain, palpitations and leg swelling.  Gastrointestinal: Negative for abdominal distention, abdominal pain, constipation, diarrhea, nausea and vomiting.  Musculoskeletal: Positive for arthralgias.  Skin: Negative.   Neurological: Negative.   Psychiatric/Behavioral: Negative.     Objective:  Physical Exam Constitutional:      Appearance: She is well-developed.  HENT:     Head: Normocephalic and atraumatic.  Neck:     Musculoskeletal: Normal range of motion.  Cardiovascular:     Rate and Rhythm: Normal rate and regular rhythm.  Pulmonary:     Effort: Pulmonary effort is normal. No respiratory distress.     Breath sounds: Normal breath sounds. No wheezing or rales.  Abdominal:     General: Bowel sounds are normal. There is no distension.     Palpations: Abdomen is soft.     Tenderness: There is no abdominal tenderness. There is no rebound.  Skin:    General: Skin is warm and dry.  Neurological:     Mental Status: She is alert and oriented to person, place, and time.     Coordination: Coordination normal.     Vitals:   12/03/19 1014  BP: 124/70  Pulse: 84  Temp: 98.3 F (36.8 C)  TempSrc: Oral  SpO2: 98%  Weight: 132 lb (59.9 kg)  Height: 5\' 1"  (1.549 m)    This visit occurred during the SARS-CoV-2 public health emergency.  Safety protocols were in place, including screening questions prior to the visit, additional usage of staff PPE, and extensive cleaning of exam room while observing appropriate contact time as indicated for disinfecting solutions.   Assessment & Plan:  Tdap given at visit

## 2019-12-04 ENCOUNTER — Ambulatory Visit (INDEPENDENT_AMBULATORY_CARE_PROVIDER_SITE_OTHER)
Admission: RE | Admit: 2019-12-04 | Discharge: 2019-12-04 | Disposition: A | Discharge status: Home / Self Care | Payer: Medicare Other | Source: Ambulatory Visit | Attending: Internal Medicine | Admitting: Internal Medicine

## 2019-12-04 ENCOUNTER — Other Ambulatory Visit (INDEPENDENT_AMBULATORY_CARE_PROVIDER_SITE_OTHER): Payer: Medicare Other

## 2019-12-04 DIAGNOSIS — I10 Essential (primary) hypertension: Secondary | ICD-10-CM

## 2019-12-04 DIAGNOSIS — E2839 Other primary ovarian failure: Secondary | ICD-10-CM

## 2019-12-04 DIAGNOSIS — Z1159 Encounter for screening for other viral diseases: Secondary | ICD-10-CM | POA: Diagnosis not present

## 2019-12-04 LAB — LIPID PANEL
Cholesterol: 224 mg/dL — ABNORMAL HIGH (ref 0–200)
HDL: 70 mg/dL (ref >39.00)
LDL Cholesterol: 124 mg/dL — ABNORMAL HIGH (ref 0–99)
NonHDL: 154.27
Total CHOL/HDL Ratio: 3
Triglycerides: 151 mg/dL — ABNORMAL HIGH (ref 0.0–149.0)
VLDL: 30.2 mg/dL (ref 0.0–40.0)

## 2019-12-04 LAB — COMPREHENSIVE METABOLIC PANEL
ALT: 15 U/L (ref 0–35)
AST: 21 U/L (ref 0–37)
Albumin: 4.3 g/dL (ref 3.5–5.2)
Alkaline Phosphatase: 103 U/L (ref 39–117)
BUN: 16 mg/dL (ref 6–23)
CO2: 28 mEq/L (ref 19–32)
Calcium: 9.4 mg/dL (ref 8.4–10.5)
Chloride: 102 mEq/L (ref 96–112)
Creatinine, Ser: 0.96 mg/dL (ref 0.40–1.20)
GFR: 69.13 mL/min (ref >60.00)
Glucose, Bld: 84 mg/dL (ref 70–99)
Potassium: 3.9 mEq/L (ref 3.5–5.1)
Sodium: 137 mEq/L (ref 135–145)
Total Bilirubin: 0.5 mg/dL (ref 0.2–1.2)
Total Protein: 7.2 g/dL (ref 6.0–8.3)

## 2019-12-04 LAB — CBC
HCT: 33.6 % — ABNORMAL LOW (ref 36.0–46.0)
Hemoglobin: 10.8 g/dL — ABNORMAL LOW (ref 12.0–15.0)
MCHC: 32.2 g/dL (ref 30.0–36.0)
MCV: 72 fl — ABNORMAL LOW (ref 78.0–100.0)
Platelets: 296 10*3/uL (ref 150.0–400.0)
RBC: 4.67 Mil/uL (ref 3.87–5.11)
RDW: 15.4 % (ref 11.5–15.5)
WBC: 8.7 10*3/uL (ref 4.0–10.5)

## 2019-12-04 LAB — VITAMIN B12: Vitamin B-12: 1477 pg/mL — ABNORMAL HIGH (ref 211–911)

## 2019-12-04 LAB — VITAMIN D 25 HYDROXY (VIT D DEFICIENCY, FRACTURES): VITD: 43.65 ng/mL (ref 30.00–100.00)

## 2019-12-04 LAB — TSH: TSH: 2.03 u[IU]/mL (ref 0.35–4.50)

## 2019-12-04 LAB — HEMOGLOBIN A1C: Hgb A1c MFr Bld: 5.8 % (ref 4.6–6.5)

## 2019-12-05 LAB — HEPATITIS C ANTIBODY
Hepatitis C Ab: NONREACTIVE
SIGNAL TO CUT-OFF: 0.01 (ref <1.00)

## 2019-12-06 ENCOUNTER — Other Ambulatory Visit: Payer: Self-pay

## 2019-12-06 MED ORDER — DULOXETINE HCL 60 MG PO CPEP: 60.0000 mg | ORAL_CAPSULE | Freq: Every day | ORAL | 3 refills | Status: DC

## 2019-12-06 MED ORDER — CYCLOBENZAPRINE HCL 5 MG PO TABS: 5.0000 mg | ORAL_TABLET | Freq: Three times a day (TID) | ORAL | 5 refills | Status: DC | PRN

## 2019-12-06 NOTE — Telephone Encounter (Signed)
Control database checked last refill: 05/22/2019 30 tabs LOV: 12/03/2019 NOV: 12/03/2020

## 2019-12-07 MED ORDER — LORAZEPAM 1 MG PO TABS: 1.0000 mg | ORAL_TABLET | Freq: Every evening | ORAL | 2 refills | Status: DC | PRN

## 2019-12-22 ENCOUNTER — Emergency Department (HOSPITAL_COMMUNITY)
Admission: EM | Admit: 2019-12-22 | Discharge: 2019-12-22 | Disposition: A | Discharge status: Home / Self Care | Payer: Medicare Other | Attending: Emergency Medicine | Admitting: Emergency Medicine

## 2019-12-22 ENCOUNTER — Encounter (HOSPITAL_COMMUNITY): Payer: Self-pay | Admitting: Emergency Medicine

## 2019-12-22 ENCOUNTER — Other Ambulatory Visit: Payer: Self-pay

## 2019-12-22 DIAGNOSIS — U071 COVID-19: Secondary | ICD-10-CM | POA: Diagnosis not present

## 2019-12-22 DIAGNOSIS — J111 Influenza due to unidentified influenza virus with other respiratory manifestations: Secondary | ICD-10-CM

## 2019-12-22 DIAGNOSIS — Z79899 Other long term (current) drug therapy: Secondary | ICD-10-CM | POA: Diagnosis not present

## 2019-12-22 DIAGNOSIS — I10 Essential (primary) hypertension: Secondary | ICD-10-CM | POA: Insufficient documentation

## 2019-12-22 DIAGNOSIS — J029 Acute pharyngitis, unspecified: Secondary | ICD-10-CM | POA: Diagnosis present

## 2019-12-22 DIAGNOSIS — Z20822 Contact with and (suspected) exposure to covid-19: Secondary | ICD-10-CM

## 2019-12-22 LAB — BASIC METABOLIC PANEL
Anion gap: 9 (ref 5–15)
BUN: 12 mg/dL (ref 8–23)
CO2: 28 mmol/L (ref 22–32)
Calcium: 9.5 mg/dL (ref 8.9–10.3)
Chloride: 101 mmol/L (ref 98–111)
Creatinine, Ser: 0.75 mg/dL (ref 0.44–1.00)
GFR calc Af Amer: >60 mL/min (ref >60)
GFR calc non Af Amer: >60 mL/min (ref >60)
Glucose, Bld: 113 mg/dL — ABNORMAL HIGH (ref 70–99)
Potassium: 4.2 mmol/L (ref 3.5–5.1)
Sodium: 138 mmol/L (ref 135–145)

## 2019-12-22 LAB — SARS CORONAVIRUS 2 (TAT 6-24 HRS): SARS Coronavirus 2: POSITIVE — AB

## 2019-12-22 MED ORDER — DEXAMETHASONE 4 MG PO TABS
10.0000 mg | ORAL_TABLET | Freq: Once | ORAL | Status: AC
  Administered 2019-12-22: 10 mg via ORAL
  Filled 2019-12-22: qty 2

## 2019-12-22 NOTE — ED Triage Notes (Signed)
Pt presents with concerns for Covid. Pt presents with chest congestion, sore throat, productive cough, and headache. Pt concerned about possible Covid exposure due to husbands occupation.

## 2019-12-22 NOTE — ED Provider Notes (Signed)
[facility] DEPT Provider Note   CSN: [id number] Arrival date & time: 12/22/19  0240   History Chief Complaint  Patient presents with  . Generalized Body Aches  . Cough    Deborah Barton is a 73 y.o. female.  The history is provided by the patient.  Cough She has history of hypertension and comes in because of concern for COVID-19.  Her husband works as a Education officer, museum and is probably exposed on a regular basis.  For about the last week, she has had sore throat and nasal congestion.  She denies cough or dyspnea.  She denies fever, chills, sweats.  There has been some nausea but no vomiting or diarrhea.  She does have some body aches.  She denies loss of smell or taste.  Past Medical History:  Diagnosis Date  . Acid reflux   . Anxiety   . Cataract   . Hypertension   . Neuropathy     Patient Active Problem List   Diagnosis Date Noted  . Forgetfulness 06/20/2018  . OA (osteoarthritis) of knee 01/05/2018  . Routine general medical examination at a health care facility 11/04/2015  . Essential hypertension 01/27/2015  . Hereditary and idiopathic peripheral neuropathy 01/27/2015  . Generalized anxiety disorder 01/27/2015  . 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 ABDOMINAL HYSTERECTOMY  1994  . CHOLECYSTECTOMY  1975     OB History    Gravida  2   Para  2   Term      Preterm      AB      Living  2     SAB      TAB      Ectopic      Multiple      Live Births              Family History  Problem Relation Age of Onset  . Anemia Other   . Arthritis Other   . Asthma Other   . Depression Other   . Hypertension Other   . Stroke Other   . Cancer Sister        uterine  . Cancer Maternal Aunt        ovarian  . Cancer Maternal Grandmother        ovarian   . Breast cancer Paternal Grandmother   . Colon cancer Neg Hx   . Esophageal cancer Neg Hx   . Stomach  cancer Neg Hx   . Rectal cancer Neg Hx     Social History   Tobacco Use  . Smoking status: Never Smoker  . Smokeless tobacco: Never Used  Substance Use Topics  . Alcohol use: Yes    Alcohol/week: 3.0 standard drinks    Types: 3 Standard drinks or equivalent per week    Comment: occasionally  . Drug use: No    Home Medications Prior to Admission medications   Medication Sig Start Date End Date Taking? Authorizing Provider  acetaminophen (TYLENOL) 500 MG tablet Take 1,000 mg by mouth every 6 (six) hours as needed for moderate pain.    [provider]  Apoaequorin (PREVAGEN PO) Take by mouth.    [provider]  Ascorbic Acid (VITAMIN C) 1000 MG tablet Take 1,000 mg by mouth daily.    [provider]  cholecalciferol (VITAMIN D3) 25 MCG (1000 UT) tablet Take 1,000 Units by mouth daily.    [provider]  cyclobenzaprine (FLEXERIL) 5 MG tablet Take 1 tablet (5 mg total) by mouth 3 (three) times daily as needed for muscle spasms. 12/06/19   Hoyt Koch, MD  DULoxetine (CYMBALTA) 60 MG capsule Take 1 capsule (60 mg total) by mouth daily. 12/06/19   Hoyt Koch, MD  hydroxypropyl methylcellulose / hypromellose (ISOPTO TEARS / GONIOVISC) 2.5 % ophthalmic solution Place 1 drop into both eyes as needed for dry eyes.    [provider]  ibuprofen (ADVIL,MOTRIN) 200 MG tablet Take 400 mg by mouth every 6 (six) hours as needed for headache.     [provider]  LORazepam (ATIVAN) 1 MG tablet Take 1 tablet (1 mg total) by mouth at bedtime as needed for anxiety. 12/07/19   Hoyt Koch, MD  Multiple Vitamin (MULTIVITAMIN WITH MINERALS) TABS tablet Take 1 tablet by mouth daily. Adult 50+ vit.    [provider]  Omega-3 Fatty Acids (FISH OIL PO) Take 1 capsule by mouth daily.    [provider]  vitamin B-12 (CYANOCOBALAMIN) 500 MCG tablet Take 500 mcg by mouth daily.    [provider]     Allergies    Patient has no known allergies.  Review of Systems   Review of Systems  Respiratory: Positive for cough.   All other systems reviewed and are negative.   Physical Exam Updated Vital Signs BP (!) 138/94 (BP Location: Right Arm)   Pulse 84   Temp 99.6 F (37.6 C) (Oral)   Resp 16   Ht 5\' 1"  (1.549 m)   Wt 59.9 kg   SpO2 97%   BMI 24.94 kg/m   Physical Exam Vitals and nursing note reviewed.    73 year old female, resting comfortably and in no acute distress. Vital signs are significant for mildly elevated blood pressure. Oxygen saturation is 97%, which is normal. Head is normocephalic and atraumatic. PERRLA, EOMI. Oropharynx is clear. Neck is nontender and supple without adenopathy or JVD. Back is nontender and there is no CVA tenderness. Lungs are clear without rales, wheezes, or rhonchi. Chest is nontender. Heart has regular rate and rhythm without murmur. Abdomen is soft, flat, nontender without masses or hepatosplenomegaly and peristalsis is normoactive. Extremities have no cyanosis or edema, full range of motion is present. Skin is warm and dry without rash. Neurologic: Mental status is normal, cranial nerves are intact, there are no motor or sensory deficits.  ED Results / Procedures / Treatments   Labs (all labs ordered are listed, but only abnormal results are displayed) Labs Reviewed  SARS CORONAVIRUS 2 (TAT 6-24 HRS)  CBC WITH DIFFERENTIAL/PLATELET  BASIC METABOLIC PANEL   Procedures Procedures   Medications Ordered in ED Medications  dexamethasone (DECADRON) tablet 10 mg (has no administration in time range)    ED Course  I have reviewed the triage vital signs and the nursing notes.  Pertinent lab results that were available during my care of the patient were reviewed by me and considered in my medical decision making (see chart for details).  MDM Rules/Calculators/A&P Influenza-like illness worrisome for possible COVID-19.  Swab is  sent for COVID-19.  With symptoms of sore throat but negative exam, will give single dose of dexamethasone.  Old records reviewed, and she has no relevant past visits.  She is discharged with instructions to drink plenty fluids, take acetaminophen as needed for fever aching.  Return precautions given with main concern if she develops significant dyspnea she should return for reevaluation.  Final Clinical Impression(s) / ED Diagnoses Final diagnoses:  Suspected COVID-19 virus infection  Influenza-like illness    Rx / DC Orders ED Discharge Orders    None       Delora Fuel, [place] [id number] [phone]

## 2019-12-22 NOTE — Discharge Instructions (Signed)
Return if you start having difficulty breathing.  Take acetaminophen as needed for fever or aching.

## 2019-12-23 ENCOUNTER — Telehealth: Payer: Self-pay | Admitting: Unknown Physician Specialty

## 2019-12-23 ENCOUNTER — Other Ambulatory Visit: Payer: Self-pay | Admitting: Unknown Physician Specialty

## 2019-12-23 DIAGNOSIS — U071 COVID-19: Secondary | ICD-10-CM

## 2019-12-23 NOTE — Telephone Encounter (Signed)
  I connected by phone with Deborah Barton on 12/23/2019 at 10:24 AM to discuss the potential use of an new treatment for mild to moderate COVID-19 viral infection in non-hospitalized patients.  This patient is a 73 y.o. female that meets the FDA criteria for Emergency Use Authorization of bamlanivimab or casirivimab\imdevimab.  Has a (+) direct SARS-CoV-2 viral test result  Has mild or moderate COVID-19   Is ? 72 years of age and weighs ? 40 kg  Is NOT hospitalized due to COVID-19  Is NOT requiring oxygen therapy or requiring an increase in baseline oxygen flow rate due to COVID-19  Is within 10 days of symptom onset  Has at least one of the high risk factor(s) for progression to severe COVID-19 and/or hospitalization as defined in EUA.  Specific high risk criteria : >/= 72 yo   I have spoken and communicated the following to the patient or parent/caregiver:  1. FDA has authorized the emergency use of bamlanivimab and casirivimab\imdevimab for the treatment of mild to moderate COVID-19 in adults and pediatric patients with positive results of direct SARS-CoV-2 viral testing who are 16 years of age and older weighing at least 40 kg, and who are at high risk for progressing to severe COVID-19 and/or hospitalization.  2. The significant known and potential risks and benefits of bamlanivimab and casirivimab\imdevimab, and the extent to which such potential risks and benefits are unknown.  3. Information on available alternative treatments and the risks and benefits of those alternatives, including clinical trials.  4. Patients treated with bamlanivimab and casirivimab\imdevimab should continue to self-isolate and use infection control measures (e.g., wear mask, isolate, social distance, avoid sharing personal items, clean and disinfect "high touch" surfaces, and frequent handwashing) according to CDC guidelines.   5. The patient or parent/caregiver has the option to accept or refuse  bamlanivimab or casirivimab\imdevimab .  After reviewing this information with the patient, The patient agreed to proceed with receiving the bamlanimivab infusion and will be provided a copy of the Fact sheet prior to receiving the infusion.Deborah Barton 12/23/2019 10:24 AM  Symptom onset 12/22

## 2019-12-24 ENCOUNTER — Telehealth (HOSPITAL_COMMUNITY): Payer: Self-pay

## 2019-12-25 ENCOUNTER — Ambulatory Visit (HOSPITAL_COMMUNITY)
Admission: RE | Admit: 2019-12-25 | Discharge: 2019-12-25 | Disposition: A | Discharge status: Home / Self Care | Payer: Medicare Other | Source: Ambulatory Visit | Attending: Pulmonary Disease | Admitting: Pulmonary Disease

## 2019-12-25 DIAGNOSIS — Z23 Encounter for immunization: Secondary | ICD-10-CM | POA: Diagnosis present

## 2019-12-25 DIAGNOSIS — U071 COVID-19: Secondary | ICD-10-CM | POA: Insufficient documentation

## 2019-12-25 MED ORDER — FAMOTIDINE IN NACL 20-0.9 MG/50ML-% IV SOLN: 20.0000 mg | Freq: Once | INTRAVENOUS | Status: DC | PRN

## 2019-12-25 MED ORDER — METHYLPREDNISOLONE SODIUM SUCC 125 MG IJ SOLR: 125.0000 mg | Freq: Once | INTRAMUSCULAR | Status: DC | PRN

## 2019-12-25 MED ORDER — ALBUTEROL SULFATE HFA 108 (90 BASE) MCG/ACT IN AERS: 2.0000 | INHALATION_SPRAY | Freq: Once | RESPIRATORY_TRACT | Status: DC | PRN

## 2019-12-25 MED ORDER — SODIUM CHLORIDE 0.9 % IV SOLN
INTRAVENOUS | Status: DC | PRN
  Administered 2019-12-25: 250 mL via INTRAVENOUS

## 2019-12-25 MED ORDER — EPINEPHRINE 0.3 MG/0.3ML IJ SOAJ: 0.3000 mg | Freq: Once | INTRAMUSCULAR | Status: DC | PRN

## 2019-12-25 MED ORDER — SODIUM CHLORIDE 0.9 % IV SOLN
700.0000 mg | Freq: Once | INTRAVENOUS | Status: AC
  Administered 2019-12-25: 13:00:00 700 mg via INTRAVENOUS
  Filled 2019-12-25: qty 20

## 2019-12-25 MED ORDER — DIPHENHYDRAMINE HCL 50 MG/ML IJ SOLN: 50.0000 mg | Freq: Once | INTRAMUSCULAR | Status: DC | PRN

## 2019-12-25 NOTE — Progress Notes (Signed)
.   Diagnosis: COVID-19  Physician: Dr. Joya Gaskins  Procedure: [facility] Med: bamlanivimab infusion - Provided patient with bamlanimivab fact sheet for patients, parents and caregivers prior to infusion.  Complications: No immediate complications noted.  Discharge: Discharged home   Babs Sciara 12/25/2019

## 2019-12-25 NOTE — Discharge Instructions (Signed)
10 Things You Can Do to Manage Your COVID-19 Symptoms at Home If you have possible or confirmed COVID-19: 1. Stay home from work and school. And stay away from other public places. If you must go out, avoid using any kind of public transportation, ridesharing, or taxis. 2. Monitor your symptoms carefully. If your symptoms get worse, call your healthcare provider immediately. 3. Get rest and stay hydrated. 4. If you have a medical appointment, call the healthcare provider ahead of time and tell them that you have or may have COVID-19. 5. For medical emergencies, call 911 and notify the dispatch personnel that you have or may have COVID-19. 6. Cover your cough and sneezes with a tissue or use the inside of your elbow. 7. Wash your hands often with soap and water for at least 20 seconds or clean your hands with an alcohol-based hand sanitizer that contains at least 60% alcohol. 8. As much as possible, stay in a specific room and away from other people in your home. Also, you should use a separate bathroom, if available. If you need to be around other people in or outside of the home, wear a mask. 9. Avoid sharing personal items with other people in your household, like dishes, towels, and bedding. 10. Clean all surfaces that are touched often, like counters, tabletops, and doorknobs. Use household cleaning sprays or wipes according to the label instructions. cdc.gov/coronavirus 06/27/2019 This information is not intended to replace advice given to you by your health care provider. Make sure you discuss any questions you have with your health care provider. Document Revised: 11/29/2019 Document Reviewed: 11/29/2019 Elsevier Patient Education  2020 [employer]  

## 2019-12-27 ENCOUNTER — Telehealth: Payer: Self-pay | Admitting: Internal Medicine

## 2019-12-27 NOTE — Telephone Encounter (Signed)
Pt has been informed and expressed understanding.  

## 2019-12-27 NOTE — Telephone Encounter (Signed)
Patient is calling to report that she has COVID 19. Patient report slight shortness of breathe. She there anything that she can take for fatigue and slight shortness of breathe. Please advise. Can anything be called in the pharmacy. Please advise [facility]. Please advise CB- [phone]

## 2019-12-27 NOTE — Telephone Encounter (Signed)
Can do virtual visit to assess. Can take vitamin C and zinc over the counter. If SOB worsens needs to go to ER or urgent care for evaluation.

## 2019-12-31 ENCOUNTER — Ambulatory Visit (INDEPENDENT_AMBULATORY_CARE_PROVIDER_SITE_OTHER): Payer: Medicare Other | Admitting: Internal Medicine

## 2019-12-31 ENCOUNTER — Encounter: Payer: Self-pay | Admitting: Internal Medicine

## 2019-12-31 DIAGNOSIS — K130 Diseases of lips: Secondary | ICD-10-CM

## 2019-12-31 DIAGNOSIS — U071 COVID-19: Secondary | ICD-10-CM

## 2019-12-31 DIAGNOSIS — J209 Acute bronchitis, unspecified: Secondary | ICD-10-CM

## 2019-12-31 DIAGNOSIS — J40 Bronchitis, not specified as acute or chronic: Secondary | ICD-10-CM | POA: Insufficient documentation

## 2019-12-31 MED ORDER — HYDROCODONE-HOMATROPINE 5-1.5 MG/5ML PO SYRP: 5.0000 mL | ORAL_SOLUTION | Freq: Four times a day (QID) | ORAL | 0 refills | Status: DC | PRN

## 2019-12-31 MED ORDER — METHYLPREDNISOLONE 4 MG PO TBPK: ORAL_TABLET | ORAL | 0 refills | Status: DC

## 2019-12-31 MED ORDER — AZITHROMYCIN 250 MG PO TABS: ORAL_TABLET | ORAL | 0 refills | Status: DC

## 2019-12-31 MED ORDER — TRIAMCINOLONE ACETONIDE 0.1 % EX OINT: 1.0000 "application " | TOPICAL_OINTMENT | Freq: Four times a day (QID) | CUTANEOUS | 1 refills | Status: DC

## 2019-12-31 NOTE — Assessment & Plan Note (Addendum)
She is status post infusion. Z-Pak.  Medrol pack

## 2019-12-31 NOTE — Assessment & Plan Note (Signed)
Triamcinolone ointment as needed

## 2019-12-31 NOTE — Assessment & Plan Note (Signed)
Go to ER if shortness of breath is  worse Z-Pak, Hycodan as needed Medrol Dosepak

## 2019-12-31 NOTE — Progress Notes (Signed)
Virtual Visit via Video Note  I connected with Deborah Barton on 12/31/19 at  2:40 PM EST by a video enabled telemedicine application and verified that I am speaking with the correct person using two identifiers.   I discussed the limitations of evaluation and management by telemedicine and the availability of in person appointments. The patient expressed understanding and agreed to proceed.  History of Present Illness: The patient is complaining of mild shortness of breath episodes, cough productive for yellow sputum, anxiety feeling of several days duration.  She was diagnosed with COVID-19 right after Christmas.  There is no nausea vomiting or diarrhea present.  No fever.  She is complaining of chapped lips.  There has been no  chest pain, shortness of breath, abdominal pain, diarrhea, constipation, arthralgias, skin rashes.   Observations/Objective: The patient appears to be in no acute distress, she is not dyspneic during our interview.  Assessment and Plan:  See my Assessment and Plan. Follow Up Instructions:    I discussed the assessment and treatment plan with the patient. The patient was provided an opportunity to ask questions and all were answered. The patient agreed with the plan and demonstrated an understanding of the instructions.   The patient was advised to call back or seek an in-person evaluation if the symptoms worsen or if the condition fails to improve as anticipated.  I provided face-to-face time during this encounter. We were at different locations.   Walker Kehr, MD

## 2020-01-04 ENCOUNTER — Ambulatory Visit (INDEPENDENT_AMBULATORY_CARE_PROVIDER_SITE_OTHER): Payer: Medicare Other | Admitting: Internal Medicine

## 2020-01-04 ENCOUNTER — Encounter: Payer: Self-pay | Admitting: Internal Medicine

## 2020-01-04 ENCOUNTER — Ambulatory Visit: Payer: Self-pay

## 2020-01-04 ENCOUNTER — Other Ambulatory Visit: Payer: Self-pay

## 2020-01-04 DIAGNOSIS — U071 COVID-19: Secondary | ICD-10-CM

## 2020-01-04 NOTE — Telephone Encounter (Signed)
Patient called stating that she has COVID-19.  She has bee treated with infusion, antibiotic, cough medication.  She states she has not started her steroid.  She states she feels SOB with activity. She has a cough especially at night. 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 No heart dx. Care advice read to patient.  She verbalized understanding of all information. Call transferred to office for scheduling.  Reason for Disposition . [1] MILD difficulty breathing (e.g., minimal/no SOB at rest, SOB with walking, pulse <100) AND [2] NEW-onset or WORSE than normal  Answer Assessment - Initial Assessment Questions 1. RESPIRATORY STATUS: "Describe your breathing?" (e.g., wheezing, shortness of breath, unable to speak, severe coughing)     SOB 2. ONSET: "When did this breathing problem begin?"     With Dx of COVID-19 3. PATTERN "Does the difficult breathing come and go, or has it been constant since it started?"      Comes and goes 4. SEVERITY: "How bad is your breathing?" (e.g., mild, moderate, severe)    - MILD: No SOB at rest, mild SOB with walking, speaks normally in sentences, can lay down, no retractions, pulse < 100.    - MODERATE: SOB at rest, SOB with minimal exertion and prefers to sit, cannot lie down flat, speaks in phrases, mild retractions, audible wheezing, pulse 100-120.    - SEVERE: Very SOB at rest, speaks in single words, struggling to breathe, sitting hunched forward, retractions, pulse > 120      Mild with activity 5. RECURRENT SYMPTOM: "Have you had difficulty breathing before?" If so, ask: "When was the last time?" and "What happened that time?"      From COVID-19 6. CARDIAC HISTORY: "Do you have any history of heart disease?" (e.g., heart attack, angina, bypass surgery, angioplasty)      no 7. LUNG HISTORY: "Do you have any history of lung disease?"  (e.g., pulmonary embolus, asthma, emphysema)   sarcoidosis 8. CAUSE: "What do you think is causing the  breathing problem?"      covid 9. OTHER SYMPTOMS: "Do you have any other symptoms? (e.g., dizziness, runny nose, cough, chest pain, fever)     Lightheaded night cough 10. PREGNANCY: "Is there any chance you are pregnant?" "When was your last menstrual period?"       N/A 11. TRAVEL: "Have you traveled out of the country in the last month?" (e.g., travel history, exposures)      N/A  Protocols used: BREATHING DIFFICULTY-A-AH

## 2020-01-04 NOTE — Assessment & Plan Note (Signed)
Advised to take medrol dose pack and given instructions. Overall is improving. Advised to stay home and quarantine until improved. Stay hydrated.

## 2020-01-04 NOTE — Progress Notes (Signed)
Virtual Visit via Video Note  I connected with Velva Harman on 01/04/20 at  3:40 PM EST by a video enabled telemedicine application and verified that I am speaking with the correct person using two identifiers.  The patient and the provider were at separate locations throughout the entire encounter.   I discussed the limitations of evaluation and management by telemedicine and the availability of in person appointments. The patient expressed understanding and agreed to proceed. The patient and the provider were the only parties present for the visit unless noted in HPI below.  History of Present Illness: The patient is a 74 y.o. female with visit for covid-19. Started 12/22/19 and had monoclonal antibody infusion. Has some cough and minimal SOB left. Denies headaches or body aches still. Denies fevers or chills. Overall it is improving but gradually. She took antibiotic and using codeine cough syrup sent in for her. Has not started steroid dose pack as she did not know how to take.   Observations/Objective: Appearance: normal, breathing appears normal, mild cough non-productive during visit, no dyspnea or accessory muscle usage, casual grooming, abdomen does not appear distended, throat normal, memory normal, mental status is A and O times 3  Assessment and Plan: See problem oriented charting  Follow Up Instructions: take medrol dose pack, explained how to use  I discussed the assessment and treatment plan with the patient. The patient was provided an opportunity to ask questions and all were answered. The patient agreed with the plan and demonstrated an understanding of the instructions.   The patient was advised to call back or seek an in-person evaluation if the symptoms worsen or if the condition fails to improve as anticipated.  Hoyt Koch, MD

## 2020-01-08 ENCOUNTER — Ambulatory Visit (INDEPENDENT_AMBULATORY_CARE_PROVIDER_SITE_OTHER): Payer: Medicare Other | Admitting: Internal Medicine

## 2020-01-08 ENCOUNTER — Encounter: Payer: Self-pay | Admitting: Internal Medicine

## 2020-01-08 DIAGNOSIS — U071 COVID-19: Secondary | ICD-10-CM | POA: Diagnosis not present

## 2020-01-08 MED ORDER — HYDROCODONE-HOMATROPINE 5-1.5 MG/5ML PO SYRP: 5.0000 mL | ORAL_SOLUTION | Freq: Four times a day (QID) | ORAL | 0 refills | Status: DC | PRN

## 2020-01-08 MED ORDER — ALBUTEROL SULFATE HFA 108 (90 BASE) MCG/ACT IN AERS: 2.0000 | INHALATION_SPRAY | Freq: Four times a day (QID) | RESPIRATORY_TRACT | 0 refills | Status: DC | PRN

## 2020-01-08 NOTE — Assessment & Plan Note (Signed)
Will finish medrol dose pack. Is improving and encouraged her to give this more time. Can have cough for 4-6 weeks. Fatigue may last as well. Rx albuterol inhaler. Checked [place] narcotic database and no inappropriate fills so rx hydrocodone cough syrup for cough and sleep.

## 2020-01-08 NOTE — Progress Notes (Signed)
Virtual Visit via Video Note  I connected with Deborah Barton on 01/08/20 at  3:00 PM EST by a video enabled telemedicine application and verified that I am speaking with the correct person using two identifiers.  The patient and the provider were at separate locations throughout the entire encounter.   I discussed the limitations of evaluation and management by telemedicine and the availability of in person appointments. The patient expressed understanding and agreed to proceed. The patient and the provider were the only parties present for the visit unless noted in HPI below.  History of Present Illness: The patient is a 75 y.o. female with visit for SOB with Covid-19 on 12/16/19. Taking medrol dose pack and only 2 days left. Started feeling some improvement with this. She is still having cough. Almost out of cough syrup and this really does help her sleep. She denies fevers or chills. Mild nose drainage. SOB which is intermittent and random and overall improving. Still some fatigue and tiredness with activity. Denies worsening symptoms. Overall it is improving. Has tried antibody infusion, azithromycin, hycodan cough syrup, medrol dose pack  Observations/Objective: Appearance: normal, breathing appears normal, minimal coughing during visit, no audible dyspnea during visit although she states she felt a little SOB, casual grooming, abdomen dose not appear distended, throat normal, memory normal, mental status is A and O times 3  Assessment and Plan: See problem oriented charting  Follow Up Instructions: finish medrol dosepack, rx albuterol prn and refill hycodan cough syrup after review of [place] narcotic database  I discussed the assessment and treatment plan with the patient. The patient was provided an opportunity to ask questions and all were answered. The patient agreed with the plan and demonstrated an understanding of the instructions.   The patient was advised to call back or seek an  in-person evaluation if the symptoms worsen or if the condition fails to improve as anticipated.  Hoyt Koch, MD

## 2020-01-11 ENCOUNTER — Ambulatory Visit
Admission: RE | Admit: 2020-01-11 | Discharge: 2020-01-11 | Disposition: A | Discharge status: Home / Self Care | Payer: Medicare Other | Source: Ambulatory Visit | Attending: Internal Medicine | Admitting: Internal Medicine

## 2020-01-11 ENCOUNTER — Other Ambulatory Visit: Payer: Self-pay

## 2020-01-11 DIAGNOSIS — Z1231 Encounter for screening mammogram for malignant neoplasm of breast: Secondary | ICD-10-CM | POA: Diagnosis not present

## 2020-01-23 ENCOUNTER — Telehealth: Payer: Self-pay | Admitting: Internal Medicine

## 2020-01-23 NOTE — Telephone Encounter (Signed)
Pt has been informed and expressed understanding.  

## 2020-01-23 NOTE — Telephone Encounter (Signed)
    Patient calling would like to know if she should get the COVID vaccine next week. Patient concerned because she had monoclonal antibody infusion in December 2020.   Please advise

## 2020-01-23 NOTE — Telephone Encounter (Signed)
It is not recommended to get covid-19 vaccine until 3 months after covid infection so she should wait until end of February at earliest.

## 2020-02-15 DIAGNOSIS — H35013 Changes in retinal vascular appearance, bilateral: Secondary | ICD-10-CM | POA: Diagnosis not present

## 2020-02-15 DIAGNOSIS — H40023 Open angle with borderline findings, high risk, bilateral: Secondary | ICD-10-CM | POA: Diagnosis not present

## 2020-02-15 DIAGNOSIS — H35373 Puckering of macula, bilateral: Secondary | ICD-10-CM | POA: Diagnosis not present

## 2020-02-15 DIAGNOSIS — H35033 Hypertensive retinopathy, bilateral: Secondary | ICD-10-CM | POA: Diagnosis not present

## 2020-02-24 ENCOUNTER — Ambulatory Visit: Payer: Medicare Other

## 2020-03-10 DIAGNOSIS — H04123 Dry eye syndrome of bilateral lacrimal glands: Secondary | ICD-10-CM | POA: Diagnosis not present

## 2020-03-10 DIAGNOSIS — G514 Facial myokymia: Secondary | ICD-10-CM | POA: Diagnosis not present

## 2020-03-10 DIAGNOSIS — H26493 Other secondary cataract, bilateral: Secondary | ICD-10-CM | POA: Diagnosis not present

## 2020-03-10 DIAGNOSIS — H40023 Open angle with borderline findings, high risk, bilateral: Secondary | ICD-10-CM | POA: Diagnosis not present

## 2020-03-10 DIAGNOSIS — H26492 Other secondary cataract, left eye: Secondary | ICD-10-CM | POA: Diagnosis not present

## 2020-03-31 ENCOUNTER — Telehealth: Payer: Self-pay | Admitting: Family Medicine

## 2020-03-31 DIAGNOSIS — M79604 Pain in right leg: Secondary | ICD-10-CM

## 2020-03-31 NOTE — Telephone Encounter (Signed)
Patient is scheduled to see me on the eighth.  She contacted the front desk with the below message from front desk.  Plan to order duplex ultrasound to assess for DVT prior to visit. We will follow-up with patient as scheduled on the eighth.  I have a patient (MRN [id number]) that has been seen by Dr Raeford Razor in the past for right knee pain. He drained it at that time and it seemed to help. She said that about two weeks ago she started to have a lot of pain on her inner thigh and down her leg. She also noticed a burning sensation that started at her foot/ankle and come up to her knee. She has tried ibuprofen, muscle relaxers, ice and heat but nothing helps. She was diagnosed with COVID in December and had her first vaccine 2 weeks ago and is schedule for the second one on the 15th... she mentioned blood clot and seemed very worried.. She is schedule to see you on Thursday but I wanted to make sure that was okay with you. Do you think it could be something more severe?

## 2020-03-31 NOTE — Telephone Encounter (Signed)
LVM for patient informing that she can go to [facility] 04/01/2020 at 11 for her [place]

## 2020-04-01 ENCOUNTER — Ambulatory Visit (HOSPITAL_COMMUNITY)
Admission: RE | Admit: 2020-04-01 | Payer: Medicare Other | Source: Ambulatory Visit | Attending: Family Medicine | Admitting: Family Medicine

## 2020-04-02 ENCOUNTER — Other Ambulatory Visit: Payer: Self-pay

## 2020-04-02 ENCOUNTER — Ambulatory Visit (HOSPITAL_COMMUNITY)
Admission: RE | Admit: 2020-04-02 | Discharge: 2020-04-02 | Disposition: A | Discharge status: Home / Self Care | Payer: Medicare Other | Source: Ambulatory Visit | Attending: Cardiology | Admitting: Cardiology

## 2020-04-02 DIAGNOSIS — M79604 Pain in right leg: Secondary | ICD-10-CM

## 2020-04-02 NOTE — Progress Notes (Signed)
Leg ultrasound fortunately does not show DVT.  It does show a Baker's cyst in the back of the knee. We will discuss this and treat this at the upcoming visit on the 8th.Marland Kitchen

## 2020-04-03 ENCOUNTER — Ambulatory Visit (INDEPENDENT_AMBULATORY_CARE_PROVIDER_SITE_OTHER): Payer: Medicare Other | Admitting: Family Medicine

## 2020-04-03 ENCOUNTER — Ambulatory Visit (INDEPENDENT_AMBULATORY_CARE_PROVIDER_SITE_OTHER): Payer: Medicare Other

## 2020-04-03 ENCOUNTER — Encounter: Payer: Self-pay | Admitting: Family Medicine

## 2020-04-03 ENCOUNTER — Ambulatory Visit: Payer: Self-pay

## 2020-04-03 VITALS — BP 120/78 | HR 80 | Ht 61.0 in | Wt 130.2 lb

## 2020-04-03 DIAGNOSIS — Z8616 Personal history of COVID-19: Secondary | ICD-10-CM

## 2020-04-03 DIAGNOSIS — M7121 Synovial cyst of popliteal space [Baker], right knee: Secondary | ICD-10-CM | POA: Diagnosis not present

## 2020-04-03 DIAGNOSIS — M25561 Pain in right knee: Secondary | ICD-10-CM

## 2020-04-03 DIAGNOSIS — M25461 Effusion, right knee: Secondary | ICD-10-CM | POA: Diagnosis not present

## 2020-04-03 NOTE — Progress Notes (Signed)
I, Wendy Poet, LAT, ATC, am serving as scribe for Dr. Lynne Leader.  Deborah Barton is a 74 y.o. female who presents to [place] at [facility] today for R leg pain.  She was last seen by Dr. Raeford Razor on 01/05/18 for R knee pain.  Currently, pt has been having pain from her R inner knee to her calf and describes a burning sensation from her R ankle to R knee along her anterior lower leg.  She has tried ice, heat, IBU and muscle relaxers.  She had a R LE venous doppler US on 04/02/20.  Today she reports that the majority of her pain is at her R medial knee.  She reports an injury about a year ago when she fell off the treadmill.  She notes intermittent swelling in her R knee.  She denies any R knee mechanical symptoms.   Pertinent review of systems: No fevers or chills  Relevant historical information: History of prior knee injection in the past.   Exam:  BP 120/78 (BP Location: Left Arm, Patient Position: Sitting, Cuff Size: Normal)   Pulse 80   Ht 5\' 1"  (1.549 m)   Wt 130 lb 3.2 oz (59.1 kg)   SpO2 99%   BMI 24.60 kg/m  General: Well Developed, well nourished, and in no acute distress.   MSK:  Right knee trace effusion otherwise normal-appearing Range of motion 0-120 degrees with crepitation. Tender palpation medial joint line.  Not particularly tender posterior knee.  Minimally tender also at pes anserine bursa area. Stable ligamentous exam without laxity. Positive medial McMurray's test negative lateral. Intact flexion and extension strength without pain.  Left knee: Normal-appearing nontender normal motion.  Stable ligamentous exam.  Negative Murray's test.  Intact strength.    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 Await formal radiology review  Diagnostic Limited MSK Ultrasound of: Right knee Quad tendon normal-appearing moderate effusion  present. Patellar tendon intact normal-appearing Medial joint line narrowed with degenerative appearing meniscus with hypoechoic change within mid substance of meniscus indicating chronic old tear. Lateral joint line also narrowed again degenerative.  Meniscus with hypoechoic change within meniscus substance indicating possible old tear. Moderate Baker's cyst present posterior medial knee. Tiny Pes anserine bursa present Normal bony structures otherwise Impression: DJD.  Baker's cyst.  Pes anserine bursitis.  Procedure: Real-time Ultrasound Guided Injection of right knee superior lateral space  Device: Philips Affiniti 50G Images permanently stored and available for review in the ultrasound unit. Verbal informed consent obtained.  Discussed risks and benefits of procedure. Warned about infection bleeding damage to structures skin hypopigmentation and fat atrophy among others. Patient expresses understanding and agreement Time-out conducted.   Noted no overlying erythema, induration, or other signs of local infection.   Skin prepped in a sterile fashion.   Local anesthesia: Topical Ethyl chloride.   With sterile technique and under real time ultrasound guidance:  40 mg of Kenalog and 3 mL of Marcaine injected easily.   Completed without difficulty   Pain immediately resolved suggesting accurate placement of the medication.   Advised to call if fevers/chills, erythema, induration, drainage, or persistent bleeding.   Images permanently stored and available for review in the ultrasound unit.  Impression: Technically successful ultrasound guided injection.        No results found for this or any previous visit (from the past 72 hour(s)). VAS [place] LOWER EXTREMITY  VENOUS (DVT)  Result Date: 04/02/2020  Lower Venous DVT Study Indications: Patient reports having pain at the right medial knee and burning sensation from the lower shin area up towards the knee x 3 weeks. She denies any abnormal  SOB, but does have some SOB while wearing mask.  Risk Factors: Trauma injury to the right leg and knee after falling on a treadmill over one year ago. Comparison Study: NA Performing Technologist: Sharlett Iles RVT  Examination Guidelines: A complete evaluation includes B-mode imaging, spectral Doppler, color Doppler, and power Doppler as needed of all accessible portions of each vessel. Bilateral testing is considered an integral part of a complete examination. Limited examinations for reoccurring indications may be performed as noted. The reflux portion of the exam is performed with the patient in reverse Trendelenburg.  +---------+---------------+---------+-----------+----------+--------------+ RIGHT    CompressibilityPhasicitySpontaneityPropertiesThrombus Aging +---------+---------------+---------+-----------+----------+--------------+ CFV      Full           Yes      Yes                                 +---------+---------------+---------+-----------+----------+--------------+ SFJ      Full           Yes      Yes                                 +---------+---------------+---------+-----------+----------+--------------+ FV Prox  Full           Yes      Yes                                 +---------+---------------+---------+-----------+----------+--------------+ FV Mid   Full                                                        +---------+---------------+---------+-----------+----------+--------------+ FV DistalFull           Yes      Yes                                 +---------+---------------+---------+-----------+----------+--------------+ PFV      Full           Yes      Yes                                 +---------+---------------+---------+-----------+----------+--------------+ POP      Full           Yes      Yes                                 +---------+---------------+---------+-----------+----------+--------------+ PTV      Full            Yes      Yes                                 +---------+---------------+---------+-----------+----------+--------------+  PERO     Full           Yes      Yes                                 +---------+---------------+---------+-----------+----------+--------------+ Gastroc  Full                                                        +---------+---------------+---------+-----------+----------+--------------+ GSV      Full           Yes      Yes                                 +---------+---------------+---------+-----------+----------+--------------+   +----+---------------+---------+-----------+----------+--------------+ LEFTCompressibilityPhasicitySpontaneityPropertiesThrombus Aging +----+---------------+---------+-----------+----------+--------------+ CFV Full           Yes      Yes                                 +----+---------------+---------+-----------+----------+--------------+     Summary: RIGHT: - No evidence of deep vein thrombosis in the lower extremity. No indirect evidence of obstruction proximal to the inguinal ligament. - A cystic structure is found in the popliteal fossa. - There is an avascular anechoic structure posteromedial knee that is likely a Baker's cyst, measuring 4.0 cm in length and 4.2 cm x .82 cm in thickness.  LEFT: - No evidence of common femoral vein obstruction.  *See table(s) above for measurements and observations.    Preliminary        Assessment and Plan: 74 y.o. female with right knee pain ongoing for about 3 weeks.  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 Patient also does have a moderate to small Baker's cyst and a small pes anserine bursitis however these are probably not the main pain generators based on lack of tenderness to palpation significantly in these regions.  Plan for intra-articular injection.   Additionally recommend Voltaren gel.  If not improved patient will return to clinic and will probably proceed with aspiration and injection of Baker's cyst.   PDMP not reviewed this encounter. Orders Placed This Encounter  Procedures  . [place] LIMITED JOINT SPACE STRUCTURES LOW RIGHT(NO LINKED CHARGES)    Order Specific Question:   Reason for Exam (SYMPTOM  OR DIAGNOSIS REQUIRED)    Answer:   R knee pain    Order Specific Question:   Preferred imaging location?    Answer:   [facility]  . DG Knee AP/LAT W/Sun[facility] Right    Standing Status:   Future    Number of Occurrences:   1    Standing Expiration Date:   06/03/2021    Order Specific Question:   Reason for Exam (SYMPTOM  OR DIAGNOSIS REQUIRED)    Answer:   eval knee pain    Order Specific Question:   Preferred imaging location?    Answer:   [facility]    Order Specific Question:   Radiology Contrast Protocol - do NOT remove file path  Answer:   \\[email or web address]   No orders of the defined types were placed in this encounter.    Discussed warning signs or symptoms. Please see discharge instructions. Patient expresses understanding.   The above documentation has been reviewed and is accurate and complete Lynne Leader

## 2020-04-03 NOTE — Patient Instructions (Addendum)
Thank you for coming in today.  Call or go to the ER if you develop a large red swollen joint with extreme pain or oozing puss.   Use the voltaren gel up to 4x daily.   Recheck with me prior to your trip to [place] if not doing well.   I think the main cause of the pain is because of arthritis.   The baker cyst is there but I do not think that is the main reason you are hurting.

## 2020-04-04 ENCOUNTER — Telehealth: Payer: Self-pay | Admitting: Family Medicine

## 2020-04-04 DIAGNOSIS — M11261 Other chondrocalcinosis, right knee: Secondary | ICD-10-CM | POA: Insufficient documentation

## 2020-04-04 MED ORDER — COLCHICINE 0.6 MG PO TABS: 0.6000 mg | ORAL_TABLET | Freq: Every day | ORAL | 3 refills | Status: DC | PRN

## 2020-04-04 NOTE — Progress Notes (Signed)
X-ray knee shows arthritis and evidence of pseudogout.  Pseudogout is a problem where crystals can form the joint and irritate the joint lining.  I will prescribe a medicine called colchicine that you can take as needed daily for knee pain and swelling.  This should help quite a bit in the future if needed.

## 2020-04-04 NOTE — Telephone Encounter (Signed)
Result note x-ray shows chondrocalcinosis.  Presume pseudogout as cause of pain. Colchicine prescribed as noted in result note.

## 2020-04-17 ENCOUNTER — Telehealth: Payer: Self-pay | Admitting: Internal Medicine

## 2020-04-17 MED ORDER — DULOXETINE HCL 60 MG PO CPEP: 60.0000 mg | ORAL_CAPSULE | Freq: Every day | ORAL | 3 refills | Status: DC

## 2020-04-17 MED ORDER — CYCLOBENZAPRINE HCL 5 MG PO TABS: 5.0000 mg | ORAL_TABLET | Freq: Three times a day (TID) | ORAL | 5 refills | Status: DC | PRN

## 2020-04-17 MED ORDER — LORAZEPAM 1 MG PO TABS: 1.0000 mg | ORAL_TABLET | Freq: Every evening | ORAL | 2 refills | Status: DC | PRN

## 2020-04-17 NOTE — Telephone Encounter (Signed)
Sent in all 3

## 2020-04-17 NOTE — Telephone Encounter (Signed)
    1.Medication Requested: cyclobenzaprine (FLEXERIL) 5 MG tablet DULoxetine (CYMBALTA) 60 MG capsule LORazepam (ATIVAN) 1 MG tablet   2. Pharmacy (Name, Street, [place]): [facility], fax [phone]  3. On Med List: yes  4. Last Visit with PCP: 01/08/20  5. Next visit date with PCP:12/03/20  Agent: Please be advised that RX refills may take up to 3 business days. We ask that you follow-up with your pharmacy.

## 2020-04-17 NOTE — Telephone Encounter (Signed)
Check [place] registry last filled Lorazepam 03/21/2020,,,/lmb

## 2020-04-28 ENCOUNTER — Ambulatory Visit (INDEPENDENT_AMBULATORY_CARE_PROVIDER_SITE_OTHER): Payer: Medicare Other | Admitting: Family Medicine

## 2020-04-28 ENCOUNTER — Other Ambulatory Visit: Payer: Self-pay

## 2020-04-28 ENCOUNTER — Encounter: Payer: Self-pay | Admitting: Family Medicine

## 2020-04-28 ENCOUNTER — Ambulatory Visit: Payer: Self-pay

## 2020-04-28 VITALS — BP 148/88 | HR 78 | Ht 61.0 in | Wt 128.4 lb

## 2020-04-28 DIAGNOSIS — M11261 Other chondrocalcinosis, right knee: Secondary | ICD-10-CM | POA: Diagnosis not present

## 2020-04-28 DIAGNOSIS — M25561 Pain in right knee: Secondary | ICD-10-CM

## 2020-04-28 DIAGNOSIS — M1711 Unilateral primary osteoarthritis, right knee: Secondary | ICD-10-CM | POA: Diagnosis not present

## 2020-04-28 NOTE — Patient Instructions (Signed)
Thank you for coming in today. Ok to advance activity as tolerated.  Stay active as you can.  Walking is ok as is exercise bike or water aerobics or swimming.  Recheck with me at any time.  If you have a flair up restart colchicine.  Try taking 1/2 pill either daily or twice daily.  Keep me updated if you have a problem.    Calcium Pyrophosphate Deposition Calcium pyrophosphate deposition (CPPD) is a type of arthritis that causes pain, swelling, and inflammation in a joint. Attacks of CPPD may come and go. The joint pain can be severe and may last for days to weeks. This condition usually affects one joint at a time. The knees are most often affected, but this condition can also affect the wrists, elbows, shoulders, or ankles. CPPD may also be called pseudogout because it is similar to gout. Both conditions result from the buildup of crystals in a joint. However, CPPD is caused by a type of crystal that is different from the crystals that cause gout. What are the causes? This condition is caused by the buildup of calcium pyrophosphate dihydrate crystals in a joint. The reason why this buildup occurs is not known. An increased likelihood of having this condition (predisposition) may be passed from parent to child (is hereditary). What increases the risk? This condition is more likely to develop in people who:  Are older than 73 years of age.  Have a family history of CPPD.  Have certain medical conditions, such as hemophilia, amyloidosis, or overactive parathyroid glands.  Have low levels of magnesium in the blood. What are the signs or symptoms? Symptoms of this condition include:  Joint pain. The pain may: ? Be intense and constant. ? Develop quickly. ? Get worse with movement. ? Last from several days to a few weeks.  Redness, swelling, stiffness, and warmth at the joint. How is this diagnosed? To diagnose this condition, your health care provider will use a needle to remove  fluid from the joint. The fluid will be examined for the crystals that cause CPPD. You also may have additional tests, such as:  Blood tests.  X-rays.  Ultrasound.  MRI. How is this treated? There is no way to remove the crystals from the joint and no cure for this condition. However, treatment can relieve symptoms and improve joint function. Treatment may include:  NSAIDs to reduce inflammation and pain.  Removing some of the fluid and crystals from around the joint with a needle.  Injections of medicine (cortisone) into the joint to reduce pain and swelling.  Medicines to help prevent attacks.  Physical therapy to improve joint function. Follow these instructions at home: Managing pain, stiffness, and swelling   Rest the affected joint until your symptoms start to go away.  If directed, put ice on the affected area to relieve pain and swelling: ? Put ice in a plastic bag. ? Place a towel between your skin and the bag. ? Leave the ice on for 20 minutes, 2-3 times a day.  Keep your affected joint raised (elevated) above the level of your heart, when possible. This will help to reduce swelling. For example, prop your foot up on a chair while sitting down to elevate your knee. General instructions  If the painful joint is in your leg, use crutches as told by your health care provider.  Take over-the-counter and prescription medicines only as told by your health care provider.  When your symptoms start to go away, begin  to exercise regularly or do physical therapy. Talk with your health care provider or physical therapist about what types of exercise are safe for you. Exercise that is easier on your joints (low-impact exercise) may be best. This includes walking, swimming, bicycling, and water aerobics.  Maintain a healthy weight. Excess weight puts stress on your joints.  Keep all follow-up visits as told by your health care provider and physical therapist. This is  important. Contact a health care provider if you:  Notice that your symptoms get worse.  Develop a skin rash.  Notice that your pain gets worse. Get help right away if you:  Have a fever.  Have difficulty breathing.  Are taking NSAIDs and you: ? Vomit blood. ? Have blood in your stool. ? Have stool that is tarry and black. Summary  Calcium pyrophosphate deposition (CPPD) is a type of arthritis that causes pain, swelling, and inflammation in a joint. The knees are most often affected, but it can also affect the wrists, elbows, shoulders, or ankles.  CPPD is caused by the buildup of calcium crystals in a joint. The reason why this occurs is not known.  Attacks of CPPD may come and go. The joint pain can be severe and may last for days to weeks.  There is no way to remove the crystals from the joint and no cure for this condition. However, treatment can relieve symptoms and improve joint function.  Rest the affected joint until your symptoms start to go away. After your symptoms go away, begin to exercise regularly or do physical therapy. This information is not intended to replace advice given to you by your health care provider. Make sure you discuss any questions you have with your health care provider. Document Revised: 11/25/2017 Document Reviewed: 10/11/2017 Elsevier Patient Education  [place].

## 2020-04-28 NOTE — Progress Notes (Signed)
   I, Wendy Poet, LAT, ATC, am serving as scribe for Dr. Lynne Leader.  Deborah Barton is a 74 y.o. female who presents to [place] at [facility] today for f/u of R knee pain.  She was last seen by Dr. Georgina Snell on 04/03/20 and had a R knee injection.  She had a R LE venous doppler on 04/02/20 that was neg for DVT but did show a Baker's cyst.  Since her last visit, pt reports that her R knee is feeling much better and she feels like the injection helped.  She states that the cholchicine is "strong" and has only taken it a few times.  She is tried taking a half a pill at a time which she tolerated better.  She notes that she is about 80% improved.  Diagnostic imaging: R knee XR- 04/03/20; R LE venous doppler- 04/02/20  Pertinent review of systems: No fevers or chills  Relevant historical information: Hypertension.  Patient will be traveling to [place] for about a month leaving this week.   Exam:  BP (!) 148/88 (BP Location: Left Arm, Patient Position: Sitting, Cuff Size: Normal)   Pulse 78   Ht 5\' 1"  (1.549 m)   Wt 128 lb 6.4 oz (58.2 kg)   SpO2 99%   BMI 24.26 kg/m  General: Well Developed, well nourished, and in no acute distress.   MSK: Right knee normal-appearing nontender normal motion.     Assessment and Plan: 74 y.o. female with right knee pain significantly better after injection and colchicine.  X-ray showed arthritis as well as chondrocalcinosis.  Presume that part of her symptom onset was due to pseudogout.  Discussed that she can try taking lower doses of colchicine if she needs to in the future however I am optimistic that she will do well.  Recheck back with me as needed.  If she is having a problem in [place] not relieved with colchicine happy to send in a course of oral prednisone.    Discussed warning signs or symptoms. Please see discharge instructions. Patient expresses understanding.   The above documentation has been reviewed and is accurate  and complete Lynne Leader

## 2020-08-18 DIAGNOSIS — H40023 Open angle with borderline findings, high risk, bilateral: Secondary | ICD-10-CM | POA: Diagnosis not present

## 2020-09-08 ENCOUNTER — Encounter: Payer: Self-pay | Admitting: Obstetrics & Gynecology

## 2020-09-08 ENCOUNTER — Ambulatory Visit: Payer: Medicare Other | Admitting: Obstetrics & Gynecology

## 2020-09-08 ENCOUNTER — Other Ambulatory Visit: Payer: Self-pay

## 2020-09-08 VITALS — BP 110/70

## 2020-09-08 DIAGNOSIS — B9689 Other specified bacterial agents as the cause of diseases classified elsewhere: Secondary | ICD-10-CM

## 2020-09-08 DIAGNOSIS — N760 Acute vaginitis: Secondary | ICD-10-CM | POA: Diagnosis not present

## 2020-09-08 DIAGNOSIS — N898 Other specified noninflammatory disorders of vagina: Secondary | ICD-10-CM | POA: Diagnosis not present

## 2020-09-08 LAB — WET PREP FOR TRICH, YEAST, CLUE

## 2020-09-08 MED ORDER — TINIDAZOLE 500 MG PO TABS: 1000.0000 mg | ORAL_TABLET | Freq: Two times a day (BID) | ORAL | 0 refills | Status: AC

## 2020-09-08 NOTE — Progress Notes (Signed)
    Deborah Barton [birth year] [id number]        73 y.o.  G2P2L2 Engaged x 10 yrs.  RP: Vaginal discharge and irritation  HPI: Vaginal discharge and irritation x about 4 weeks.  Tried Vagisil without improvement.  S/P TAH in 1994.  Postmenopause, well on no HRT.  No PMB.  No pelvic pain.  No pain with IC, last IC 3 weeks ago, using condoms.  Using Replens.   Urine normal, no leakage.  BMs normal.   OB History  Gravida Para Term Preterm AB Living  2 2       2   SAB TAB Ectopic Multiple Live Births               # Outcome Date GA Lbr Len/2nd Weight Sex Delivery Anes PTL Lv  2 Para           1 Para             Past medical history,surgical history, problem list, medications, allergies, family history and social history were all reviewed and documented in the EPIC chart.   Directed ROS with pertinent positives and negatives documented in the history of present illness/assessment and plan.  Exam:  Vitals:   09/08/20 1020  BP: 110/70   General appearance:  Normal  Gynecologic exam: Vulva normal.  Speculum:  Vagina normal.  Mild vaginal d/c.  Wet prep done.  Wet prep:  Clue cells present   Assessment/Plan:  74 y.o. G2P2   1. Vaginal discharge Vaginal discharge and vulvar irritation.  Bacterial vaginosis confirmed by wet prep.  Will treat with tinidazole.  Usage reviewed and prescription sent to pharmacy. - WET PREP FOR TRICH, YEAST, CLUE  2. Bacterial vaginosis As above.  Other orders - tinidazole (TINDAMAX) 500 MG tablet; Take 2 tablets (1,000 mg total) by mouth 2 (two) times daily for 2 days.  Princess Bruins MD, 10:26 AM 09/08/2020

## 2020-10-15 ENCOUNTER — Ambulatory Visit: Payer: Medicare Other | Attending: Family

## 2020-10-15 DIAGNOSIS — Z23 Encounter for immunization: Secondary | ICD-10-CM

## 2020-11-04 ENCOUNTER — Other Ambulatory Visit: Payer: Self-pay

## 2020-11-04 ENCOUNTER — Encounter: Payer: Self-pay | Admitting: Nurse Practitioner

## 2020-11-04 ENCOUNTER — Ambulatory Visit: Payer: Medicare Other | Admitting: Nurse Practitioner

## 2020-11-04 VITALS — BP 112/66

## 2020-11-04 DIAGNOSIS — B373 Candidiasis of vulva and vagina: Secondary | ICD-10-CM

## 2020-11-04 DIAGNOSIS — R300 Dysuria: Secondary | ICD-10-CM | POA: Diagnosis not present

## 2020-11-04 DIAGNOSIS — N898 Other specified noninflammatory disorders of vagina: Secondary | ICD-10-CM

## 2020-11-04 DIAGNOSIS — K610 Anal abscess: Secondary | ICD-10-CM

## 2020-11-04 DIAGNOSIS — B3731 Acute candidiasis of vulva and vagina: Secondary | ICD-10-CM

## 2020-11-04 MED ORDER — FLUCONAZOLE 150 MG PO TABS: 150.0000 mg | ORAL_TABLET | ORAL | 0 refills | Status: DC

## 2020-11-04 NOTE — Patient Instructions (Signed)
Vaginal Yeast Infection, Adult  Vaginal yeast infection is a condition that causes vaginal discharge as well as soreness, swelling, and redness (inflammation) of the vagina. This is a common condition. Some women get this infection frequently. What are the causes? This condition is caused by a change in the normal balance of the yeast (candida) and bacteria that live in the vagina. This change causes an overgrowth of yeast, which causes the inflammation. What increases the risk? The condition is more likely to develop in women who:  Take antibiotic medicines.  Have diabetes.  Take birth control pills.  Are pregnant.  Douche often.  Have a weak body defense system (immune system).  Have been taking steroid medicines for a long time.  Frequently wear tight clothing. What are the signs or symptoms? Symptoms of this condition include:  White, thick, creamy vaginal discharge.  Swelling, itching, redness, and irritation of the vagina. The lips of the vagina (vulva) may be affected as well.  Pain or a burning feeling while urinating.  Pain during sex. How is this diagnosed? This condition is diagnosed based on:  Your medical history.  A physical exam.  A pelvic exam. Your health care provider will examine a sample of your vaginal discharge under a microscope. Your health care provider may send this sample for testing to confirm the diagnosis. How is this treated? This condition is treated with medicine. Medicines may be over-the-counter or prescription. You may be told to use one or more of the following:  Medicine that is taken by mouth (orally).  Medicine that is applied as a cream (topically).  Medicine that is inserted directly into the vagina (suppository). Follow these instructions at home:  Lifestyle  Do not have sex until your health care provider approves. Tell your sex partner that you have a yeast infection. That person should go to his or her health care  provider and ask if they should also be treated.  Do not wear tight clothes, such as pantyhose or tight pants.  Wear breathable cotton underwear. General instructions  Take or apply over-the-counter and prescription medicines only as told by your health care provider.  Eat more yogurt. This may help to keep your yeast infection from returning.  Do not use tampons until your health care provider approves.  Try taking a sitz bath to help with discomfort. This is a warm water bath that is taken while you are sitting down. The water should only come up to your hips and should cover your buttocks. Do this 3-4 times per day or as told by your health care provider.  Do not douche.  If you have diabetes, keep your blood sugar levels under control.  Keep all follow-up visits as told by your health care provider. This is important. Contact a health care provider if:  You have a fever.  Your symptoms go away and then return.  Your symptoms do not get better with treatment.  Your symptoms get worse.  You have new symptoms.  You develop blisters in or around your vagina.  You have blood coming from your vagina and it is not your menstrual period.  You develop pain in your abdomen. Summary  Vaginal yeast infection is a condition that causes discharge as well as soreness, swelling, and redness (inflammation) of the vagina.  This condition is treated with medicine. Medicines may be over-the-counter or prescription.  Take or apply over-the-counter and prescription medicines only as told by your health care provider.  Do not douche.   Do not have sex or use tampons until your health care provider approves.  Contact a health care provider if your symptoms do not get better with treatment or your symptoms go away and then return. This information is not intended to replace advice given to you by your health care provider. Make sure you discuss any questions you have with your health care  provider. Document Revised: 07/13/2019 Document Reviewed: 05/01/2018 Elsevier Patient Education  2020 [employer]  

## 2020-11-04 NOTE — Progress Notes (Signed)
   Acute Office Visit  Subjective:    Patient ID: Deborah Barton, female    DOB: Dec 10, 1946, 74 y.o.   MRN: [id number]   HPI 74 y.o. presents today for vaginal irritation that has been ongoing for a few months. She has noticed an increase in clear discharge. Denies odor. She was treated for BV 09/08/2020 with Tinidazole and says symptoms improved for some time but irritation returned. She has some burning with urination but thinks it may be from vaginal irritation. She also complains of anal boil that has been present for 2-3 weeks that has decrease some in size. Denies redness, drainage, or warmth.    Review of Systems  Constitutional: Negative.   Gastrointestinal:       Boil on anus  Genitourinary: Positive for dysuria, vaginal discharge and vaginal pain (irritation). Negative for frequency, hematuria and urgency.       Objective:    Physical Exam Constitutional:      Appearance: Normal appearance.  Genitourinary:    General: Normal vulva.     Vagina: Vaginal discharge (thick, white) present.     Uterus: Absent.        BP 112/66  Wt Readings from Last 3 Encounters:  04/28/20 128 lb 6.4 oz (58.2 kg)  04/03/20 130 lb 3.2 oz (59.1 kg)  12/22/19 132 lb (59.9 kg)   UA negative Wet prep + yeast     Assessment & Plan:   Problem List Items Addressed This Visit    None    Visit Diagnoses    Vaginal candidiasis    -  Primary   Relevant Medications   fluconazole (DIFLUCAN) 150 MG tablet   Vaginal irritation       Relevant Orders   WET PREP FOR TRICH, YEAST, CLUE   Burning with urination       Relevant Orders   Urinalysis w microscopic + reflex cultur   Boil, anus       Relevant Medications   fluconazole (DIFLUCAN) 150 MG tablet      Plan: Wet prep positive for yeast. Diflucan 150 mg tablet today and repeat in 3 days for total of 2 doses. UA unremarkable and burning with urination most likely from vaginal irritation. For boil, I recommended warm compresses  and avoiding vigorous rubbing and reassured that many times these resolve slowly on their own. If it becomes larger or begins showing signs of infection such as redness, drainage, or swelling she will return to office. She is agreeable to plan.     Tamela Gammon [facility], 12:58 PM 11/04/2020

## 2020-11-05 ENCOUNTER — Other Ambulatory Visit: Payer: Self-pay | Admitting: Nurse Practitioner

## 2020-11-05 DIAGNOSIS — B3731 Acute candidiasis of vulva and vagina: Secondary | ICD-10-CM

## 2020-11-05 DIAGNOSIS — B373 Candidiasis of vulva and vagina: Secondary | ICD-10-CM

## 2020-11-05 LAB — URINALYSIS W MICROSCOPIC + REFLEX CULTURE
Bacteria, UA: NONE SEEN /HPF
Bilirubin Urine: NEGATIVE
Glucose, UA: NEGATIVE
Hgb urine dipstick: NEGATIVE
Hyaline Cast: NONE SEEN /LPF
Leukocyte Esterase: NEGATIVE
Nitrites, Initial: NEGATIVE
Protein, ur: NEGATIVE
RBC / HPF: NONE SEEN /HPF (ref 0–2)
Specific Gravity, Urine: 1.025 (ref 1.001–1.03)
WBC, UA: NONE SEEN /HPF (ref 0–5)
pH: 5.5 (ref 5.0–8.0)

## 2020-11-05 LAB — WET PREP FOR TRICH, YEAST, CLUE

## 2020-11-05 LAB — NO CULTURE INDICATED

## 2020-11-05 NOTE — Telephone Encounter (Signed)
Deborah Barton patient was seen yesterday and prescribed #2 tablets, patient 1 of pills fell on the floor in her bedroom and she can find it. She asked if you would approve 1 pill again to replace the lost pill?

## 2020-11-13 DIAGNOSIS — H524 Presbyopia: Secondary | ICD-10-CM | POA: Diagnosis not present

## 2020-11-13 DIAGNOSIS — H35362 Drusen (degenerative) of macula, left eye: Secondary | ICD-10-CM | POA: Diagnosis not present

## 2020-11-13 DIAGNOSIS — H40023 Open angle with borderline findings, high risk, bilateral: Secondary | ICD-10-CM | POA: Diagnosis not present

## 2020-11-13 DIAGNOSIS — H26491 Other secondary cataract, right eye: Secondary | ICD-10-CM | POA: Diagnosis not present

## 2020-11-13 DIAGNOSIS — H35033 Hypertensive retinopathy, bilateral: Secondary | ICD-10-CM | POA: Diagnosis not present

## 2020-12-03 ENCOUNTER — Other Ambulatory Visit: Payer: Self-pay

## 2020-12-03 ENCOUNTER — Telehealth: Payer: Self-pay | Admitting: Family

## 2020-12-03 ENCOUNTER — Ambulatory Visit (INDEPENDENT_AMBULATORY_CARE_PROVIDER_SITE_OTHER): Payer: Medicare Other | Admitting: Internal Medicine

## 2020-12-03 ENCOUNTER — Encounter: Payer: Self-pay | Admitting: Internal Medicine

## 2020-12-03 VITALS — BP 124/72 | HR 86 | Resp 18 | Ht 61.0 in | Wt 130.4 lb

## 2020-12-03 DIAGNOSIS — Z0000 Encounter for general adult medical examination without abnormal findings: Secondary | ICD-10-CM

## 2020-12-03 DIAGNOSIS — I10 Essential (primary) hypertension: Secondary | ICD-10-CM | POA: Diagnosis not present

## 2020-12-03 DIAGNOSIS — Z860100 Personal history of colon polyps, unspecified: Secondary | ICD-10-CM

## 2020-12-03 DIAGNOSIS — G609 Hereditary and idiopathic neuropathy, unspecified: Secondary | ICD-10-CM

## 2020-12-03 DIAGNOSIS — Z23 Encounter for immunization: Secondary | ICD-10-CM | POA: Diagnosis not present

## 2020-12-03 DIAGNOSIS — F411 Generalized anxiety disorder: Secondary | ICD-10-CM

## 2020-12-03 DIAGNOSIS — H9319 Tinnitus, unspecified ear: Secondary | ICD-10-CM

## 2020-12-03 LAB — CBC
HCT: 36.2 % (ref 36.0–46.0)
Hemoglobin: 11.5 g/dL — ABNORMAL LOW (ref 12.0–15.0)
MCHC: 31.8 g/dL (ref 30.0–36.0)
MCV: 72.5 fl — ABNORMAL LOW (ref 78.0–100.0)
Platelets: 285 10*3/uL (ref 150.0–400.0)
RBC: 4.99 Mil/uL (ref 3.87–5.11)
RDW: 15.7 % — ABNORMAL HIGH (ref 11.5–15.5)
WBC: 6.1 10*3/uL (ref 4.0–10.5)

## 2020-12-03 LAB — COMPREHENSIVE METABOLIC PANEL
ALT: 16 U/L (ref 0–35)
AST: 26 U/L (ref 0–37)
Albumin: 4.4 g/dL (ref 3.5–5.2)
Alkaline Phosphatase: 84 U/L (ref 39–117)
BUN: 14 mg/dL (ref 6–23)
CO2: 30 mEq/L (ref 19–32)
Calcium: 10.1 mg/dL (ref 8.4–10.5)
Chloride: 102 mEq/L (ref 96–112)
Creatinine, Ser: 0.89 mg/dL (ref 0.40–1.20)
GFR: 64.51 mL/min (ref >60.00)
Glucose, Bld: 73 mg/dL (ref 70–99)
Potassium: 4.2 mEq/L (ref 3.5–5.1)
Sodium: 139 mEq/L (ref 135–145)
Total Bilirubin: 0.6 mg/dL (ref 0.2–1.2)
Total Protein: 7.6 g/dL (ref 6.0–8.3)

## 2020-12-03 LAB — LIPID PANEL
Cholesterol: 201 mg/dL — ABNORMAL HIGH (ref 0–200)
HDL: 67.2 mg/dL (ref >39.00)
LDL Cholesterol: 122 mg/dL — ABNORMAL HIGH (ref 0–99)
NonHDL: 133.62
Total CHOL/HDL Ratio: 3
Triglycerides: 59 mg/dL (ref 0.0–149.0)
VLDL: 11.8 mg/dL (ref 0.0–40.0)

## 2020-12-03 MED ORDER — LORAZEPAM 1 MG PO TABS: 1.0000 mg | ORAL_TABLET | Freq: Every evening | ORAL | 2 refills | Status: DC | PRN

## 2020-12-03 MED ORDER — FLUTICASONE PROPIONATE 50 MCG/ACT NA SUSP: 2.0000 | Freq: Every day | NASAL | 6 refills | Status: DC

## 2020-12-03 MED ORDER — ZOSTER VAC RECOMB ADJUVANTED 50 MCG/0.5ML IM SUSR: 0.5000 mL | Freq: Once | INTRAMUSCULAR | 1 refills | Status: AC

## 2020-12-03 NOTE — Assessment & Plan Note (Signed)
Stable with cymbalta 60 mg daily.

## 2020-12-03 NOTE — Assessment & Plan Note (Signed)
Refill lorazepam used for sleep and anxiety prn.

## 2020-12-03 NOTE — Assessment & Plan Note (Signed)
Flu shot given. Covid-19 up to date including booster. Pneumonia complete. Shingrix given rx today. Tetanus due 2030. Colonoscopy due referral to GI placed. Mammogram due 2023, pap smear aged out and dexa due 2025. Counseled about sun safety and mole surveillance. Counseled about the dangers of distracted driving. Given 10 year screening recommendations.

## 2020-12-03 NOTE — Patient Instructions (Signed)
Health Maintenance, Female Adopting a healthy lifestyle and getting preventive care are important in promoting health and wellness. Ask your health care provider about:  The right schedule for you to have regular tests and exams.  Things you can do on your own to prevent diseases and keep yourself healthy. What should I know about diet, weight, and exercise? Eat a healthy diet   Eat a diet that includes plenty of vegetables, fruits, low-fat dairy products, and lean protein.  Do not eat a lot of foods that are high in solid fats, added sugars, or sodium. Maintain a healthy weight Body mass index (BMI) is used to identify weight problems. It estimates body fat based on height and weight. Your health care provider can help determine your BMI and help you achieve or maintain a healthy weight. Get regular exercise Get regular exercise. This is one of the most important things you can do for your health. Most adults should:  Exercise for at least 150 minutes each week. The exercise should increase your heart rate and make you sweat (moderate-intensity exercise).  Do strengthening exercises at least twice a week. This is in addition to the moderate-intensity exercise.  Spend less time sitting. Even light physical activity can be beneficial. Watch cholesterol and blood lipids Have your blood tested for lipids and cholesterol at 73 years of age, then have this test every 5 years. Have your cholesterol levels checked more often if:  Your lipid or cholesterol levels are high.  You are older than 73 years of age.  You are at high risk for heart disease. What should I know about cancer screening? Depending on your health history and family history, you may need to have cancer screening at various ages. This may include screening for:  Breast cancer.  Cervical cancer.  Colorectal cancer.  Skin cancer.  Lung cancer. What should I know about heart disease, diabetes, and high blood  pressure? Blood pressure and heart disease  High blood pressure causes heart disease and increases the risk of stroke. This is more likely to develop in people who have high blood pressure readings, are of African descent, or are overweight.  Have your blood pressure checked: ? Every 3-5 years if you are 18-39 years of age. ? Every year if you are 40 years old or older. Diabetes Have regular diabetes screenings. This checks your fasting blood sugar level. Have the screening done:  Once every three years after age 40 if you are at a normal weight and have a low risk for diabetes.  More often and at a younger age if you are overweight or have a high risk for diabetes. What should I know about preventing infection? Hepatitis B If you have a higher risk for hepatitis B, you should be screened for this virus. Talk with your health care provider to find out if you are at risk for hepatitis B infection. Hepatitis C Testing is recommended for:  Everyone born from 1945 through 1965.  Anyone with known risk factors for hepatitis C. Sexually transmitted infections (STIs)  Get screened for STIs, including gonorrhea and chlamydia, if: ? You are sexually active and are younger than 73 years of age. ? You are older than 73 years of age and your health care provider tells you that you are at risk for this type of infection. ? Your sexual activity has changed since you were last screened, and you are at increased risk for chlamydia or gonorrhea. Ask your health care provider if   you are at risk.  Ask your health care provider about whether you are at high risk for HIV. Your health care provider may recommend a prescription medicine to help prevent HIV infection. If you choose to take medicine to prevent HIV, you should first get tested for HIV. You should then be tested every 3 months for as long as you are taking the medicine. Pregnancy  If you are about to stop having your period (premenopausal) and  you may become pregnant, seek counseling before you get pregnant.  Take 400 to 800 micrograms (mcg) of folic acid every day if you become pregnant.  Ask for birth control (contraception) if you want to prevent pregnancy. Osteoporosis and menopause Osteoporosis is a disease in which the bones lose minerals and strength with aging. This can result in bone fractures. If you are 65 years old or older, or if you are at risk for osteoporosis and fractures, ask your health care provider if you should:  Be screened for bone loss.  Take a calcium or vitamin D supplement to lower your risk of fractures.  Be given hormone replacement therapy (HRT) to treat symptoms of menopause. Follow these instructions at home: Lifestyle  Do not use any products that contain nicotine or tobacco, such as cigarettes, e-cigarettes, and chewing tobacco. If you need help quitting, ask your health care provider.  Do not use street drugs.  Do not share needles.  Ask your health care provider for help if you need support or information about quitting drugs. Alcohol use  Do not drink alcohol if: ? Your health care provider tells you not to drink. ? You are pregnant, may be pregnant, or are planning to become pregnant.  If you drink alcohol: ? Limit how much you use to 0-1 drink a day. ? Limit intake if you are breastfeeding.  Be aware of how much alcohol is in your drink. In the [place] one drink equals one 12 oz bottle of beer (355 mL), one 5 oz glass of wine (148 mL), or one 1 oz glass of hard liquor (44 mL). General instructions  Schedule regular health, dental, and eye exams.  Stay current with your vaccines.  Tell your health care provider if: ? You often feel depressed. ? You have ever been abused or do not feel safe at home. Summary  Adopting a healthy lifestyle and getting preventive care are important in promoting health and wellness.  Follow your health care provider's instructions about healthy  diet, exercising, and getting tested or screened for diseases.  Follow your health care provider's instructions on monitoring your cholesterol and blood pressure. This information is not intended to replace advice given to you by your health care provider. Make sure you discuss any questions you have with your health care provider. Document Revised: 12/06/2018 Document Reviewed: 12/06/2018 Elsevier Patient Education  2020 Elsevier Inc.  

## 2020-12-03 NOTE — Assessment & Plan Note (Signed)
BP at goal off meds currently with exercise and diet. Checking CMP and adjust as needed.

## 2020-12-03 NOTE — Telephone Encounter (Signed)
Patient came back upstairs after her physical with concerns for "thumping" in her left ear; notes she forgot to discuss with PCP at time of appointment.  I checked ears and no obvious abnormality noted; recommended trial of Flonase and asked for referral to ENT which I have updated.  I told her I would let you know.

## 2020-12-03 NOTE — Progress Notes (Signed)
   Subjective:   Patient ID: Deborah Barton, female    DOB: [birth year], 74 y.o.   MRN: [id number]  HPI The patient is a 74 YO female coming in for physical.   PMH, [place], social history reviewed and updated  Review of Systems  Constitutional: Negative.   HENT: Positive for tinnitus.   Eyes: Negative.   Respiratory: Negative for cough, chest tightness and shortness of breath.   Cardiovascular: Negative for chest pain, palpitations and leg swelling.  Gastrointestinal: Negative for abdominal distention, abdominal pain, constipation, diarrhea, nausea and vomiting.  Musculoskeletal: Negative.   Skin: Negative.   Neurological: Negative.   Psychiatric/Behavioral: Negative.     Objective:  Physical Exam Constitutional:      Appearance: She is well-developed.  HENT:     Head: Normocephalic and atraumatic.  Cardiovascular:     Rate and Rhythm: Normal rate and regular rhythm.  Pulmonary:     Effort: Pulmonary effort is normal. No respiratory distress.     Breath sounds: Normal breath sounds. No wheezing or rales.  Abdominal:     General: Bowel sounds are normal. There is no distension.     Palpations: Abdomen is soft.     Tenderness: There is no abdominal tenderness. There is no rebound.  Musculoskeletal:     Cervical back: Normal range of motion.  Skin:    General: Skin is warm and dry.  Neurological:     Mental Status: She is alert and oriented to person, place, and time.     Coordination: Coordination normal.     Vitals:   12/03/20 1043  BP: 124/72  Pulse: 86  Resp: 18  SpO2: 97%  Weight: 130 lb 6.4 oz (59.1 kg)  Height: 5\' 1"  (1.549 m)   This visit occurred during the SARS-CoV-2 public health emergency.  Safety protocols were in place, including screening questions prior to the visit, additional usage of staff PPE, and extensive cleaning of exam room while observing appropriate contact time as indicated for disinfecting solutions.   Assessment & Plan:  Flu  shot given at visit

## 2020-12-11 ENCOUNTER — Ambulatory Visit (INDEPENDENT_AMBULATORY_CARE_PROVIDER_SITE_OTHER): Payer: Medicare Other | Admitting: Nurse Practitioner

## 2020-12-11 ENCOUNTER — Other Ambulatory Visit: Payer: Self-pay

## 2020-12-11 ENCOUNTER — Encounter: Payer: Self-pay | Admitting: Nurse Practitioner

## 2020-12-11 VITALS — BP 110/80 | Ht 61.0 in | Wt 133.0 lb

## 2020-12-11 DIAGNOSIS — B373 Candidiasis of vulva and vagina: Secondary | ICD-10-CM

## 2020-12-11 DIAGNOSIS — Z90710 Acquired absence of both cervix and uterus: Secondary | ICD-10-CM

## 2020-12-11 DIAGNOSIS — M8580 Other specified disorders of bone density and structure, unspecified site: Secondary | ICD-10-CM | POA: Diagnosis not present

## 2020-12-11 DIAGNOSIS — Z01419 Encounter for gynecological examination (general) (routine) without abnormal findings: Secondary | ICD-10-CM

## 2020-12-11 DIAGNOSIS — N898 Other specified noninflammatory disorders of vagina: Secondary | ICD-10-CM | POA: Diagnosis not present

## 2020-12-11 DIAGNOSIS — B3731 Acute candidiasis of vulva and vagina: Secondary | ICD-10-CM

## 2020-12-11 LAB — WET PREP FOR TRICH, YEAST, CLUE

## 2020-12-11 MED ORDER — FLUCONAZOLE 150 MG PO TABS: 150.0000 mg | ORAL_TABLET | ORAL | 0 refills | Status: DC

## 2020-12-11 NOTE — Progress Notes (Signed)
   Deborah Barton 1946/04/09 [id number]   History:  74 y.o. G2P2 presents for breast and pelvic exam. 1994 TAH for fibroids and endometriosis, no HRT. Normal pap and mammogram history. 2018 pap showed ASCUS negative HPV, subsequent pap in 2020 normal. Osteopenia, bone densities ordered through PCP. Complains of external vaginal itching without discharge and odor.   Gynecologic History No LMP recorded. Patient has had a hysterectomy.   Contraception: status post hysterectomy Last Pap: 11/12/2019. Results were: normal Last mammogram: 01/15/2020. Results were: normal Last colonoscopy: 2016. Results were: polyps, 3 year follow up Last Dexa: 12/04/2019. Results were: t-score -1.4, FRAX 6.3% / 3.0%  Past medical history, past surgical history, family history and social history were all reviewed and documented in the EPIC chart.  ROS:  A ROS was performed and pertinent positives and negatives are included.  Exam:  Vitals:   12/11/20 1103  BP: 110/80  Weight: 133 lb (60.3 kg)  Height: 5\' 1"  (1.549 m)   Body mass index is 25.13 kg/m.  General appearance:  Normal Thyroid:  Symmetrical, normal in size, without palpable masses or nodularity. Respiratory  Auscultation:  Clear without wheezing or rhonchi Cardiovascular  Auscultation:  Regular rate, without rubs, murmurs or gallops  Edema/varicosities:  Not grossly evident Abdominal  Soft,nontender, without masses, guarding or rebound.  Liver/spleen:  No organomegaly noted  Hernia:  None appreciated  Skin  Inspection:  Grossly normal   Breasts: Examined lying and sitting.   Right: Without masses, retractions, discharge or axillary adenopathy.   Left: Without masses, retractions, discharge or axillary adenopathy. Gentitourinary   Inguinal/mons:  Normal without inguinal adenopathy  External genitalia:  Normal  BUS/Urethra/Skene's glands:  Normal  Vagina:  Normal  Cervix:  Absent  Uterus:  Absent  Adnexa/parametria:      Rt: Without masses or tenderness.   Lt: Without masses or tenderness.  Anus and perineum: Normal  Digital rectal exam: Normal sphincter tone without palpated masses or tenderness  Wet prep + yeast  Assessment/Plan:  74 y.o. G2P2 for breast and pelvic exam.   Well female exam with routine gynecological exam - Education provided on SBEs, importance of preventative screenings, current guidelines, high calcium diet, regular exercise, and multivitamin daily. Labs with PCP.   Vaginal itching - Plan: WET PREP FOR TRICH, YEAST, CLUE  Vaginal candidiasis - Plan: fluconazole (DIFLUCAN) 150 MG tablet today and repeat in 3 days if symptoms persist for total of 2 doses. She applies cocoa butter lotion to groin so it is recommended she discontinue this due to recurrent infections. She already used fragrance-free dove soap.   History of total abdominal hysterectomy - 1994 for fibroids and endometriosis. No HRT.   Osteopenia, unspecified location. DEXA 11/2019 t-score -1.4. Managed by PCP. Recommend daily Vitamin D supplement and regular exercise that incorporates resistance training.   Screening for cervical cancer - No significant abnormal pap history. 2018 ASCUS negative HPV, subsequent pap normal. Will repeat at 3-year interval and consider stopping screenings.   Screening for breast cancer - Normal mammogram history. Continue annual screenings. Normal breast exam today.   Screening for colon cancer - 2016 screenings colonoscopy with 3-year recall. She is overdue due to pandemic but is scheduled soon.   Follow up in 1 year for annual.       Tamela Gammon [facility], 11:17 AM 12/11/2020

## 2020-12-11 NOTE — Patient Instructions (Signed)
Health Maintenance After Age 73 After age 73, you are at a higher risk for certain long-term diseases and infections as well as injuries from falls. Falls are a major cause of broken bones and head injuries in people who are older than age 73. Getting regular preventive care can help to keep you healthy and well. Preventive care includes getting regular testing and making lifestyle changes as recommended by your health care provider. Talk with your health care provider about:  Which screenings and tests you should have. A screening is a test that checks for a disease when you have no symptoms.  A diet and exercise plan that is right for you. What should I know about screenings and tests to prevent falls? Screening and testing are the best ways to find a health problem early. Early diagnosis and treatment give you the best chance of managing medical conditions that are common after age 73. Certain conditions and lifestyle choices may make you more likely to have a fall. Your health care provider may recommend:  Regular vision checks. Poor vision and conditions such as cataracts can make you more likely to have a fall. If you wear glasses, make sure to get your prescription updated if your vision changes.  Medicine review. Work with your health care provider to regularly review all of the medicines you are taking, including over-the-counter medicines. Ask your health care provider about any side effects that may make you more likely to have a fall. Tell your health care provider if any medicines that you take make you feel dizzy or sleepy.  Osteoporosis screening. Osteoporosis is a condition that causes the bones to get weaker. This can make the bones weak and cause them to break more easily.  Blood pressure screening. Blood pressure changes and medicines to control blood pressure can make you feel dizzy.  Strength and balance checks. Your health care provider may recommend certain tests to check your  strength and balance while standing, walking, or changing positions.  Foot health exam. Foot pain and numbness, as well as not wearing proper footwear, can make you more likely to have a fall.  Depression screening. You may be more likely to have a fall if you have a fear of falling, feel emotionally low, or feel unable to do activities that you used to do.  Alcohol use screening. Using too much alcohol can affect your balance and may make you more likely to have a fall. What actions can I take to lower my risk of falls? General instructions  Talk with your health care provider about your risks for falling. Tell your health care provider if: ? You fall. Be sure to tell your health care provider about all falls, even ones that seem minor. ? You feel dizzy, sleepy, or off-balance.  Take over-the-counter and prescription medicines only as told by your health care provider. These include any supplements.  Eat a healthy diet and maintain a healthy weight. A healthy diet includes low-fat dairy products, low-fat (lean) meats, and fiber from whole grains, beans, and lots of fruits and vegetables. Home safety  Remove any tripping hazards, such as rugs, cords, and clutter.  Install safety equipment such as grab bars in bathrooms and safety rails on stairs.  Keep rooms and walkways well-lit. Activity   Follow a regular exercise program to stay fit. This will help you maintain your balance. Ask your health care provider what types of exercise are appropriate for you.  If you need a cane or   walker, use it as recommended by your health care provider.  Wear supportive shoes that have nonskid soles. Lifestyle  Do not drink alcohol if your health care provider tells you not to drink.  If you drink alcohol, limit how much you have: ? 0-1 drink a day for women. ? 0-2 drinks a day for men.  Be aware of how much alcohol is in your drink. In the [place] one drink equals one typical bottle of beer (12  oz), one-half glass of wine (5 oz), or one shot of hard liquor (1 oz).  Do not use any products that contain nicotine or tobacco, such as cigarettes and e-cigarettes. If you need help quitting, ask your health care provider. Summary  Having a healthy lifestyle and getting preventive care can help to protect your health and wellness after age 73.  Screening and testing are the best way to find a health problem early and help you avoid having a fall. Early diagnosis and treatment give you the best chance for managing medical conditions that are more common for people who are older than age 73.  Falls are a major cause of broken bones and head injuries in people who are older than age 73. Take precautions to prevent a fall at home.  Work with your health care provider to learn what changes you can make to improve your health and wellness and to prevent falls. This information is not intended to replace advice given to you by your health care provider. Make sure you discuss any questions you have with your health care provider. Document Revised: 04/05/2019 Document Reviewed: 10/26/2017 Elsevier Patient Education  2020 Elsevier Inc.  

## 2020-12-18 ENCOUNTER — Telehealth: Payer: Self-pay | Admitting: Internal Medicine

## 2020-12-18 ENCOUNTER — Encounter: Payer: Self-pay | Admitting: Gastroenterology

## 2020-12-18 NOTE — Telephone Encounter (Signed)
Copied from [place] [id number]. Topic: Medicare AWV >> Dec 18, 2020 11:26 AM Cher Nakai R wrote: Reason for CRM:  Left message for patient to call back and schedule Medicare Annual Wellness Visit (AWV) in office.   If not able to come in office, please offer to do virtually.   Last AWV 12/03/2019  Please schedule at anytime with the Nurse Health Advisor.

## 2021-01-09 ENCOUNTER — Other Ambulatory Visit: Payer: Self-pay

## 2021-01-09 ENCOUNTER — Ambulatory Visit (AMBULATORY_SURGERY_CENTER): Payer: Self-pay

## 2021-01-09 VITALS — Ht 62.0 in | Wt 135.6 lb

## 2021-01-09 DIAGNOSIS — Z86010 Personal history of colonic polyps: Secondary | ICD-10-CM

## 2021-01-09 MED ORDER — SUTAB 1479-225-188 MG PO TABS: 1.0000 | ORAL_TABLET | ORAL | 0 refills | Status: DC

## 2021-01-09 NOTE — Progress Notes (Signed)
No egg or soy allergy known to patient  No issues with past sedation with any surgeries or procedures No intubation problems in the past  No FH of Malignant Hyperthermia No diet pills per patient No home 02 use per patient  No blood thinners per patient  Pt denies issues with constipation  No A fib or A flutter  COVID 19 guidelines implemented in [facility] today with Pt and RN  Pt is fully vaccinated  for Covid x 2 + booster Coupon given to pt in PV today , Code to Pharmacy  Due to the COVID-19 pandemic we are asking patients to follow certain guidelines.  Pt aware of COVID protocols and [facility] guidelines

## 2021-01-14 ENCOUNTER — Ambulatory Visit: Payer: Medicare Other | Admitting: Family Medicine

## 2021-01-14 ENCOUNTER — Other Ambulatory Visit: Payer: Self-pay

## 2021-01-14 ENCOUNTER — Ambulatory Visit: Payer: Self-pay

## 2021-01-14 VITALS — BP 118/82 | HR 93 | Ht 62.0 in | Wt 135.8 lb

## 2021-01-14 DIAGNOSIS — M25561 Pain in right knee: Secondary | ICD-10-CM

## 2021-01-14 DIAGNOSIS — M11261 Other chondrocalcinosis, right knee: Secondary | ICD-10-CM

## 2021-01-14 DIAGNOSIS — M1711 Unilateral primary osteoarthritis, right knee: Secondary | ICD-10-CM | POA: Diagnosis not present

## 2021-01-14 DIAGNOSIS — F40298 Other specified phobia: Secondary | ICD-10-CM

## 2021-01-14 MED ORDER — COLCHICINE 0.6 MG PO TABS
0.6000 mg | ORAL_TABLET | Freq: Every day | ORAL | 3 refills | Status: DC | PRN
Start: 2021-01-14 — End: 2024-01-04

## 2021-01-14 NOTE — Patient Instructions (Signed)
Thank you for coming in today.  Call or go to the ER if you develop a large red swollen joint with extreme pain or oozing puss.   Take the colchicine as needed for knee pain or swelling.   If the shot does not help let me know. I can try to remove the fluid from the baker cyst.  I can prescribe the numbing medicine for the skin ahead of time (Emla cream).   Please use voltaren gel up to 4x daily for pain as needed.

## 2021-01-14 NOTE — Progress Notes (Signed)
I, Peterson Lombard, LAT, ATC acting as a scribe for Deborah Leader, MD.  Deborah Barton is a 75 y.o. female who presents to [facility] at [facility] today for f/u R knee pain Pt was last seen by Dr. Georgina Barton on 04/28/20 and was advised to continue w/ colchicine and recheck back prn. Today, pt reports R knee pain is about the same, but has flared up recently. Pt locates pain to posterior and medial aspect of knee. Pt reports fear of Baker's cyst from what she's been reading online and state she feels like it's gotten bigger and can feel it bulging out. Pt reports pain has been ongoing since around Christmas.  Radiates: slightly onto medial shin Aggravates: too much activity, standing for long periods, shoes Rx tried:  Dx imaging: 04/03/20 R knee XR  04/02/20 R LE venous doppler  Pertinent review of systems: No fevers or chills  Relevant historical information: Hypertension, pseudogout, needle phobia   Exam:  BP 118/82 (BP Location: Right Arm, Patient Position: Sitting, Cuff Size: Normal)   Pulse 93   Ht 5\' 2"  (1.575 m)   Wt 135 lb 12.8 oz (61.6 kg)   SpO2 93%   BMI 24.84 kg/m  General: Well Developed, well nourished, and in no acute distress.   MSK: Right knee moderate effusion otherwise normal. Normal motion. Tender palpation medial joint line.  Nontender posterior knee although palpable Baker's cyst is present. Stable ligamentous exam.    Lab and Radiology Results  Procedure: Real-time Ultrasound Guided Injection of right knee superior lateral patellar space Device: Philips Affiniti 50G Images permanently stored and available for review in PACS Ultrasound inspection prior to injection reveals moderate effusion degenerative changes medial and lateral joint line with degenerative meniscus.  Large Baker's cyst is present.  Small pes anserine bursitis is present. Verbal informed consent obtained.  Discussed risks and benefits of procedure. Warned about infection  bleeding damage to structures skin hypopigmentation and fat atrophy among others. Patient expresses understanding and agreement Time-out conducted.   Noted no overlying erythema, induration, or other signs of local infection.   Skin prepped in a sterile fashion.   Local anesthesia: Topical Ethyl chloride.   With sterile technique and under real time ultrasound guidance:  40 mg of Kenalog and 2 mg of Marcaine injected into knee joint. Fluid seen entering the joint capsule.   Completed without difficulty   Pain immediately resolved suggesting accurate placement of the medication.   Advised to call if fevers/chills, erythema, induration, drainage, or persistent bleeding.   Images permanently stored and available for review in the ultrasound unit.  Impression: Technically successful ultrasound guided injection.      Assessment and Plan: 75 y.o. female with right knee pain and swelling due to degenerative changes, Baker's cyst, and pseudogout.  Patient did very well over the last 7 or 8 months with steroid injection and intermittent colchicine.  She is run out of colchicine and her pain has returned.  Discussed treatment options today.  Originally I was planning on aspirating and injecting the Baker's cyst however she is very needle phobic and we elected to just proceed with a regular steroid injection of the knee as this is less painful and safer and quicker.  Plan for steroid injection as above, Voltaren gel (at prescription doses), and colchicine.  If this is not enough to control her pain will consider trial of aspiration and injection of Baker's cyst.  I will prescribe Emla cream ahead of time that  she can apply to the back of the knee that will likely allow her to proceed with the injection and aspiration more safely.  Recheck back as needed.   PDMP not reviewed this encounter. Orders Placed This Encounter  Procedures  . [place] LIMITED JOINT SPACE STRUCTURES LOW RIGHT(NO LINKED CHARGES)     Standing Status:   Future    Number of Occurrences:   1    Standing Expiration Date:   07/14/2021    Order Specific Question:   Reason for Exam (SYMPTOM  OR DIAGNOSIS REQUIRED)    Answer:   right knee pain    Order Specific Question:   Preferred imaging location?    Answer:   [place]   Meds ordered this encounter  Medications  . colchicine 0.6 MG tablet    Sig: Take 1 tablet (0.6 mg total) by mouth daily as needed (knee pain and swelling due to pseudogout).    Dispense:  30 tablet    Refill:  3     Discussed warning signs or symptoms. Please see discharge instructions. Patient expresses understanding.   The above documentation has been reviewed and is accurate and complete Deborah Barton, M.D.

## 2021-01-16 ENCOUNTER — Encounter: Payer: Self-pay | Admitting: Gastroenterology

## 2021-01-20 ENCOUNTER — Encounter: Payer: Self-pay | Admitting: Family

## 2021-01-26 ENCOUNTER — Encounter: Payer: Medicare Other | Admitting: Gastroenterology

## 2021-01-30 ENCOUNTER — Encounter: Payer: Self-pay | Admitting: Internal Medicine

## 2021-01-30 ENCOUNTER — Other Ambulatory Visit: Payer: Self-pay

## 2021-01-30 ENCOUNTER — Ambulatory Visit (AMBULATORY_SURGERY_CENTER): Payer: Medicare Other | Admitting: Internal Medicine

## 2021-01-30 VITALS — BP 125/74 | HR 66 | Temp 98.0°F | Resp 17 | Ht 62.0 in | Wt 135.0 lb

## 2021-01-30 DIAGNOSIS — Z86010 Personal history of colonic polyps: Secondary | ICD-10-CM

## 2021-01-30 DIAGNOSIS — D122 Benign neoplasm of ascending colon: Secondary | ICD-10-CM | POA: Diagnosis not present

## 2021-01-30 DIAGNOSIS — D120 Benign neoplasm of cecum: Secondary | ICD-10-CM

## 2021-01-30 DIAGNOSIS — Z1211 Encounter for screening for malignant neoplasm of colon: Secondary | ICD-10-CM | POA: Diagnosis not present

## 2021-01-30 MED ORDER — SODIUM CHLORIDE 0.9 % IV SOLN: 500.0000 mL | Freq: Once | INTRAVENOUS | Status: DC

## 2021-01-30 NOTE — Progress Notes (Signed)
Called to room to assist during endoscopic procedure.  Patient ID and intended procedure confirmed with present staff. Received instructions for my participation in the procedure from the performing physician.  

## 2021-01-30 NOTE — Patient Instructions (Signed)
Handout given for polyps.  Await pathology results which will determine your next colonoscopy date.   YOU HAD AN ENDOSCOPIC PROCEDURE TODAY AT [facility]:   Refer to the procedure report that was given to you for any specific questions about what was found during the examination.  If the procedure report does not answer your questions, please call your gastroenterologist to clarify.  If you requested that your care partner not be given the details of your procedure findings, then the procedure report has been included in a sealed envelope for you to review at your convenience later.  YOU SHOULD EXPECT: Some feelings of bloating in the abdomen. Passage of more gas than usual.  Walking can help get rid of the air that was put into your GI tract during the procedure and reduce the bloating. If you had a lower endoscopy (such as a colonoscopy or flexible sigmoidoscopy) you may notice spotting of blood in your stool or on the toilet paper. If you underwent a bowel prep for your procedure, you may not have a normal bowel movement for a few days.  Please Note:  You might notice some irritation and congestion in your nose or some drainage.  This is from the oxygen used during your procedure.  There is no need for concern and it should clear up in a day or so.  SYMPTOMS TO REPORT IMMEDIATELY:   Following lower endoscopy (colonoscopy or flexible sigmoidoscopy):  Excessive amounts of blood in the stool  Significant tenderness or worsening of abdominal pains  Swelling of the abdomen that is new, acute  Fever of 100F or higher  For urgent or emergent issues, a gastroenterologist can be reached at any hour by calling [phone]. Do not use MyChart messaging for urgent concerns.    DIET:  We do recommend a small meal at first, but then you may proceed to your regular diet.  Drink plenty of fluids but you should avoid alcoholic beverages for 24 hours.  ACTIVITY:  You should plan to take  it easy for the rest of today and you should NOT DRIVE or use heavy machinery until tomorrow (because of the sedation medicines used during the test).    FOLLOW UP: Our staff will call the number listed on your records 48-72 hours following your procedure to check on you and address any questions or concerns that you may have regarding the information given to you following your procedure. If we do not reach you, we will leave a message.  We will attempt to reach you two times.  During this call, we will ask if you have developed any symptoms of COVID 19. If you develop any symptoms (ie: fever, flu-like symptoms, shortness of breath, cough etc.) before then, please call [phone].  If you test positive for Covid 19 in the 2 weeks post procedure, please call and report this information to [place].    If any biopsies were taken you will be contacted by phone or by letter within the next 1-3 weeks.  Please call us at [phone] if you have not heard about the biopsies in 3 weeks.    SIGNATURES/CONFIDENTIALITY: You and/or your care partner have signed paperwork which will be entered into your electronic medical record.  These signatures attest to the fact that that the information above on your After Visit Summary has been reviewed and is understood.  Full responsibility of the confidentiality of this discharge information lies with you and/or your care-partner.

## 2021-01-30 NOTE — Progress Notes (Signed)
A and O x3. Report to RN. Tolerated MAC anesthesia well.

## 2021-01-30 NOTE — Progress Notes (Signed)
Pt's states no medical or surgical changes since previsit or office visit.  CW - vitals 

## 2021-01-30 NOTE — Op Note (Signed)
[facility] Patient Name: Deborah Barton Procedure Date: 01/30/2021 9:25 AM MRN: [id number] Endoscopist: Jerene Bears , MD Age: 75 Referring MD:  Date of Birth: 07-Jul-1946 Gender: Female Account #: [id number] Procedure:                Colonoscopy Indications:              High risk colon cancer surveillance: Personal                            history of multiple (3) adenomas; last colonoscopy                            Dec 2016 Medicines:                Monitored Anesthesia Care Procedure:                Pre-Anesthesia Assessment:                           - Prior to the procedure, a History and Physical                            was performed, and patient medications and                            allergies were reviewed. The patient's tolerance of                            previous anesthesia was also reviewed. The risks                            and benefits of the procedure and the sedation                            options and risks were discussed with the patient.                            All questions were answered, and informed consent                            was obtained. Prior Anticoagulants: The patient has                            taken no previous anticoagulant or antiplatelet                            agents. ASA Grade Assessment: II - A patient with                            mild systemic disease. After reviewing the risks                            and benefits, the patient was deemed in  satisfactory condition to undergo the procedure.                           After obtaining informed consent, the colonoscope                            was passed under direct vision. Throughout the                            procedure, the patient's blood pressure, pulse, and                            oxygen saturations were monitored continuously. The                            Olympus PFC-H190DL [id number]) Colonoscope was                             introduced through the anus and advanced to the                            cecum, identified by appendiceal orifice and                            ileocecal valve. The colonoscopy was performed                            without difficulty. The patient tolerated the                            procedure well. The quality of the bowel                            preparation was good. The ileocecal valve,                            appendiceal orifice, and rectum were photographed. Scope In: 9:38:42 AM Scope Out: 10:00:26 AM Scope Withdrawal Time: 0 hours 14 minutes 24 seconds  Total Procedure Duration: 0 hours 21 minutes 44 seconds  Findings:                 The digital rectal exam was normal.                           A 3 mm polyp was found in the cecum. The polyp was                            sessile. The polyp was removed with a cold snare.                            Resection and retrieval were complete.                           A 5 mm polyp was found in the ascending colon. The  polyp was sessile. The polyp was removed with a                            cold snare. Resection and retrieval were complete.                           The exam was otherwise without abnormality on                            direct and retroflexion views. Complications:            No immediate complications. Estimated Blood Loss:     Estimated blood loss was minimal. Impression:               - One 3 mm polyp in the cecum, removed with a cold                            snare. Resected and retrieved.                           - One 5 mm polyp in the ascending colon, removed                            with a cold snare. Resected and retrieved.                           - The examination was otherwise normal on direct                            and retroflexion views. Recommendation:           - Patient has a contact number available for                             emergencies. The signs and symptoms of potential                            delayed complications were discussed with the                            patient. Return to normal activities tomorrow.                            Written discharge instructions were provided to the                            patient.                           - Resume previous diet.                           - Continue present medications.                           - Await pathology results.                           -  No recommendation at this time regarding repeat                            colonoscopy due to age at next screening interval.                            Will further determine after reviewing pathology                            results and with input from Dr. Silverio Decamp. Jerene Bears, MD 01/30/2021 10:06:53 AM This report has been signed electronically.

## 2021-02-03 ENCOUNTER — Telehealth: Payer: Self-pay

## 2021-02-03 NOTE — Telephone Encounter (Signed)
  Follow up Call-  Call back number 01/30/2021  Post procedure Call Back phone  # [phone]  Permission to leave phone message Yes  Some recent data might be hidden     Patient questions:  Do you have a fever, pain , or abdominal swelling? No. Pain Score  0 *  Have you tolerated food without any problems? Yes.    Have you been able to return to your normal activities? Yes.    Do you have any questions about your discharge instructions: Diet   No. Medications  No. Follow up visit  No.  Do you have questions or concerns about your Care? No.  Actions: * If pain score is 4 or above: No action needed, pain <4. Have you developed a fever since your procedure? *no 2.   Have you had an respiratory symptoms (SOB or cough) since your procedure? no  3.   Have you tested positive for COVID 19 since your procedure no  4.   Have you had any family members/close contacts diagnosed with the COVID 19 since your procedure?  no   If yes to any of these questions please route to Joylene John, RN and Joella Prince, RN

## 2021-02-06 ENCOUNTER — Encounter: Payer: Self-pay | Admitting: Internal Medicine

## 2021-04-16 ENCOUNTER — Ambulatory Visit: Payer: Medicare Other | Attending: Family

## 2021-04-16 DIAGNOSIS — Z23 Encounter for immunization: Secondary | ICD-10-CM

## 2021-05-13 DIAGNOSIS — H93A2 Pulsatile tinnitus, left ear: Secondary | ICD-10-CM | POA: Diagnosis not present

## 2021-05-13 DIAGNOSIS — H9113 Presbycusis, bilateral: Secondary | ICD-10-CM | POA: Diagnosis not present

## 2021-05-13 DIAGNOSIS — H903 Sensorineural hearing loss, bilateral: Secondary | ICD-10-CM | POA: Diagnosis not present

## 2021-05-15 DIAGNOSIS — H35033 Hypertensive retinopathy, bilateral: Secondary | ICD-10-CM | POA: Diagnosis not present

## 2021-05-15 DIAGNOSIS — H40023 Open angle with borderline findings, high risk, bilateral: Secondary | ICD-10-CM | POA: Diagnosis not present

## 2021-05-15 DIAGNOSIS — H524 Presbyopia: Secondary | ICD-10-CM | POA: Diagnosis not present

## 2021-05-15 DIAGNOSIS — H5712 Ocular pain, left eye: Secondary | ICD-10-CM | POA: Diagnosis not present

## 2021-05-15 DIAGNOSIS — H35373 Puckering of macula, bilateral: Secondary | ICD-10-CM | POA: Diagnosis not present

## 2021-05-19 ENCOUNTER — Other Ambulatory Visit: Payer: Self-pay | Admitting: Physician Assistant

## 2021-05-19 DIAGNOSIS — H93A2 Pulsatile tinnitus, left ear: Secondary | ICD-10-CM

## 2021-05-22 NOTE — Progress Notes (Signed)
   Covid-19 Vaccination Clinic  Name:  Deborah Barton    MRN: [id number] DOB: September 14, 1946  05/22/2021  [name] was observed post Covid-19 immunization for 15 minutes without incident. She was provided with Vaccine Information Sheet and instruction to access the V-Safe system.   [name] was instructed to call 911 with any severe reactions post vaccine: Marland Kitchen Difficulty breathing  . Swelling of face and throat  . A fast heartbeat  . A bad rash all over body  . Dizziness and weakness   Immunizations Administered    Name Date Dose VIS Date Route   Pfizer COVID-19 Vaccine 04/16/2021 10:00 AM 0.3 mL 10/15/2020 Intramuscular   Manufacturer: [place]   Lot: [id number]   [place]: [address]-5

## 2021-05-27 ENCOUNTER — Ambulatory Visit
Admission: RE | Admit: 2021-05-27 | Discharge: 2021-05-27 | Disposition: A | Discharge status: Home / Self Care | Payer: Medicare Other | Source: Ambulatory Visit | Attending: Physician Assistant | Admitting: Physician Assistant

## 2021-05-27 DIAGNOSIS — H93A2 Pulsatile tinnitus, left ear: Secondary | ICD-10-CM | POA: Diagnosis not present

## 2021-05-27 DIAGNOSIS — I6523 Occlusion and stenosis of bilateral carotid arteries: Secondary | ICD-10-CM | POA: Diagnosis not present

## 2021-05-28 ENCOUNTER — Other Ambulatory Visit: Payer: Self-pay | Admitting: Internal Medicine

## 2021-06-03 NOTE — Progress Notes (Signed)
I, Wendy Poet, LAT, ATC, am serving as scribe for Dr. Lynne Leader.  Deborah Barton is a 75 y.o. female who presents to [place] at [facility] today for R leg pain and numbness ongoing off and on for about a year worsening over the last month. She was last seen by Dr. Georgina Snell on 01/14/21 for R knee pain.  Since then, pt reports spasm like pain all along R leg. Pt locates pain to all over R knee w/ radiating pain into R lower leg and proximally into R buttock.  Low back pain: no R knee swelling: yes and R ankle Radiating pain: yes R LE numbness/tingling: yes Aggravating factors: doing yard work, Treatments tried: epsom salt, knee compression sleeve, colchicine, flexeril, elevation  Diagnostic imaging: R knee XR- 04/03/20  04/02/20 R LE venous doppler  Pertinent review of systems: No fevers or chills  Relevant historical information: Hypertension.  Needle phobia   Exam:  BP 123/77 (BP Location: Right Arm, Patient Position: Sitting, Cuff Size: Normal)   Pulse 84   Ht 5\' 2"  (1.575 m)   Wt 136 lb 3.2 oz (61.8 kg)   SpO2 98%   BMI 24.91 kg/m  General: Well Developed, well nourished, and in no acute distress.   MSK: Right hip normal-appearing Normal motion. Tender palpation lateral hip and greater trochanter. Hip abduction strength diminished 4/5.  External rotation strength intact.  Right knee moderate effusion normal motion with crepitation tender palpation medial joint line and posterior medial knee. Stable ligamentous exam.    Lab and Radiology Results  Procedure: Real-time Ultrasound Guided Injection of right knee superior lateral patellar space Device: Philips Affiniti 50G Images permanently stored and available for review in PACS Ultrasound evaluation prior to injection reveals a moderate effusion and a moderate Baker's cyst.  Degenerative changes present medial and lateral joint line. Verbal informed consent obtained.  Discussed risks and  benefits of procedure. Warned about infection bleeding damage to structures skin hypopigmentation and fat atrophy among others. Patient expresses understanding and agreement Time-out conducted.   Noted no overlying erythema, induration, or other signs of local infection.   Skin prepped in a sterile fashion.   Local anesthesia: Topical Ethyl chloride.   With sterile technique and under real time ultrasound guidance: 40 mg of Kenalog and 2 mL of Marcaine injected into right knee joint. Fluid seen entering the joint capsule.   Completed without difficulty   Pain immediately resolved suggesting accurate placement of the medication.   Advised to call if fevers/chills, erythema, induration, drainage, or persistent bleeding.   Images permanently stored and available for review in the ultrasound unit.  Impression: Technically successful ultrasound guided injection.      EXAM: RIGHT KNEE 3 VIEWS   COMPARISON:  Radiograph 01/05/2018   FINDINGS: Imaging obtained weight-bearing. Medial tibiofemoral and patellofemoral spurring. Mild medial tibiofemoral joint space narrowing. Faint chondrocalcinosis. Small amount of air in the joint is likely related to recent intervention. Small joint effusion. No erosion, periosteal reaction, or evidence of focal bone lesion.   IMPRESSION: 1. Mild osteoarthritis and chondrocalcinosis, similar to 2019 exam. 2. Small joint effusion. Small amount of air in the joint is likely related to recent intervention.     Electronically Signed   By: Keith Rake M.D.   On: 04/03/2020 20:40 I, Lynne Leader, personally (independently) visualized and performed the interpretation of the images attached in this note.     Assessment and Plan: 75 y.o. female with right knee pain due  to DJD.  Patient also has a Baker's cyst which is contributory to her pain.  She would be a good candidate for aspiration of the Baker's cyst and knee joint and injection.  However she has  needle phobia and does well with quick injections instead of a more complicated aspiration and injection.  Plan for steroid injection today continue colchicine for presumed pseudogout and Voltaren gel.  She does have some calf pain and swelling and is concerned that she may have a DVT.  I think it is probably a leaking Baker's cyst into her calf that is causing her calf pain but DVT is certainly a possibility and we will proceed with vascular ultrasound to evaluate for possibility of DVT.  Right lateral hip pain and lateral thigh pain due to trochanteric bursitis/hip abductor tendinopathy.  Plan for home exercise program taught today by ATC.  Not improving patient will notify me and I will refer to physical therapy.   PDMP not reviewed this encounter. Orders Placed This Encounter  Procedures   [place] LIMITED JOINT SPACE STRUCTURES LOW RIGHT(NO LINKED CHARGES)    Standing Status:   Future    Number of Occurrences:   1    Standing Expiration Date:   12/04/2021    Order Specific Question:   Reason for Exam (SYMPTOM  OR DIAGNOSIS REQUIRED)    Answer:   right knee pain    Order Specific Question:   Preferred imaging location?    Answer:   [place]   No orders of the defined types were placed in this encounter.    Discussed warning signs or symptoms. Please see discharge instructions. Patient expresses understanding.   The above documentation has been reviewed and is accurate and complete Lynne Leader, M.D.

## 2021-06-04 ENCOUNTER — Ambulatory Visit: Payer: Self-pay

## 2021-06-04 ENCOUNTER — Ambulatory Visit: Payer: Medicare Other | Admitting: Family Medicine

## 2021-06-04 ENCOUNTER — Other Ambulatory Visit: Payer: Self-pay

## 2021-06-04 VITALS — BP 123/77 | HR 84 | Ht 62.0 in | Wt 136.2 lb

## 2021-06-04 DIAGNOSIS — M7989 Other specified soft tissue disorders: Secondary | ICD-10-CM | POA: Diagnosis not present

## 2021-06-04 DIAGNOSIS — M1711 Unilateral primary osteoarthritis, right knee: Secondary | ICD-10-CM | POA: Diagnosis not present

## 2021-06-04 DIAGNOSIS — M25561 Pain in right knee: Secondary | ICD-10-CM

## 2021-06-04 NOTE — Patient Instructions (Addendum)
Thank you for coming in today.   You should hear soon about scheduling the vascular ultrasound.   Please complete the exercises that the athletic trainer went over with you:  View at [email or web address]-[email or web address] using code: [facility]  Please use Voltaren gel (Generic Diclofenac Gel) up to 4x daily for pain as needed.  This is available over-the-counter as both the name brand Voltaren gel and the generic diclofenac gel.   Take the colchicine daily as needed for knee swelling.   Recheck as needed.

## 2021-06-08 ENCOUNTER — Other Ambulatory Visit: Payer: Self-pay

## 2021-06-08 ENCOUNTER — Ambulatory Visit (HOSPITAL_COMMUNITY)
Admission: RE | Admit: 2021-06-08 | Discharge: 2021-06-08 | Disposition: A | Discharge status: Home / Self Care | Payer: Medicare Other | Source: Ambulatory Visit | Attending: Family Medicine | Admitting: Family Medicine

## 2021-06-08 DIAGNOSIS — M7989 Other specified soft tissue disorders: Secondary | ICD-10-CM

## 2021-06-09 ENCOUNTER — Telehealth: Payer: Self-pay | Admitting: Family Medicine

## 2021-06-09 NOTE — Progress Notes (Signed)
Left a VM for pt to return our call to go over their test results. If pt calls back, please relay Dr. Clovis Riley result note.

## 2021-06-09 NOTE — Progress Notes (Signed)
No evidence of DVT present.

## 2021-06-09 NOTE — Progress Notes (Signed)
Pt returned VM and was provided results by Clayborn Bigness. Pt verbalized understanding.

## 2021-06-09 NOTE — Telephone Encounter (Signed)
Pt returning our call, given neg DVT results from scan yesterday.  Pt questioning if Bakers cyst can be removed from her knee, or how to treat.

## 2021-06-10 NOTE — Telephone Encounter (Signed)
The Baker's cyst is part of the knee arthritis issue in your knee.  We have been trying to treat that with knee injections.  I could try to directly remove the fluid from the Baker's cyst in the back of the knee and do an injection back there in the future.  Additionally there is a surgery for it in the future if needed.

## 2021-06-10 NOTE — Telephone Encounter (Signed)
Called and left detailed message with Dr. Clovis Riley advice regarding other options to treat her Baker's cyst.  Had knee steroid injection at her last visit on 06/04/21 so will need to wait to try another potential injection.

## 2021-06-25 ENCOUNTER — Telehealth: Payer: Self-pay | Admitting: Internal Medicine

## 2021-06-25 NOTE — Chronic Care Management (AMB) (Signed)
  Chronic Care Management   Note  06/25/2021 Name: Deborah Barton MRN: [id number] DOB: 1946-04-29  Deborah Barton is a 75 y.o. year old female who is a primary care patient of Hoyt Koch, MD. I reached out to [facility] by phone today in response to a referral sent by Deborah Barton's PCP, Hoyt Koch, MD.   Deborah Barton was given information about Chronic Care Management services today including:  CCM service includes personalized support from designated clinical staff supervised by her physician, including individualized plan of care and coordination with other care providers 24/7 contact phone numbers for assistance for urgent and routine care needs. Service will only be billed when office clinical staff spend 20 minutes or more in a month to coordinate care. Only one practitioner may furnish and bill the service in a calendar month. The patient may stop CCM services at any time (effective at the end of the month) by phone call to the office staff.   Patient agreed to services and verbal consent obtained.   Follow up plan:   Deborah Barton Upstream Scheduler

## 2021-07-23 ENCOUNTER — Other Ambulatory Visit: Payer: Self-pay | Admitting: Internal Medicine

## 2021-07-24 ENCOUNTER — Telehealth: Payer: Self-pay

## 2021-07-24 NOTE — Chronic Care Management (AMB) (Signed)
    Chronic Care Management Pharmacy Assistant   Name: Mozel Psencik  MRN: [id number] DOB: 1946/05/08  Deborah Barton is an 75 y.o. year old female who presents for her initial CCM visit with the clinical pharmacist.  Reason for Encounter: Initial CCM Visit   Recent office visits:  No visits noted   Recent consult visits:  06/04/21- Lynne Leader, MD (Sports Medicine)- seen for right leg pain, [place] scheduled, no medication changes, no follow up documented  05/13/21- Jolene Provost, PA-C (Otolaryngology)- seen for initial consult of pulsatile tinnitus of left ear, no medication changes, follow up as needed  Hospital visits:  None in previous 6 months  Medications: Outpatient Encounter Medications as of 07/24/2021  Medication Sig   acetaminophen (TYLENOL) 500 MG tablet Take 1,000 mg by mouth every 6 (six) hours as needed for moderate pain.   Apoaequorin (PREVAGEN PO) Take by mouth daily as needed.   Ascorbic Acid (VITAMIN C) 1000 MG tablet Take 1,000 mg by mouth daily.   cholecalciferol (VITAMIN D3) 25 MCG (1000 UT) tablet Take 1,000 Units by mouth daily.   colchicine 0.6 MG tablet Take 1 tablet (0.6 mg total) by mouth daily as needed (knee pain and swelling due to pseudogout).   cyclobenzaprine (FLEXERIL) 5 MG tablet Take 1 tablet (5 mg total) by mouth 3 (three) times daily as needed for muscle spasms.   DULoxetine (CYMBALTA) 60 MG capsule TAKE 1 CAPSULE BY MOUTH  DAILY   fluticasone (FLONASE) 50 MCG/ACT nasal spray Place 2 sprays into both nostrils daily. (Patient taking differently: Place 2 sprays into both nostrils daily as needed.)   hydroxypropyl methylcellulose / hypromellose (ISOPTO TEARS / GONIOVISC) 2.5 % ophthalmic solution Place 1 drop into both eyes as needed for dry eyes.   LORazepam (ATIVAN) 1 MG tablet Take 1 tablet (1 mg total) by mouth at bedtime as needed for anxiety.   Multiple Vitamin (MULTIVITAMIN WITH MINERALS) TABS tablet Take 1 tablet by mouth  daily. Adult 50+ vit.   triamcinolone ointment (KENALOG) 0.1 % Apply 1 application topically 4 (four) times daily. On chapped lips (Patient taking differently: Apply 1 application topically 4 (four) times daily as needed. On chapped lips)   vitamin B-12 (CYANOCOBALAMIN) 500 MCG tablet Take 500 mcg by mouth daily.   No facility-administered encounter medications on file as of 07/24/2021.   Current Documented Medications acetaminophen 500 MG  Apoaequorin  Ascorbic Acid 1000 MG cholecalciferol  25 MCG  colchicine 0.6 MG - 90 DS last filled 01/14/21 cyclobenzaprine 5 MG  DULoxetine  60 MG - 90 DS last filled 07/01/21 fluticasone 50 MCG/ACT- 90 DS last filled 03/03/21 ISOPTO TEARS / GONIOVISC 2.5 % ophthalmic solution LORazepam 1 MG- 30 DS last filled 12/04/20 Multiple Vitamin  triamcinolone ointment  0.1 % vitamin B-12 [address] [place] CPA, CMA

## 2021-08-03 ENCOUNTER — Ambulatory Visit: Payer: Medicare Other

## 2021-08-03 NOTE — Progress Notes (Deleted)
Chronic Care Management Pharmacy Note  08/03/2021 Name:  Deborah Barton MRN:  [id number] DOB:  1946-02-03  Summary: ***  Recommendations/Changes made from today's visit: ***  Plan: ***  Subjective: Deborah Barton is an 75 y.o. year old female who is a primary patient of Hoyt Koch, MD.  The CCM team was consulted for assistance with disease management and care coordination needs.    Engaged with patient face to face for initial visit in response to provider referral for pharmacy case management and/or care coordination services.   Consent to Services:  The patient was given the following information about Chronic Care Management services today, agreed to services, and gave verbal consent: 1. CCM service includes personalized support from designated clinical staff supervised by the primary care provider, including individualized plan of care and coordination with other care providers 2. 24/7 contact phone numbers for assistance for urgent and routine care needs. 3. Service will only be billed when office clinical staff spend 20 minutes or more in a month to coordinate care. 4. Only one practitioner may furnish and bill the service in a calendar month. 5.The patient may stop CCM services at any time (effective at the end of the month) by phone call to the office staff. 6. The patient will be responsible for cost sharing (co-pay) of up to 20% of the service fee (after annual deductible is met). Patient agreed to services and consent obtained.  Patient Care Team: Hoyt Koch, MD as PCP - General (Internal Medicine) Tomasa Blase, [facility] as Pharmacist (Pharmacist)  Recent office visits:  12/03/2020 - PCP visit - no changes at visit, given shingles vaccine    Recent consult visits:  06/04/21- Lynne Leader, MD (Sports Medicine)- seen for right leg pain, steroid injection given, [place] completed - negative for DVT,  no medication changes, no follow up  documented 05/13/21- Jolene Provost, PA-C (Otolaryngology)- seen for initial consult of pulsatile tinnitus of left ear, no medication changes, follow up as needed   Hospital visits:  None in previous 6 months  Objective:  Lab Results  Component Value Date   CREATININE 0.89 12/03/2020   BUN 14 12/03/2020   GFR 64.51 12/03/2020   GFRNONAA >60 12/22/2019   GFRAA >60 12/22/2019   NA 139 12/03/2020   K 4.2 12/03/2020   CALCIUM 10.1 12/03/2020   CO2 30 12/03/2020   GLUCOSE 73 12/03/2020    Lab Results  Component Value Date/Time   HGBA1C 5.8 12/04/2019 01:44 PM   GFR 64.51 12/03/2020 11:17 AM   GFR 69.13 12/04/2019 01:44 PM    Last diabetic Eye exam:  No results found for: HMDIABEYEEXA  Last diabetic Foot exam:  No results found for: HMDIABFOOTEX   Lab Results  Component Value Date   CHOL 201 (H) 12/03/2020   HDL 67.20 12/03/2020   LDLCALC 122 (H) 12/03/2020   LDLDIRECT 139.4 12/10/2010   TRIG 59.0 12/03/2020   CHOLHDL 3 12/03/2020    Hepatic Function Latest Ref Rng & Units 12/03/2020 12/04/2019 11/30/2018  Total Protein 6.0 - 8.3 g/dL 7.6 7.2 7.5  Albumin 3.5 - 5.2 g/dL 4.4 4.3 4.4  AST 0 - 37 U/L $Remo'26 21 18  'fGkeg$ ALT 0 - 35 U/L $Remo'16 15 13  'tBEZs$ Alk Phosphatase 39 - 117 U/L 84 103 82  Total Bilirubin 0.2 - 1.2 mg/dL 0.6 0.5 0.6  Bilirubin, Direct 0.0 - 0.3 mg/dL - - -    Lab Results  Component Value Date/Time   TSH 2.03  12/04/2019 01:44 PM   TSH 1.71 06/27/2014 03:39 PM    CBC Latest Ref Rng & Units 12/03/2020 12/04/2019 11/30/2018  WBC 4.0 - 10.5 K/uL 6.1 8.7 5.9  Hemoglobin 12.0 - 15.0 g/dL 11.5(L) 10.8(L) 11.6(L)  Hematocrit 36.0 - 46.0 % 36.2 33.6(L) 35.8(L)  Platelets 150.0 - 400.0 K/uL 285.0 296.0 276.0    Lab Results  Component Value Date/Time   VD25OH 43.65 12/04/2019 01:44 PM    Clinical ASCVD: No  The 10-year ASCVD risk score Mikey Bussing DC Jr., et al., 2013) is: 14.4%   Values used to calculate the score:     Age: 24 years     Sex: Female     Is Non-Hispanic  African American: Yes     Diabetic: No     Tobacco smoker: No     Systolic Blood Pressure: 599 mmHg     Is BP treated: No     HDL Cholesterol: 67.2 mg/dL     Total Cholesterol: 201 mg/dL    Depression screen [facility] 2/9 12/03/2020 12/03/2019 11/30/2018  Decreased Interest 0 0 0  Down, Depressed, Hopeless 0 1 0  PHQ - 2 Score 0 1 0     ***Other: (CHADS2VASc if Afib, MMRC or CAT for COPD, ACT, DEXA)  Social History   Tobacco Use  Smoking Status Never  Smokeless Tobacco Never   BP Readings from Last 3 Encounters:  06/04/21 123/77  01/30/21 125/74  01/14/21 118/82   Pulse Readings from Last 3 Encounters:  06/04/21 84  01/30/21 66  01/14/21 93   Wt Readings from Last 3 Encounters:  06/04/21 136 lb 3.2 oz (61.8 kg)  01/30/21 135 lb (61.2 kg)  01/14/21 135 lb 12.8 oz (61.6 kg)   BMI Readings from Last 3 Encounters:  06/04/21 24.91 kg/m  01/30/21 24.69 kg/m  01/14/21 24.84 kg/m    Assessment/Interventions: Review of patient past medical history, allergies, medications, health status, including review of consultants reports, laboratory and other test data, was performed as part of comprehensive evaluation and provision of chronic care management services.   SDOH:  (Social Determinants of Health) assessments and interventions performed: {yes/no:20286}  SDOH Screenings   Alcohol Screen: Not on file  Depression (PHQ2-9): Low Risk    PHQ-2 Score: 0  Financial Resource Strain: Not on file  Food Insecurity: Not on file  Housing: Not on file  Physical Activity: Not on file  Social Connections: Not on file  Stress: Not on file  Tobacco Use: Low Risk    Smoking Tobacco Use: Never   Smokeless Tobacco Use: Never  Transportation Needs: Not on file    [place]  No Known Allergies  Medications Reviewed Today     Reviewed by Mare Ferrari (Technician) on 06/04/21 at 1038  Med List Status: <None>   Medication Order Taking? Sig Documenting Provider Last Dose Status  Informant  acetaminophen (TYLENOL) 500 MG tablet [id number] No Take 1,000 mg by mouth every 6 (six) hours as needed for moderate pain. [provider] Past Week Active Self  Apoaequorin (PREVAGEN PO) [id number] No Take by mouth daily as needed. [provider] Unknown Active   Ascorbic Acid (VITAMIN C) 1000 MG tablet [id number] No Take 1,000 mg by mouth daily. [provider] Past Week Active Self  cholecalciferol (VITAMIN D3) 25 MCG (1000 UT) tablet [id number] No Take 1,000 Units by mouth daily. [provider] Past Week Active   colchicine 0.6 MG tablet [id number] No Take 1 tablet (0.6 mg total)  by mouth daily as needed (knee pain and swelling due to pseudogout). Gregor Hams, MD Unknown Active   cyclobenzaprine (FLEXERIL) 5 MG tablet [id number] No Take 1 tablet (5 mg total) by mouth 3 (three) times daily as needed for muscle spasms. Hoyt Koch, MD Unknown Active   DULoxetine (CYMBALTA) 60 MG capsule [id number]  TAKE 1 CAPSULE BY MOUTH  DAILY Hoyt Koch, MD  Active   fluticasone [facility]) 50 MCG/ACT nasal spray [id number] No Place 2 sprays into both nostrils daily.  Patient taking differently: Place 2 sprays into both nostrils daily as needed.   Marrian Salvage, FNP Unknown Active   hydroxypropyl methylcellulose / hypromellose (ISOPTO TEARS / GONIOVISC) 2.5 % ophthalmic solution [id number] No Place 1 drop into both eyes as needed for dry eyes. [provider] Unknown Active Self  LORazepam (ATIVAN) 1 MG tablet [id number] No Take 1 tablet (1 mg total) by mouth at bedtime as needed for anxiety. Hoyt Koch, MD Unknown Active   Multiple Vitamin (MULTIVITAMIN WITH MINERALS) TABS tablet [id number] No Take 1 tablet by mouth daily. Adult 50+ vit. [provider] Past Week Active Self  triamcinolone ointment (KENALOG) 0.1 % [id number] No Apply 1 application topically 4 (four) times daily. On chapped lips  Patient taking  differently: Apply 1 application topically 4 (four) times daily as needed. On chapped lips   Plotnikov, Evie Lacks, MD Unknown Active   vitamin B-12 (CYANOCOBALAMIN) 500 MCG tablet [id number] No Take 500 mcg by mouth daily. 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    Immunization History  Administered Date(s) Administered   Fluad Quad(high Dose 65+) 12/03/2020   Influenza Split 12/23/2011   Influenza Whole 12/17/2009, 12/16/2010   Influenza, High Dose Seasonal PF 11/08/2016, 11/23/2017, 11/30/2018   Influenza, Seasonal, Injecte, Preservative Fre 01/04/2013   Influenza,inj,Quad PF,6+ Mos 09/23/2014, 11/03/2015   Influenza-Unspecified 11/11/2019   PFIZER(Purple Top)SARS-COV-2 Vaccination 03/21/2020, 04/11/2020, 10/15/2020, 04/16/2021   Pneumococcal Conjugate-13 04/02/2014   Pneumococcal Polysaccharide-23 01/04/2013   Td 12/09/2008   Tdap 12/03/2019    Conditions to be addressed/monitored:  {USCCMDZASSESSMENTOPTIONS:23563}  There are no care plans that you recently modified to display for this patient.     Medication Assistance: {MEDASSISTANCEINFO:25044}  Compliance/Adherence/Medication fill history: Care Gaps: ***  Patient's preferred pharmacy is:  [id number] - [place], [address] [place] [address] Phone: [phone] Fax: [phone]  [facility]  ([place] - [place], [address] [place]  [address] Phone: [phone] Fax: [phone]   Uses pill box? {Yes or If no, why not?:20788} Pt endorses ***% compliance  Care Plan and Follow Up Patient Decision:  {FOLLOWUP:24991}  Plan: {CM FOLLOW UP DIXV:85501}  ***   Current Barriers:  {pharmacybarriers:24917}  Pharmacist Clinical Goal(s):  Patient will {PHARMACYGOALCHOICES:24921} through collaboration with PharmD and provider.   Interventions: 1:1 collaboration with Hoyt Koch, MD regarding development and update of comprehensive plan of care as evidenced by  provider attestation and co-signature Inter-disciplinary care team collaboration (see longitudinal plan of care) Comprehensive medication review performed; medication list updated in electronic medical record  Hypertension (BP goal <130/80) -{US controlled/uncontrolled:25276} -Current treatment: N/a - diet and exercise controlled at this time  -Medications previously tried: lisinopril, hydrochlorothiazide   -Current home readings: *** -Current dietary habits: *** -Current exercise habits: *** -{ACTIONS;DENIES/REPORTS:21021675} hypotensive/hypertensive symptoms -Educated on {CCM BP Counseling:25124} -Counseled to monitor BP at home ***, document, and provide log at future appointments -{CCMPHARMDINTERVENTION:25122}  Depression/Anxiety (Goal: Promotion of positive mood / prevention of anxiety attacks) -{US controlled/uncontrolled:25276} -Current treatment: Duloxetine 60mg  - 1 capsule daily  Lorazepam 1mg  - 1 tablet at bedtime as needed  -Medications previously tried/failed: n/a -PHQ9: *** -GAD7: *** -Connected with *** for mental health support -Educated on {CCM mental health counseling:25127} -{CCMPHARMDINTERVENTION:25122}  Gout (Goal: treatment/ pain control from gout attack) -{US controlled/uncontrolled:25276} -Current treatment  Colchicine 0.6mg  - 1 tablet daily as needed  -Medications previously tried: ***   -{CCMPHARMDINTERVENTION:25122}  Chronic Pain / Muscle Spasms (Goal: Pain control) -{US controlled/uncontrolled:25276} -Current treatment  Acetaminophen 500mg  - 2 tablet every 6 hours as needed  Cyclobenzaprine 5mg  - 1 tablet 3 times daily as needed  -Medications previously tried: naproxen, gabapentin  -{CCMPHARMDINTERVENTION:25122}   Health Maintenance -Vaccine gaps: *** -Current therapy:  Fluticasone 42mcg/act - 2 sprays into each nostril daily if needed  Triamcinolone ointment 0.1% - applied 4 times daily as needed  Prevagen - 1 tablet daily  Vitamin D3 1000 units - 1 tablet daily  Vitamin B12 mcg - 1 tablet daily  Multivitamin - 1 tablet daily  Acetaminophen 500mg  - 2 tablets every 6 hours as needed  Vitamin C 1000mg  - 1 tablet daily  Isopto Tears - 1 drop into both eyes daily as needed  -Educated on {ccm supplement counseling:25128} -{CCM Patient satisfied:25129} -{CCMPHARMDINTERVENTION:25122}  Patient Goals/Self-Care Activities Patient will:  - {pharmacypatientgoals:24919}  Follow Up Plan: {CM FOLLOW UP QMVH:84696}   Current Chart prep = 20 minutes

## 2021-09-25 ENCOUNTER — Telehealth: Payer: Self-pay | Admitting: Internal Medicine

## 2021-09-25 NOTE — Telephone Encounter (Signed)
Patient wants to know what type of pneumonia vaccine she needs  Please call & follow up

## 2021-09-28 NOTE — Telephone Encounter (Signed)
Fortunately none as she is up to date

## 2021-09-30 NOTE — Telephone Encounter (Signed)
Called patient. LVM letting her know that she is up to date on her vaccines. Office number was provided in case she has additional questions or concerns.

## 2021-10-05 ENCOUNTER — Other Ambulatory Visit: Payer: Self-pay | Admitting: Internal Medicine

## 2021-10-05 ENCOUNTER — Telehealth: Payer: Self-pay | Admitting: Internal Medicine

## 2021-10-05 NOTE — Telephone Encounter (Signed)
See below

## 2021-10-05 NOTE — Telephone Encounter (Signed)
Patient says she is still having muscle spasms in both legs that are extending into her buttocks area  Says she still has some meds from previous rx but they have not been helping lately  Wants to know what provider recommends or if she needs to come in for an OV  Please call [phone]

## 2021-10-05 NOTE — Telephone Encounter (Signed)
Can we verify what med patient is referring to that she has taken without relief?

## 2021-10-07 NOTE — Telephone Encounter (Signed)
Called patient. LVM asking her to call back and discuss. Office number was provided.

## 2021-11-10 ENCOUNTER — Telehealth: Payer: Self-pay | Admitting: Internal Medicine

## 2021-11-10 NOTE — Telephone Encounter (Signed)
LVM for pt to rtn my call to schedule AWV with [facility]. Please schedule AWV if pt calls the office  

## 2021-12-07 ENCOUNTER — Encounter: Payer: Self-pay | Admitting: Internal Medicine

## 2021-12-07 ENCOUNTER — Other Ambulatory Visit: Payer: Self-pay

## 2021-12-07 ENCOUNTER — Ambulatory Visit (INDEPENDENT_AMBULATORY_CARE_PROVIDER_SITE_OTHER): Payer: Medicare Other | Admitting: Internal Medicine

## 2021-12-07 VITALS — BP 126/80 | HR 79 | Resp 18 | Ht 62.0 in | Wt 130.8 lb

## 2021-12-07 DIAGNOSIS — R6889 Other general symptoms and signs: Secondary | ICD-10-CM

## 2021-12-07 DIAGNOSIS — I10 Essential (primary) hypertension: Secondary | ICD-10-CM

## 2021-12-07 DIAGNOSIS — Z0000 Encounter for general adult medical examination without abnormal findings: Secondary | ICD-10-CM | POA: Diagnosis not present

## 2021-12-07 DIAGNOSIS — F411 Generalized anxiety disorder: Secondary | ICD-10-CM

## 2021-12-07 LAB — CBC
HCT: 37.3 % (ref 36.0–46.0)
Hemoglobin: 11.7 g/dL — ABNORMAL LOW (ref 12.0–15.0)
MCHC: 31.4 g/dL (ref 30.0–36.0)
MCV: 72.7 fl — ABNORMAL LOW (ref 78.0–100.0)
Platelets: 298 10*3/uL (ref 150.0–400.0)
RBC: 5.12 Mil/uL — ABNORMAL HIGH (ref 3.87–5.11)
RDW: 15.8 % — ABNORMAL HIGH (ref 11.5–15.5)
WBC: 5.6 10*3/uL (ref 4.0–10.5)

## 2021-12-07 LAB — COMPREHENSIVE METABOLIC PANEL
ALT: 15 U/L (ref 0–35)
AST: 21 U/L (ref 0–37)
Albumin: 4.4 g/dL (ref 3.5–5.2)
Alkaline Phosphatase: 96 U/L (ref 39–117)
BUN: 16 mg/dL (ref 6–23)
CO2: 29 mEq/L (ref 19–32)
Calcium: 10.2 mg/dL (ref 8.4–10.5)
Chloride: 102 mEq/L (ref 96–112)
Creatinine, Ser: 0.86 mg/dL (ref 0.40–1.20)
GFR: 66.74 mL/min (ref >60.00)
Glucose, Bld: 73 mg/dL (ref 70–99)
Potassium: 3.6 mEq/L (ref 3.5–5.1)
Sodium: 139 mEq/L (ref 135–145)
Total Bilirubin: 0.6 mg/dL (ref 0.2–1.2)
Total Protein: 7.7 g/dL (ref 6.0–8.3)

## 2021-12-07 LAB — LIPID PANEL
Cholesterol: 220 mg/dL — ABNORMAL HIGH (ref 0–200)
HDL: 70.6 mg/dL (ref >39.00)
LDL Cholesterol: 126 mg/dL — ABNORMAL HIGH (ref 0–99)
NonHDL: 149.88
Total CHOL/HDL Ratio: 3
Triglycerides: 117 mg/dL (ref 0.0–149.0)
VLDL: 23.4 mg/dL (ref 0.0–40.0)

## 2021-12-07 LAB — MAGNESIUM: Magnesium: 1.7 mg/dL (ref 1.5–2.5)

## 2021-12-07 MED ORDER — FLUTICASONE PROPIONATE 50 MCG/ACT NA SUSP: 2.0000 | Freq: Every day | NASAL | 6 refills | Status: DC

## 2021-12-07 MED ORDER — FLUTICASONE PROPIONATE 50 MCG/ACT NA SUSP: 2.0000 | Freq: Every day | NASAL | 3 refills | Status: DC

## 2021-12-07 NOTE — Assessment & Plan Note (Addendum)
Flu shot up to date. Covid-19 booster up to date. Pneumonia complete. Shingrix completed at pharmacy. Tetanus due 2030. Colonoscopy due 2027. Mammogram due 2023, pap smear aged out and dexa complete. Counseled about sun safety and mole surveillance. Counseled about the dangers of distracted driving. Given 10 year screening recommendations.

## 2021-12-07 NOTE — Assessment & Plan Note (Signed)
Uses lorazepam daily for sleep/anxiety. Can refill as needed.

## 2021-12-07 NOTE — Patient Instructions (Addendum)
I have refilled the nose spray flonase to try to reduce the noise in the ear. Another option is to try claritin or zyrtec over the counter daily to help.  The EKG is normal today and not different than before.

## 2021-12-07 NOTE — Assessment & Plan Note (Signed)
BP at goal off medications. Checking EKG today which is stable from prior. Checking labs and adjust as needed.

## 2021-12-07 NOTE — Progress Notes (Signed)
Subjective:   Patient ID: Deborah Barton, female    DOB: 1946/02/08, 75 y.o.   MRN: [id number]  HPI Here for medicare wellness and physical, no new complaints. Please see A/P for status and treatment of chronic medical problems.   Diet: heart healthy Physical activity: active, exercises Depression/mood screen: negative Hearing: intact to whispered voice, recent testing Visual acuity: grossly normal, performs annual eye exam  ADLs: capable Fall risk: none Home safety: good Cognitive evaluation: intact to orientation, naming, recall and repetition EOL planning: adv directives discussed  [place] Visit from 12/07/2021 in [place] at [facility] Total Score 0        Fall Risk 11/30/2018 12/03/2019 12/22/2019 12/03/2020 12/07/2021  Falls in the past year? 1 0 - 0 0  Was there an injury with Fall? 0 - - 0 0  Fall Risk Category Calculator 1 - - 0 0  Fall Risk Category Low - - Low Low  Patient Fall Risk Level - Low fall risk Low fall risk - Low fall risk    I have personally reviewed and have noted 1. The patient's medical and social history - reviewed today no changes 2. Their use of alcohol, tobacco or illicit drugs 3. Their current medications and supplements 4. The patient's functional ability including ADL's, fall risks, home safety risks and hearing or visual impairment. 5. Diet and physical activities 6. Evidence for depression or mood disorders 7. Care team reviewed and updated 8.  The patient is 58 on an opioid pain medication and this was reviewed with patient and non-opioid pain medication options were reviewed and offered to patient, their pain treatment plan and severity was discussed with them. We have considered referrals as appropriate for patient. Opioid risk factors were also considered and reviewed.   Patient Care Team: Hoyt Koch, MD as PCP - General (Internal Medicine) Delice Bison, Darnelle Maffucci, [facility] as Pharmacist  (Pharmacist) Past Medical History:  Diagnosis Date  . Acid reflux    hx of  . Anemia    hx of-  . Anxiety    PRN meds  . Arthritis    RIGHT knee  . Cataract    bilateral sx  . Hypertension    situational - not taking med  . Neuropathy    Past Surgical History:  Procedure Laterality Date  . ABDOMINAL HYSTERECTOMY  1994  . CHOLECYSTECTOMY  1975   Family History  Problem Relation Barton of Onset  . Anemia Other   . Arthritis Other   . Asthma Other   . Depression Other   . Hypertension Other   . Stroke Other   . Uterine cancer Sister 59  . Ovarian cancer Maternal Aunt 60  . Ovarian cancer Maternal Grandmother 5  . Breast cancer Paternal Grandmother   . Colon cancer Neg Hx   . Esophageal cancer Neg Hx   . Stomach cancer Neg Hx   . Rectal cancer Neg Hx   . Colon polyps Neg Hx    Review of Systems  Constitutional: Negative.   HENT:  Positive for tinnitus.   Eyes: Negative.   Respiratory:  Negative for cough, chest tightness and shortness of breath.   Cardiovascular:  Negative for chest pain, palpitations and leg swelling.  Gastrointestinal:  Negative for abdominal distention, abdominal pain, constipation, diarrhea, nausea and vomiting.  Musculoskeletal:  Positive for myalgias.  Skin: Negative.   Neurological: Negative.   Psychiatric/Behavioral: Negative.     Objective:  Physical Exam Constitutional:  Appearance: She is well-developed.  HENT:     Head: Normocephalic and atraumatic.  Cardiovascular:     Rate and Rhythm: Normal rate and regular rhythm.  Pulmonary:     Effort: Pulmonary effort is normal. No respiratory distress.     Breath sounds: Normal breath sounds. No wheezing or rales.  Abdominal:     General: Bowel sounds are normal. There is no distension.     Palpations: Abdomen is soft.     Tenderness: There is no abdominal tenderness. There is no rebound.  Musculoskeletal:     Cervical back: Normal range of motion.  Skin:    General: Skin is warm  and dry.  Neurological:     Mental Status: She is alert and oriented to person, place, and time.     Coordination: Coordination normal.    Vitals:   12/07/21 1055  BP: 126/80  Pulse: 79  Resp: 18  SpO2: 99%  Weight: 142 lb 3.2 oz (64.5 kg)  Height: 5\' 2"  (1.575 m)   EKG: Rate 82, axis normal, interval normal, sinus, no st or t wave changes, no significant change compared to prior 2019   This visit occurred during the SARS-CoV-2 public health emergency.  Safety protocols were in place, including screening questions prior to the visit, additional usage of staff PPE, and extensive cleaning of exam room while observing appropriate contact time as indicated for disinfecting solutions.   Assessment & Plan:

## 2021-12-07 NOTE — Assessment & Plan Note (Signed)
Taking prevagen and overall stable.

## 2021-12-17 ENCOUNTER — Other Ambulatory Visit: Payer: Self-pay | Admitting: Internal Medicine

## 2021-12-17 ENCOUNTER — Encounter: Payer: Self-pay | Admitting: Nurse Practitioner

## 2021-12-17 ENCOUNTER — Telehealth: Payer: Self-pay | Admitting: Internal Medicine

## 2021-12-17 ENCOUNTER — Other Ambulatory Visit: Payer: Self-pay

## 2021-12-17 ENCOUNTER — Ambulatory Visit (INDEPENDENT_AMBULATORY_CARE_PROVIDER_SITE_OTHER): Payer: Medicare Other | Admitting: Nurse Practitioner

## 2021-12-17 VITALS — BP 130/82 | Ht 61.0 in | Wt 132.0 lb

## 2021-12-17 DIAGNOSIS — Z780 Asymptomatic menopausal state: Secondary | ICD-10-CM

## 2021-12-17 DIAGNOSIS — N952 Postmenopausal atrophic vaginitis: Secondary | ICD-10-CM

## 2021-12-17 DIAGNOSIS — Z9189 Other specified personal risk factors, not elsewhere classified: Secondary | ICD-10-CM | POA: Diagnosis not present

## 2021-12-17 DIAGNOSIS — L292 Pruritus vulvae: Secondary | ICD-10-CM | POA: Diagnosis not present

## 2021-12-17 DIAGNOSIS — Z1231 Encounter for screening mammogram for malignant neoplasm of breast: Secondary | ICD-10-CM

## 2021-12-17 DIAGNOSIS — M8580 Other specified disorders of bone density and structure, unspecified site: Secondary | ICD-10-CM | POA: Diagnosis not present

## 2021-12-17 DIAGNOSIS — M8588 Other specified disorders of bone density and structure, other site: Secondary | ICD-10-CM

## 2021-12-17 DIAGNOSIS — Z01419 Encounter for gynecological examination (general) (routine) without abnormal findings: Secondary | ICD-10-CM | POA: Diagnosis not present

## 2021-12-17 LAB — WET PREP FOR TRICH, YEAST, CLUE

## 2021-12-17 NOTE — Telephone Encounter (Signed)
Patient calling in  Patient is requesting provider place order for bone density test.. patient says last one was around 2 years ago  Please let patient know when order has been placed [phone]

## 2021-12-17 NOTE — Progress Notes (Signed)
Deborah Barton June 27, 1946 [id number]   History:  75 y.o. G2P2 presents for breast and pelvic exam. Postmenopausal - no HRT. S/P 1994 TAH for fibroids and endometriosis. 2018 ASCUS negative HPV, otherwise normal history. Complains of intermittent vulvar itching without discharge or odor. Has been using OTC monistat as needed and feels the symptoms improve and then come back.   Gynecologic History No LMP recorded. Patient has had a hysterectomy.   Contraception: status post hysterectomy Sexually active:Yes  Health maintenance Last Pap: 11/12/2019. Results were: Normal Last mammogram: 01/11/2020. Results were: Normal Last colonoscopy: 01/30/2021. Results were: Tubular adenomas, 5-year recall Last Dexa: 12/04/2019. Results were: t-score -1.4, FRAX 6.3% / 3.0%  Past medical history, past surgical history, family history and social history were all reviewed and documented in the EPIC chart. 74 year old son, has 53 yo son. 52 yo son. Patient keeps grandson 5 days a week. MGM and maternal aunt with history of ovarian cancer.   ROS:  A ROS was performed and pertinent positives and negatives are included.  Exam:  Vitals:   12/17/21 1139  BP: 130/82  Weight: 132 lb (59.9 kg)  Height: 5\' 1"  (1.549 m)    Body mass index is 24.94 kg/m.  General appearance:  Normal Thyroid:  Symmetrical, normal in size, without palpable masses or nodularity. Respiratory  Auscultation:  Clear without wheezing or rhonchi Cardiovascular  Auscultation:  Regular rate, without rubs, murmurs or gallops  Edema/varicosities:  Not grossly evident Abdominal  Soft,nontender, without masses, guarding or rebound.  Liver/spleen:  No organomegaly noted  Hernia:  None appreciated  Skin  Inspection:  Grossly normal   Breasts: Examined lying and sitting.   Right: Without masses, retractions, discharge or axillary adenopathy.   Left: Without masses, retractions, discharge or axillary adenopathy. Genitourinary    Inguinal/mons:  Normal without inguinal adenopathy  External genitalia:  Normal appearing vulva with no masses, tenderness, or lesions  BUS/Urethra/Skene's glands:  Normal  Vagina:  Normal appearing with normal color and discharge, no lesions. Atrophic changes  Cervix:  Absent  Uterus:  Absent  Adnexa/parametria:     Rt: Normal in size, without masses or tenderness.   Lt: Normal in size, without masses or tenderness.  Anus and perineum: Normal  Digital rectal exam: Normal sphincter tone without palpated masses or tenderness  Patient informed chaperone available to be present for breast and pelvic exam. Patient has requested no chaperone to be present. Patient has been advised what will be completed during breast and pelvic exam.   Wet prep negative  Assessment/Plan:  75 y.o. G2P2 for breast and pelvic exam.   Well female exam with routine gynecological exam - Education provided on SBEs, importance of preventative screenings, current guidelines, high calcium diet, regular exercise, and multivitamin daily. Labs with PCP.   Postmenopausal - no HRT, no bleeding.   Osteopenia, unspecified location - T-score -1.7 without elevated FRAX. Recommend repeating this soon and she will call PCP as she has these done at their office. Continue Vitamin D + Calcium and regular exercise.   Vulvar itching - Plan: WET PREP FOR [place], YEAST, CLUE. Wet prep negative  Post-menopausal atrophic vaginitis - Discussed changes that occur with menopause and management options. She has some mild pain with intercourse as well. Recommend using coconut oil for daily hydration and for intercourse.   Screening for cervical cancer - No significant abnormal pap history. No longer screening per guidelines.   Screening for breast cancer - Normal mammogram history. Overdue and  she did not realize. She will call today to schedule. Normal breast exam today.   Screening for colon cancer - 2022 colonoscopy - tubular adenomas.  Will repeat at 5-year interval per GI recommendation.   Follow up in 2 years for breast and pelvic exam.      Tamela Gammon [facility], 11:49 AM 12/17/2021

## 2021-12-24 ENCOUNTER — Ambulatory Visit: Payer: Medicare Other

## 2021-12-24 NOTE — Telephone Encounter (Signed)
Order placed please schedule.

## 2021-12-31 ENCOUNTER — Telehealth: Payer: Self-pay | Admitting: Internal Medicine

## 2021-12-31 NOTE — Telephone Encounter (Signed)
1.Medication Requested: fluticasone (FLONASE) 50 MCG/ACT nasal spray 2. Pharmacy (Name, Street, [place]): [facility] ([place], [address] Phone:  [phone]  Fax:  [phone]     3. On Med List: Y  4. Last Visit with PCP:  5. Next visit date with PCP:   Agent: Please be advised that RX refills may take up to 3 business days. We ask that you follow-up with your pharmacy.

## 2022-01-01 MED ORDER — FLUTICASONE PROPIONATE 50 MCG/ACT NA SUSP: 2.0000 | Freq: Every day | NASAL | 3 refills | Status: DC

## 2022-01-01 NOTE — Telephone Encounter (Signed)
Medication has been sent to the patient's pharmacy.  

## 2022-01-11 ENCOUNTER — Ambulatory Visit (INDEPENDENT_AMBULATORY_CARE_PROVIDER_SITE_OTHER)
Admission: RE | Admit: 2022-01-11 | Discharge: 2022-01-11 | Disposition: A | Discharge status: Home / Self Care | Payer: Medicare Other | Source: Ambulatory Visit | Attending: Internal Medicine | Admitting: Internal Medicine

## 2022-01-11 ENCOUNTER — Other Ambulatory Visit: Payer: Self-pay

## 2022-01-11 DIAGNOSIS — M8588 Other specified disorders of bone density and structure, other site: Secondary | ICD-10-CM

## 2022-01-14 ENCOUNTER — Telehealth: Payer: Self-pay | Admitting: Internal Medicine

## 2022-01-14 ENCOUNTER — Ambulatory Visit: Payer: Medicare Other

## 2022-01-14 NOTE — Telephone Encounter (Signed)
Patient called stating she needs her last lab records mailed to her.

## 2022-01-15 NOTE — Telephone Encounter (Signed)
Copy of pt's recent lab work has been placed in the mail.

## 2022-01-18 ENCOUNTER — Ambulatory Visit: Payer: Medicare Other

## 2022-02-11 ENCOUNTER — Ambulatory Visit
Admission: RE | Admit: 2022-02-11 | Discharge: 2022-02-11 | Disposition: A | Discharge status: Home / Self Care | Payer: Medicare Other | Source: Ambulatory Visit | Attending: Internal Medicine | Admitting: Internal Medicine

## 2022-02-11 DIAGNOSIS — Z1231 Encounter for screening mammogram for malignant neoplasm of breast: Secondary | ICD-10-CM

## 2022-03-17 ENCOUNTER — Telehealth: Payer: Self-pay | Admitting: Internal Medicine

## 2022-03-17 NOTE — Telephone Encounter (Signed)
Pt states she has nasal congestion, mild throat irritation, and a headache since 03-14-2022 ? ?Pt has taken otc mucinex cold and flu and nyquil ? ?Pt took a covid test and it was neg ? ?Pt requesting a cb w/ for other otc med recommendations  ?

## 2022-03-17 NOTE — Telephone Encounter (Signed)
Can try zyrtec or flonase as well to help  ?

## 2022-03-17 NOTE — Telephone Encounter (Signed)
Called pt. LDVM at [phone] with Dr. Nathanial Millman recommendations. Office number was provided. ?

## 2022-04-30 ENCOUNTER — Other Ambulatory Visit: Payer: Self-pay | Admitting: Internal Medicine

## 2022-07-06 ENCOUNTER — Encounter: Payer: Self-pay | Admitting: Internal Medicine

## 2022-07-06 ENCOUNTER — Ambulatory Visit (INDEPENDENT_AMBULATORY_CARE_PROVIDER_SITE_OTHER): Payer: Medicare Other | Admitting: Internal Medicine

## 2022-07-06 VITALS — BP 122/84 | HR 88 | Resp 18 | Ht 61.0 in | Wt 135.6 lb

## 2022-07-06 DIAGNOSIS — G8929 Other chronic pain: Secondary | ICD-10-CM | POA: Diagnosis not present

## 2022-07-06 DIAGNOSIS — F411 Generalized anxiety disorder: Secondary | ICD-10-CM

## 2022-07-06 DIAGNOSIS — H93A9 Pulsatile tinnitus, unspecified ear: Secondary | ICD-10-CM | POA: Diagnosis not present

## 2022-07-06 DIAGNOSIS — H908 Mixed conductive and sensorineural hearing loss, unspecified: Secondary | ICD-10-CM

## 2022-07-06 DIAGNOSIS — R194 Change in bowel habit: Secondary | ICD-10-CM | POA: Diagnosis not present

## 2022-07-06 DIAGNOSIS — M25512 Pain in left shoulder: Secondary | ICD-10-CM

## 2022-07-06 MED ORDER — LORAZEPAM 1 MG PO TABS: 1.0000 mg | ORAL_TABLET | Freq: Every evening | ORAL | 5 refills | Status: DC | PRN

## 2022-07-06 MED ORDER — PREDNISONE 20 MG PO TABS: 40.0000 mg | ORAL_TABLET | Freq: Every day | ORAL | 0 refills | Status: DC

## 2022-07-06 MED ORDER — PANTOPRAZOLE SODIUM 40 MG PO TBEC: 40.0000 mg | DELAYED_RELEASE_TABLET | Freq: Every day | ORAL | 3 refills | Status: DC

## 2022-07-06 NOTE — Patient Instructions (Addendum)
We will try prednisone 2 pills daily for 5 days for the shoulder.   We have sent in an acid medicine called protonix to take 1 pill daily until we figure out what is going on with the stomach.   We will get the scan of the stomach. We will get you in with the audiologist.

## 2022-07-06 NOTE — Progress Notes (Signed)
   Subjective:   Patient ID: Deborah Barton Age, female    DOB: 20-Nov-1946, 76 y.o.   MRN: [id number]  HPI The patient is a 76 YO female coming in for concerns.  Review of Systems  Objective:  Physical Exam  Vitals:   07/06/22 1358  BP: 122/84  Pulse: 88  Resp: 18  SpO2: 99%  Weight: 135 lb 9.6 oz (61.5 kg)  Height: '5\' 1"'$  (1.549 m)    Assessment & Plan:

## 2022-07-07 DIAGNOSIS — M25512 Pain in left shoulder: Secondary | ICD-10-CM | POA: Insufficient documentation

## 2022-07-07 DIAGNOSIS — H93A9 Pulsatile tinnitus, unspecified ear: Secondary | ICD-10-CM | POA: Insufficient documentation

## 2022-07-07 DIAGNOSIS — R194 Change in bowel habit: Secondary | ICD-10-CM | POA: Insufficient documentation

## 2022-07-07 NOTE — Assessment & Plan Note (Signed)
Ongoing 3 months since lifting something. Some limitation of ROM. Rx prednisone 5 day course and if no improvement referral to sports medicine.

## 2022-07-07 NOTE — Assessment & Plan Note (Deleted)
Referral to audiology for assessment.

## 2022-07-07 NOTE — Assessment & Plan Note (Addendum)
Up to date on colonoscopy but concerning with new abdominal pain and change in bowels in the last few weeks. Ordered CT abdomen and pelvis to assess for obstruction or etiology. Encouraged to use fiber and otc for constipation in the meantime. Started protonix 40 mg daily as pain is midline upper abdomen to cover for GERD/PUD.

## 2022-07-07 NOTE — Assessment & Plan Note (Signed)
Referral to audiology for assessment.

## 2022-07-07 NOTE — Assessment & Plan Note (Signed)
Referral to audiology.

## 2022-07-07 NOTE — Assessment & Plan Note (Signed)
Using cymbalta 60 mg daily and ativan 1 mg qhs prn and this is controlled. We will keep regimen the same for now.

## 2022-07-26 ENCOUNTER — Ambulatory Visit: Payer: Medicare Other | Attending: Audiology | Admitting: Audiology

## 2022-07-26 DIAGNOSIS — H903 Sensorineural hearing loss, bilateral: Secondary | ICD-10-CM | POA: Diagnosis not present

## 2022-07-26 DIAGNOSIS — H9312 Tinnitus, left ear: Secondary | ICD-10-CM | POA: Diagnosis not present

## 2022-07-26 NOTE — Procedures (Signed)
  Outpatient Audiology and [place], [place]  [id number] [phone]  AUDIOLOGICAL  EVALUATION  NAME: Deborah Barton     DOB:   1946/07/01      MRN: [id number]                                                                                     DATE: 07/26/2022     REFERENT: Hoyt Koch, MD STATUS: Outpatient DIAGNOSIS: Tinnitus, Sensorineural hearing loss, bilateral   History: Leonore was seen for an audiological evaluation due to pulsatile left tinnitus occurring for 1.5 years. Arthea reports tinnitus is only in the left ear and is very bothersome at night. Lanaysia denies otalgia, aural fullness, and dizziness. Aleshka has been seen at [facility] for her left pulsatile tinnitus. Shunte had an audiometric evaluation completed on 05/13/21 at [facility] at which time showed normal hearing sensitivity from [phone] Hz sloping to a mild to moderate sensorineural hearing loss. Brandye reports she was previously referred to [facility] however did not follow up with the evaluation. Emalea denies changes in her hearing sensitivity since her last evaluation.   Evaluation:  Otoscopy showed a clear view of the tympanic membranes, bilaterally Tympanometry results were consistent with normal middle ear pressure and normal tympanic membrane mobility (Type A), bilaterally.  Audiometric testing was completed using Conventional Audiometry techniques with insert earphones and TDH headphones. Test results are consistent in the right ear with a mild to moderate to moderately-severe sensorineural hearing loss and consistent in the left ear with normal hearing sensitivity to a moderate to moderately-severe sensorineural hearing loss. Speech Recognition Thresholds were obtained at 30 dB HL in the right ear and at 25  dB HL in the left ear. Word Recognition Testing was completed at 70 dB HL and Lincoln scored 96% in the  right ear and 100% in the left ear.    Results:  Today's results are consistent in the right ear with a mild to moderate to moderately-severe sensorineural hearing loss and consistent in the left ear with normal hearing sensitivity to a moderate to moderately-severe sensorineural hearing loss. Fleeta's hearing is stable since her last evaluation on 05/13/2021. Kamauri will have communication difficulty in many listening situations and will benefit from the use of good communication strategies. Tinnitus management strategies were reviewed. Alaiza was given a packet of information on tinnitus management strategies. The test results were reviewed with Aerianna.   Recommendations: Follow up with [facility] [place] if Tinnitus Worsens Use of tinnitus management strategies, including use of sound machine or Bose Sleep Plugs at night Monitor hearing sensitivity. Return in 2-3 years for a repeat audiological evaluation.     30 minutes spent testing and counseling on results.   If you have any questions please feel free to contact me at [phone].  Bari Mantis Audiologist, Au.D., CCC-A 07/26/2022  2:11 PM  Cc: Hoyt Koch, MD

## 2022-08-04 ENCOUNTER — Ambulatory Visit
Admission: RE | Admit: 2022-08-04 | Discharge: 2022-08-04 | Disposition: A | Discharge status: Home / Self Care | Payer: Medicare Other | Source: Ambulatory Visit | Attending: Internal Medicine | Admitting: Internal Medicine

## 2022-08-04 DIAGNOSIS — R194 Change in bowel habit: Secondary | ICD-10-CM

## 2022-08-04 DIAGNOSIS — R109 Unspecified abdominal pain: Secondary | ICD-10-CM | POA: Diagnosis not present

## 2022-08-10 ENCOUNTER — Telehealth: Payer: Self-pay | Admitting: Internal Medicine

## 2022-08-10 NOTE — Telephone Encounter (Signed)
Pt is requesting a call to discuss the results of her CT Abdomen Pelvis wo contrast that was done on 08/05/22. Pt stated she did not really understand the results. She said she is unsure if there is anything she is supposed to do or if there is any medications she is supposed to begin taking.   Please advise

## 2022-08-10 NOTE — Telephone Encounter (Signed)
Spoke with patient today and went over results.

## 2022-10-14 ENCOUNTER — Telehealth: Payer: Self-pay

## 2022-10-14 NOTE — Telephone Encounter (Signed)
MEDICATION: cyclobenzaprine (FLEXERIL) 5 MG tablet //predniSONE (DELTASONE) 20 MG tablet   PHARMACY: [facility] [place] - [address]  Comments: Patient needs new prescription // Was prescribed for arm pain and still is having the pain. - [place] leaves to go out [place] and is need of these prescriptions.   **Let patient know to contact pharmacy at the end of the day to make sure medication is ready. **  ** Please notify patient to allow 48-72 hours to process**  **Encourage patient to contact the pharmacy for refills or they can request refills through [facility]**

## 2022-10-20 NOTE — Telephone Encounter (Signed)
Pt called back checking status of refill request for cyclobenzaprine. Pt says she talked about it with provider at last appt on 07/06/22.   Cyclobenzaprine 5 mg take 3 times daily  Last written: 10/05/21 90 tablets 5 refills  Preferred pharmacy: [facility] [place] - [address]  Please call pt with update [phone]

## 2022-10-20 NOTE — Telephone Encounter (Signed)
Patient states she never received call back regarding refill. Patient needs this medication sent in as soon as possible as she is going out of town in a few days and she is out. Please send to local pharmacy, not mail order: [facility] Barton), [place] - [address]  Please call patient at [phone] when this has been done.

## 2022-10-21 MED ORDER — CYCLOBENZAPRINE HCL 5 MG PO TABS
5.0000 mg | ORAL_TABLET | Freq: Three times a day (TID) | ORAL | 5 refills | Status: DC | PRN
Start: 2022-10-21 — End: 2022-12-08

## 2022-10-21 NOTE — Telephone Encounter (Signed)
Refilled

## 2022-10-21 NOTE — Telephone Encounter (Signed)
Please advise 

## 2022-11-01 DIAGNOSIS — H35373 Puckering of macula, bilateral: Secondary | ICD-10-CM | POA: Diagnosis not present

## 2022-11-01 DIAGNOSIS — H35362 Drusen (degenerative) of macula, left eye: Secondary | ICD-10-CM | POA: Diagnosis not present

## 2022-11-01 DIAGNOSIS — H524 Presbyopia: Secondary | ICD-10-CM | POA: Diagnosis not present

## 2022-11-01 DIAGNOSIS — H35033 Hypertensive retinopathy, bilateral: Secondary | ICD-10-CM | POA: Diagnosis not present

## 2022-11-01 DIAGNOSIS — H40023 Open angle with borderline findings, high risk, bilateral: Secondary | ICD-10-CM | POA: Diagnosis not present

## 2022-11-01 LAB — HM DIABETES EYE EXAM

## 2022-12-08 ENCOUNTER — Ambulatory Visit (INDEPENDENT_AMBULATORY_CARE_PROVIDER_SITE_OTHER): Payer: Medicare Other | Admitting: Internal Medicine

## 2022-12-08 ENCOUNTER — Encounter: Payer: Self-pay | Admitting: Internal Medicine

## 2022-12-08 ENCOUNTER — Ambulatory Visit (INDEPENDENT_AMBULATORY_CARE_PROVIDER_SITE_OTHER): Payer: Medicare Other

## 2022-12-08 VITALS — BP 160/70 | HR 89 | Temp 97.4°F | Ht 61.0 in | Wt 135.4 lb

## 2022-12-08 VITALS — BP 150/78 | HR 89 | Temp 97.4°F | Ht 61.0 in | Wt 135.0 lb

## 2022-12-08 DIAGNOSIS — Z0001 Encounter for general adult medical examination with abnormal findings: Secondary | ICD-10-CM | POA: Diagnosis not present

## 2022-12-08 DIAGNOSIS — I10 Essential (primary) hypertension: Secondary | ICD-10-CM | POA: Diagnosis not present

## 2022-12-08 DIAGNOSIS — Z0000 Encounter for general adult medical examination without abnormal findings: Secondary | ICD-10-CM

## 2022-12-08 DIAGNOSIS — F411 Generalized anxiety disorder: Secondary | ICD-10-CM

## 2022-12-08 DIAGNOSIS — M25512 Pain in left shoulder: Secondary | ICD-10-CM | POA: Diagnosis not present

## 2022-12-08 LAB — CBC
HCT: 37.2 % (ref 36.0–46.0)
Hemoglobin: 12 g/dL (ref 12.0–15.0)
MCHC: 32.1 g/dL (ref 30.0–36.0)
MCV: 73 fl — ABNORMAL LOW (ref 78.0–100.0)
Platelets: 326 10*3/uL (ref 150.0–400.0)
RBC: 5.1 Mil/uL (ref 3.87–5.11)
RDW: 16.3 % — ABNORMAL HIGH (ref 11.5–15.5)
WBC: 6.9 10*3/uL (ref 4.0–10.5)

## 2022-12-08 LAB — URINALYSIS, ROUTINE W REFLEX MICROSCOPIC
Bilirubin Urine: NEGATIVE
Hgb urine dipstick: NEGATIVE
Ketones, ur: NEGATIVE
Leukocytes,Ua: NEGATIVE
Nitrite: NEGATIVE
RBC / HPF: NONE SEEN (ref 0–?)
Specific Gravity, Urine: 1.01 (ref 1.000–1.030)
Total Protein, Urine: NEGATIVE
Urine Glucose: NEGATIVE
Urobilinogen, UA: 0.2 (ref 0.0–1.0)
pH: 6 (ref 5.0–8.0)

## 2022-12-08 LAB — COMPREHENSIVE METABOLIC PANEL
ALT: 16 U/L (ref 0–35)
AST: 22 U/L (ref 0–37)
Albumin: 4.6 g/dL (ref 3.5–5.2)
Alkaline Phosphatase: 105 U/L (ref 39–117)
BUN: 14 mg/dL (ref 6–23)
CO2: 32 mEq/L (ref 19–32)
Calcium: 10.5 mg/dL (ref 8.4–10.5)
Chloride: 100 mEq/L (ref 96–112)
Creatinine, Ser: 0.77 mg/dL (ref 0.40–1.20)
GFR: 75.68 mL/min (ref >60.00)
Glucose, Bld: 73 mg/dL (ref 70–99)
Potassium: 4.1 mEq/L (ref 3.5–5.1)
Sodium: 138 mEq/L (ref 135–145)
Total Bilirubin: 0.5 mg/dL (ref 0.2–1.2)
Total Protein: 7.9 g/dL (ref 6.0–8.3)

## 2022-12-08 LAB — LIPID PANEL
Cholesterol: 228 mg/dL — ABNORMAL HIGH (ref 0–200)
HDL: 74.5 mg/dL (ref >39.00)
LDL Cholesterol: 137 mg/dL — ABNORMAL HIGH (ref 0–99)
NonHDL: 153.91
Total CHOL/HDL Ratio: 3
Triglycerides: 84 mg/dL (ref 0.0–149.0)
VLDL: 16.8 mg/dL (ref 0.0–40.0)

## 2022-12-08 MED ORDER — DULOXETINE HCL 60 MG PO CPEP: 60.0000 mg | ORAL_CAPSULE | Freq: Every day | ORAL | 3 refills | Status: DC

## 2022-12-08 MED ORDER — PREDNISONE 20 MG PO TABS: 40.0000 mg | ORAL_TABLET | Freq: Every day | ORAL | 0 refills | Status: DC

## 2022-12-08 MED ORDER — CYCLOBENZAPRINE HCL 5 MG PO TABS: 5.0000 mg | ORAL_TABLET | Freq: Three times a day (TID) | ORAL | 5 refills | Status: DC | PRN

## 2022-12-08 MED ORDER — PANTOPRAZOLE SODIUM 40 MG PO TBEC: 40.0000 mg | DELAYED_RELEASE_TABLET | Freq: Every day | ORAL | 3 refills | Status: DC

## 2022-12-08 NOTE — Progress Notes (Unsigned)
   Subjective:   Patient ID: Deborah Barton, female    DOB: 04/30/1946, 76 y.o.   MRN: [id number]  HPI The patient is here for physical.  PMH, [facility], social history reviewed and updated  Review of Systems  Objective:  Physical Exam  Vitals:   12/08/22 1035 12/08/22 1036 12/08/22 1126  BP: (!) 150/80 (!) 160/70 (!) 150/78  Pulse: 89    Temp: (!) 97.4 F (36.3 C)    TempSrc: Oral    SpO2: 92%    Weight: 135 lb (61.2 kg)    Height: '5\' 1"'$  (1.549 m)     EKG: Rate 69, axis normal, interval normal, sinus, no st or t wave changes, no significant change compared to prior 2022  Assessment & Plan:

## 2022-12-08 NOTE — Patient Instructions (Signed)
We are checking the labs today. The EKG is normal.

## 2022-12-08 NOTE — Progress Notes (Signed)
Subjective:   Deborah Barton is a 76 y.o. female who presents for Medicare Annual (Subsequent) preventive examination.  Review of Systems     Cardiac Risk Factors include: advanced age (>3mn, >>68women);hypertension     Objective:    Today's Vitals   12/08/22 1001 12/08/22 1027  BP: (!) 150/80 (!) 160/70  Pulse: 89   Temp: (!) 97.4 F (36.3 C)   SpO2: 93%   Weight: 135 lb 6.4 oz (61.4 kg)   Height: '5\' 1"'$  (1.549 m)   PainSc: 5     Body mass index is 25.58 kg/m.     12/08/2022   10:17 AM 12/22/2019    3:56 AM 07/10/2018    4:49 PM 01/28/2018   12:00 PM 10/08/2017    3:33 PM 04/19/2017    2:51 PM 11/12/2015    1:40 PM  Advanced Directives  Does Patient Have a Medical Advance Directive? No No No No No No No  Would patient like information on creating a medical advance directive? No - Patient declined  Yes (MAU/Ambulatory/Procedural Areas - Information given) No - Patient declined No - Patient declined No - Patient declined     Current Medications (verified) Outpatient Encounter Medications as of 12/08/2022  Medication Sig   acetaminophen (TYLENOL) 500 MG tablet Take 1,000 mg by mouth every 6 (six) hours as needed for moderate pain.   Ascorbic Acid (VITAMIN C) 1000 MG tablet Take 1,000 mg by mouth daily.   cholecalciferol (VITAMIN D3) 25 MCG (1000 UT) tablet Take 1,000 Units by mouth daily.   colchicine 0.6 MG tablet Take 1 tablet (0.6 mg total) by mouth daily as needed (knee pain and swelling due to pseudogout).   cyclobenzaprine (FLEXERIL) 5 MG tablet Take 1 tablet (5 mg total) by mouth 3 (three) times daily as needed for muscle spasms.   DULoxetine (CYMBALTA) 60 MG capsule TAKE 1 CAPSULE BY MOUTH  DAILY   fluticasone (FLONASE) 50 MCG/ACT nasal spray Place 2 sprays into both nostrils daily.   hydroxypropyl methylcellulose / hypromellose (ISOPTO TEARS / GONIOVISC) 2.5 % ophthalmic solution Place 1 drop into both eyes as needed for dry eyes.   LORazepam (ATIVAN)  1 MG tablet Take 1 tablet (1 mg total) by mouth at bedtime as needed for anxiety.   Multiple Vitamin (MULTIVITAMIN WITH MINERALS) TABS tablet Take 1 tablet by mouth daily. Adult 50+ vit.   pantoprazole (PROTONIX) 40 MG tablet Take 1 tablet (40 mg total) by mouth daily.   predniSONE (DELTASONE) 20 MG tablet Take 2 tablets (40 mg total) by mouth daily with breakfast.   vitamin B-12 (CYANOCOBALAMIN) 500 MCG tablet Take 500 mcg by mouth daily.   triamcinolone ointment (KENALOG) 0.1 % Apply 1 application topically 4 (four) times daily. On chapped lips   No facility-administered encounter medications on file as of 12/08/2022.    Allergies (verified) Patient has no known allergies.   History: Past Medical History:  Diagnosis Date   Acid reflux    hx of   Anemia    hx of-   Anxiety    PRN meds   Arthritis    RIGHT knee   Cataract    bilateral sx   Hypertension    situational - not taking med   Neuropathy    Past Surgical History:  Procedure Laterality Date   ABDOMINAL HYSTERECTOMY  1994   CHOLECYSTECTOMY  1975   Family History  Problem Relation Age of Onset   Anemia Other    Arthritis  Other    Asthma Other    Depression Other    Hypertension Other    Stroke Other    Uterine cancer Sister 31   Ovarian cancer Maternal Aunt 60   Ovarian cancer Maternal Grandmother 61   Breast cancer Paternal Grandmother    Colon cancer Neg Hx    Esophageal cancer Neg Hx    Stomach cancer Neg Hx    Rectal cancer Neg Hx    Colon polyps Neg Hx    Social History   Socioeconomic History   Marital status: Divorced    Spouse name: Not on file   Number of children: Not on file   Years of education: Not on file   Highest education level: Not on file  Occupational History   Not on file  Tobacco Use   Smoking status: Never   Smokeless tobacco: Never  Vaping Use   Vaping Use: Never used  Substance and Sexual Activity   Alcohol use: Yes    Alcohol/week: 3.0 standard drinks of alcohol     Types: 3 Standard drinks or equivalent per week    Comment: occasionally   Drug use: No   Sexual activity: Yes    Partners: Male    Comment: 1st intercourse- 2, partners- 27, current partner- 46 yrs   Other Topics Concern   Not on file  Social History Narrative   Not on file   Social Determinants of Health   Financial Resource Strain: Low Risk  (12/08/2022)   Overall Financial Resource Strain (CARDIA)    Difficulty of Paying Living Expenses: Not hard at all  Food Insecurity: No Food Insecurity (12/08/2022)   Hunger Vital Sign    Worried About Running Out of Food in the Last Year: Never true    [place] in the Last Year: Never true  Transportation Needs: No Transportation Needs (12/08/2022)   PRAPARE - Hydrologist (Medical): No    Lack of Transportation (Non-Medical): No  Physical Activity: Sufficiently Active (12/08/2022)   Exercise Vital Sign    Days of Exercise per Week: 7 days    Minutes of Exercise per Session: 30 min  Stress: No Stress Concern Present (12/08/2022)   [place]    Feeling of Stress : Not at all  Social Connections: [place] (12/08/2022)   Social Connection and Isolation Panel [NHANES]    Frequency of Communication with Friends and Family: More than three times a week    Frequency of Social Gatherings with Friends and Family: More than three times a week    Attends Religious Services: More than 4 times per year    Active Member of [employer] or Organizations: Yes    Attends Music therapist: More than 4 times per year    Marital Status: Married    Tobacco Counseling Counseling given: Not Answered   Clinical Intake:  Pre-visit preparation completed: Yes  Pain : 0-10 Pain Score: 5  Pain Type: Acute pain (due to fall) Pain Location: Knee Pain Orientation: Right     BMI - recorded: 25.58 Nutritional Status: BMI 25 -29  Overweight Nutritional Risks: None Diabetes: No  How often do you need to have someone help you when you read instructions, pamphlets, or other written materials from your doctor or pharmacy?: 1 - Never What is the last grade level you completed in school?: HSG; 1 year of college  Diabetic? No  Interpreter Needed?:  No  Information entered by :: Lisette Abu, LPN.   Activities of Daily Living    12/08/2022   10:03 AM  In your present state of health, do you have any difficulty performing the following activities:  Hearing? 1  Comment ringing in the left ear  Vision? 0  Difficulty concentrating or making decisions? 0  Walking or climbing stairs? 0  Dressing or bathing? 0  Doing errands, shopping? 0  Preparing Food and eating ? N  Using the Toilet? N  In the past six months, have you accidently leaked urine? N  Do you have problems with loss of bowel control? N  Managing your Medications? N  Managing your Finances? N  Housekeeping or managing your Housekeeping? N    Patient Care Team: Hoyt Koch, MD as PCP - General (Internal Medicine) Szabat, Darnelle Maffucci, [facility] (Inactive) as Pharmacist (Pharmacist)  Indicate any recent Medical Services you may have received from other than [facility] providers in the past year (date may be approximate).     Assessment:   This is a routine wellness examination for Deborah Barton.  Hearing/Vision screen Hearing Screening - Comments:: No hearing aids.  Ringing in the left ear. Vision Screening - Comments:: Wears rx glasses - up to date with routine eye exams with Monna Fam, MD.   Dietary issues and exercise activities discussed: Current Exercise Habits: Home exercise routine;Structured exercise class, Type of exercise: walking;treadmill;stretching;strength training/weights;exercise ball;calisthenics, Time (Minutes): 30, Frequency (Times/Week): 7, Weekly Exercise (Minutes/Week): 210, Intensity: Moderate, Exercise limited by:  orthopedic condition(s)   Goals Addressed             This Visit's Progress    My goal for 2024 is to stay healthy, keep exercising, read more and take care of myself.        Depression Screen    12/08/2022   10:02 AM 07/06/2022    2:03 PM 12/07/2021   11:02 AM 12/03/2020   10:54 AM 12/03/2019   10:20 AM 11/30/2018    9:47 AM 11/23/2017    8:07 AM  PHQ 2/9 Scores  PHQ - 2 Score 0 0 0 0 1 0 0  PHQ- 9 Score  0         Fall Risk    12/08/2022   10:03 AM 07/06/2022    2:03 PM 12/07/2021   11:04 AM 12/03/2020   10:54 AM 12/03/2019   10:19 AM  Fall Risk   Falls in the past year? 1 0 0 0 0  Number falls in past yr: 0 0 0 0   Injury with Fall? 0 0 0 0   Risk for fall due to : No Fall Risks      Follow up Falls prevention discussed        FALL RISK PREVENTION PERTAINING TO THE HOME:  Any stairs in or around the home? No  If so, are there any without handrails? No  Home free of loose throw rugs in walkways, pet beds, electrical cords, etc? Yes  Adequate lighting in your home to reduce risk of falls? Yes   ASSISTIVE DEVICES UTILIZED TO PREVENT FALLS:  Life alert? No  Use of a cane, walker or w/c? No  Grab bars in the bathroom? No  Shower chair or bench in shower? No  Elevated toilet seat or a handicapped toilet? Yes   TIMED UP AND GO:  Was the test performed? Yes .  Length of time to ambulate 10 feet: 8 sec.   Gait steady  and fast without use of assistive device  Cognitive Function:        12/08/2022   10:03 AM  6CIT Screen  What Year? 0 points  What month? 0 points  What time? 0 points  Count back from 20 0 points  Months in reverse 0 points  Repeat phrase 0 points  Total Score 0 points    Immunizations Immunization History  Administered Date(s) Administered   COVID-19, mRNA, vaccine(Comirnaty)12 years and older 09/28/2022   Fluad Quad(high Dose 65+) 12/03/2020   Influenza Split 12/23/2011   Influenza Whole 12/17/2009, 12/16/2010   Influenza, High  Dose Seasonal PF 11/08/2016, 11/23/2017, 11/30/2018   Influenza, Seasonal, Injecte, Preservative Fre 01/04/2013   Influenza,inj,Quad PF,6+ Mos 09/23/2014, 11/03/2015   Influenza-Unspecified 11/11/2019, 09/15/2022   PFIZER(Purple Top)SARS-COV-2 Vaccination 03/21/2020, 04/11/2020, 10/15/2020, 04/16/2021, 09/17/2021   Pneumococcal Conjugate-13 04/02/2014   Pneumococcal Polysaccharide-23 01/04/2013   Respiratory Syncytial Virus Vaccine,Recomb Aduvanted(Arexvy) 09/28/2022   Td 12/09/2008   Tdap 12/03/2019   Zoster Recombinat (Shingrix) 03/30/2021, 06/01/2021    TDAP status: Up to date  Flu Vaccine status: Up to date  Pneumococcal vaccine status: Up to date  Covid-19 vaccine status: Completed vaccines  Qualifies for Shingles Vaccine? Yes   Zostavax completed No   Shingrix Completed?: Yes  Screening Tests Health Maintenance  Topic Date Due   Medicare Annual Wellness (AWV)  12/09/2023   COLONOSCOPY (Pts 45-65yr Insurance coverage will need to be confirmed)  01/30/2026   DTaP/Tdap/Td (3 - Td or Tdap) 12/02/2029   Pneumonia Vaccine 75 Years old  Completed   INFLUENZA VACCINE  Completed   DEXA SCAN  Completed   COVID-19 Vaccine  Completed   Hepatitis C Screening  Completed   Zoster Vaccines- Shingrix  Completed   HPV VACCINES  Aged Out    Health Maintenance  There are no preventive care reminders to display for this patient.   Colorectal cancer screening: Type of screening: Colonoscopy. Completed 01/30/2021. Repeat every 5 years  Mammogram status: Completed 02/11/2022. Repeat every year  Bone Density status: Completed 01/11/2022. Results reflect: Bone density results: OSTEOPENIA. Repeat every 2-3 years.  Lung Cancer Screening: (Low Dose CT Chest recommended if Age 75-80years, 30 pack-year currently smoking OR have quit w/in 15years.) does not qualify.   Lung Cancer Screening Referral: no  Additional Screening:  Hepatitis C Screening: does qualify; Completed  04/02/2014  Vision Screening: Recommended annual ophthalmology exams for early detection of glaucoma and other disorders of the eye. Is the patient up to date with their annual eye exam?  Yes  Who is the provider or what is the name of the office in which the patient attends annual eye exams? KMonna Fam MD. If pt is not established with a provider, would they like to be referred to a provider to establish care? No .   Dental Screening: Recommended annual dental exams for proper oral hygiene  Community Resource Referral / Chronic Care Management: CRR required this visit?  No   CCM required this visit?  No      Plan:     I have personally reviewed and noted the following in the patient's chart:   Medical and social history Use of alcohol, tobacco or illicit drugs  Current medications and supplements including opioid prescriptions. Patient is not currently taking opioid prescriptions. Functional ability and status Nutritional status Physical activity Advanced directives List of other physicians Hospitalizations, surgeries, and ER visits in previous 12 months Vitals Screenings to include cognitive, depression, and falls Referrals and  appointments  In addition, I have reviewed and discussed with patient certain preventive protocols, quality metrics, and best practice recommendations. A written personalized care plan for preventive services as well as general preventive health recommendations were provided to patient.     Sheral Flow, LPN   15/18/3437   Nurse Notes:  Normal cognitive status assessed by direct observation by this Nurse Health Advisor. No abnormalities found.

## 2022-12-08 NOTE — Patient Instructions (Addendum)
Deborah Barton , Thank you for taking time to come for your Medicare Wellness Visit. I appreciate your ongoing commitment to your health goals. Please review the following plan we discussed and let me know if I can assist you in the future.   These are the goals we discussed:  Goals      My goal for 2024 is to stay healthy, keep exercising, read more and take care of myself.        This is a list of the screening recommended for you and due dates:  Health Maintenance  Topic Date Due   Medicare Annual Wellness Visit  12/09/2023   Colon Cancer Screening  01/30/2026   DTaP/Tdap/Td vaccine (3 - Td or Tdap) 12/02/2029   Pneumonia Vaccine  Completed   Flu Shot  Completed   DEXA scan (bone density measurement)  Completed   COVID-19 Vaccine  Completed   Hepatitis C Screening: USPSTF Recommendation to screen - Ages 50-79 yo.  Completed   Zoster (Shingles) Vaccine  Completed   HPV Vaccine  Aged Out    Advanced directives: No  Conditions/risks identified: Yes  Next appointment: Follow up in one year for your annual wellness visit.   Preventive Care 70 Years and Older, Female Preventive care refers to lifestyle choices and visits with your health care provider that can promote health and wellness. What does preventive care include? A yearly physical exam. This is also called an annual well check. Dental exams once or twice a year. Routine eye exams. Ask your health care provider how often you should have your eyes checked. Personal lifestyle choices, including: Daily care of your teeth and gums. Regular physical activity. Eating a healthy diet. Avoiding tobacco and drug use. Limiting alcohol use. Practicing safe sex. Taking low-dose aspirin every day. Taking vitamin and mineral supplements as recommended by your health care provider. What happens during an annual well check? The services and screenings done by your health care provider during your annual well check will depend on your  age, overall health, lifestyle risk factors, and family history of disease. Counseling  Your health care provider may ask you questions about your: Alcohol use. Tobacco use. Drug use. Emotional well-being. Home and relationship well-being. Sexual activity. Eating habits. History of falls. Memory and ability to understand (cognition). Work and work Statistician. Reproductive health. Screening  You may have the following tests or measurements: Height, weight, and BMI. Blood pressure. Lipid and cholesterol levels. These may be checked every 5 years, or more frequently if you are over 94 years old. Skin check. Lung cancer screening. You may have this screening every year starting at age 76 if you have a 30-pack-year history of smoking and currently smoke or have quit within the past 15 years. Fecal occult blood test (FOBT) of the stool. You may have this test every year starting at age 96. Flexible sigmoidoscopy or colonoscopy. You may have a sigmoidoscopy every 5 years or a colonoscopy every 10 years starting at age 70. Hepatitis C blood test. Hepatitis B blood test. Sexually transmitted disease (STD) testing. Diabetes screening. This is done by checking your blood sugar (glucose) after you have not eaten for a while (fasting). You may have this done every 1-3 years. Bone density scan. This is done to screen for osteoporosis. You may have this done starting at age 52. Mammogram. This may be done every 1-2 years. Talk to your health care provider about how often you should have regular mammograms. Talk with your health  care provider about your test results, treatment options, and if necessary, the need for more tests. Vaccines  Your health care provider may recommend certain vaccines, such as: Influenza vaccine. This is recommended every year. Tetanus, diphtheria, and acellular pertussis (Tdap, Td) vaccine. You may need a Td booster every 10 years. Zoster vaccine. You may need this after  age 27. Pneumococcal 13-valent conjugate (PCV13) vaccine. One dose is recommended after age 56. Pneumococcal polysaccharide (PPSV23) vaccine. One dose is recommended after age 63. Talk to your health care provider about which screenings and vaccines you need and how often you need them. This information is not intended to replace advice given to you by your health care provider. Make sure you discuss any questions you have with your health care provider. Document Released: 01/09/2016 Document Revised: 09/01/2016 Document Reviewed: 10/14/2015 Elsevier Interactive Patient Education  2017 [place] Prevention in the Home Falls can cause injuries. They can happen to people of all ages. There are many things you can do to make your home safe and to help prevent falls. What can I do on the outside of my home? Regularly fix the edges of walkways and driveways and fix any cracks. Remove anything that might make you trip as you walk through a door, such as a raised step or threshold. Trim any bushes or trees on the path to your home. Use bright outdoor lighting. Clear any walking paths of anything that might make someone trip, such as rocks or tools. Regularly check to see if handrails are loose or broken. Make sure that both sides of any steps have handrails. Any raised decks and porches should have guardrails on the edges. Have any leaves, snow, or ice cleared regularly. Use sand or salt on walking paths during winter. Clean up any spills in your garage right away. This includes oil or grease spills. What can I do in the bathroom? Use night lights. Install grab bars by the toilet and in the tub and shower. Do not use towel bars as grab bars. Use non-skid mats or decals in the tub or shower. If you need to sit down in the shower, use a plastic, non-slip stool. Keep the floor dry. Clean up any water that spills on the floor as soon as it happens. Remove soap buildup in the tub or shower  regularly. Attach bath mats securely with double-sided non-slip rug tape. Do not have throw rugs and other things on the floor that can make you trip. What can I do in the bedroom? Use night lights. Make sure that you have a light by your bed that is easy to reach. Do not use any sheets or blankets that are too big for your bed. They should not hang down onto the floor. Have a firm chair that has side arms. You can use this for support while you get dressed. Do not have throw rugs and other things on the floor that can make you trip. What can I do in the kitchen? Clean up any spills right away. Avoid walking on wet floors. Keep items that you use a lot in easy-to-reach places. If you need to reach something above you, use a strong step stool that has a grab bar. Keep electrical cords out of the way. Do not use floor polish or wax that makes floors slippery. If you must use wax, use non-skid floor wax. Do not have throw rugs and other things on the floor that can make you trip. What can  I do with my stairs? Do not leave any items on the stairs. Make sure that there are handrails on both sides of the stairs and use them. Fix handrails that are broken or loose. Make sure that handrails are as long as the stairways. Check any carpeting to make sure that it is firmly attached to the stairs. Fix any carpet that is loose or worn. Avoid having throw rugs at the top or bottom of the stairs. If you do have throw rugs, attach them to the floor with carpet tape. Make sure that you have a light switch at the top of the stairs and the bottom of the stairs. If you do not have them, ask someone to add them for you. What else can I do to help prevent falls? Wear shoes that: Do not have high heels. Have rubber bottoms. Are comfortable and fit you well. Are closed at the toe. Do not wear sandals. If you use a stepladder: Make sure that it is fully opened. Do not climb a closed stepladder. Make sure that  both sides of the stepladder are locked into place. Ask someone to hold it for you, if possible. Clearly mark and make sure that you can see: Any grab bars or handrails. First and last steps. Where the edge of each step is. Use tools that help you move around (mobility aids) if they are needed. These include: Canes. Walkers. Scooters. Crutches. Turn on the lights when you go into a dark area. Replace any light bulbs as soon as they burn out. Set up your furniture so you have a clear path. Avoid moving your furniture around. If any of your floors are uneven, fix them. If there are any pets around you, be aware of where they are. Review your medicines with your doctor. Some medicines can make you feel dizzy. This can increase your chance of falling. Ask your doctor what other things that you can do to help prevent falls. This information is not intended to replace advice given to you by your health care provider. Make sure you discuss any questions you have with your health care provider. Document Released: 10/09/2009 Document Revised: 05/20/2016 Document Reviewed: 01/17/2015 Elsevier Interactive Patient Education  2017 [employer].

## 2022-12-09 ENCOUNTER — Encounter: Payer: Self-pay | Admitting: Internal Medicine

## 2022-12-09 NOTE — Assessment & Plan Note (Signed)
Dietary lapses lately likely caused borderline BP today. She will monitor and let us know if still high. Previously all readings 120s/70s and at goal. Will not add medication today.

## 2022-12-09 NOTE — Assessment & Plan Note (Signed)
Flare of this rx prednisone and flexeril to help with pain.

## 2022-12-09 NOTE — Assessment & Plan Note (Signed)
Will refill lorazepam when due. Cymbalta 60 mg daily refilled today and still effective for symptoms. Will continue.

## 2022-12-09 NOTE — Assessment & Plan Note (Signed)
Flu shot up to date. Covid-19 up to date. Pneumonia complete. Shingrix complete. Tetanus up to date. Colonoscopy up to date. Mammogram up to date, pap smear aged out and dexa complete. Counseled about sun safety and mole surveillance. Counseled about the dangers of distracted driving. Given 10 year screening recommendations.

## 2022-12-13 ENCOUNTER — Telehealth: Payer: Self-pay | Admitting: Internal Medicine

## 2022-12-13 NOTE — Telephone Encounter (Signed)
Patient called in and said that she saw Dr Sharlet Salina 12/08/22 and that her BP was a little elevated but she said Dr Sharlet Salina said it wasn't concerning. She said it hasn't went down like it normally does. She said she took it a few times yesterday and the readings were 158/95 for the first time, 156/93 for the second, and the last was 152/98. This morning 140/93. She said she still has Duloxetine and said she was going to try and help the tingling/ burning sensation in her scalp that returned. She wants to know what Dr Sharlet Salina recommends she do. Call back is [phone]

## 2022-12-14 NOTE — Telephone Encounter (Signed)
Patient called back and said that it did go down some, last night it was 138/88 and this morning 130/90

## 2022-12-14 NOTE — Telephone Encounter (Signed)
LVM for pt to call the clinic for Dr. Nathanial Millman instructions on BP.

## 2022-12-14 NOTE — Telephone Encounter (Signed)
Monitor daily for next 1-2 weeks and let us know. We are looking for average around 140/90

## 2023-01-05 ENCOUNTER — Other Ambulatory Visit: Payer: Self-pay | Admitting: Internal Medicine

## 2023-01-28 ENCOUNTER — Other Ambulatory Visit: Payer: Self-pay | Admitting: Internal Medicine

## 2023-01-31 NOTE — Telephone Encounter (Signed)
Check [place] registry last filled 07/06/2022.Marland KitchenJohny Chess

## 2023-03-01 ENCOUNTER — Encounter: Payer: Self-pay | Admitting: Internal Medicine

## 2023-03-01 ENCOUNTER — Ambulatory Visit (INDEPENDENT_AMBULATORY_CARE_PROVIDER_SITE_OTHER): Payer: Medicare Other | Admitting: Internal Medicine

## 2023-03-01 VITALS — BP 126/80 | HR 86 | Temp 98.3°F | Ht 61.0 in | Wt 136.0 lb

## 2023-03-01 DIAGNOSIS — I10 Essential (primary) hypertension: Secondary | ICD-10-CM

## 2023-03-01 DIAGNOSIS — G609 Hereditary and idiopathic neuropathy, unspecified: Secondary | ICD-10-CM

## 2023-03-01 MED ORDER — GABAPENTIN 100 MG PO CAPS: 100.0000 mg | ORAL_CAPSULE | Freq: Every day | ORAL | 3 refills | Status: DC

## 2023-03-01 NOTE — Patient Instructions (Signed)
I will send in gabapentin to use for the tingling for the head.

## 2023-03-01 NOTE — Progress Notes (Signed)
   Subjective:   Patient ID: Star Age, female    DOB: July 10, 1946, 77 y.o.   MRN: [id number]  Hypertension Pertinent negatives include no chest pain, palpitations or shortness of breath.  Medication Refill Pertinent negatives include no abdominal pain, chest pain, coughing, nausea or vomiting.   The patient is a 77 YO female coming in for follow up.  Review of Systems  Constitutional: Negative.   HENT: Negative.    Eyes: Negative.   Respiratory:  Negative for cough, chest tightness and shortness of breath.   Cardiovascular:  Negative for chest pain, palpitations and leg swelling.  Gastrointestinal:  Negative for abdominal distention, abdominal pain, constipation, diarrhea, nausea and vomiting.  Musculoskeletal: Negative.   Skin: Negative.   Neurological: Negative.        Tingling scalp  Psychiatric/Behavioral: Negative.      Objective:  Physical Exam Constitutional:      Appearance: She is well-developed.  HENT:     Head: Normocephalic and atraumatic.  Cardiovascular:     Rate and Rhythm: Normal rate and regular rhythm.  Pulmonary:     Effort: Pulmonary effort is normal. No respiratory distress.     Breath sounds: Normal breath sounds. No wheezing or rales.  Abdominal:     General: Bowel sounds are normal. There is no distension.     Palpations: Abdomen is soft.     Tenderness: There is no abdominal tenderness. There is no rebound.  Musculoskeletal:     Cervical back: Normal range of motion.  Skin:    General: Skin is warm and dry.  Neurological:     Mental Status: She is alert and oriented to person, place, and time.     Coordination: Coordination normal.     Vitals:   03/01/23 1422  BP: 126/80  Pulse: 86  Temp: 98.3 F (36.8 C)  TempSrc: Oral  SpO2: 98%  Weight: 136 lb (61.7 kg)  Height: '5\' 1"'$  (1.549 m)    Assessment & Plan:

## 2023-03-01 NOTE — Assessment & Plan Note (Signed)
Has had recurrence of tingling sensation on scalp. Previously responded to gabapentin and cymbalta. She will increase cymbalta to 120 mg daily for 1 month and added gabapentin 100 mg qhs to help her sleep better. Call if not improved.

## 2023-03-01 NOTE — Assessment & Plan Note (Signed)
BP normal today which is improved. Still some variability at home. Will continue close monitoring. No BP medication added today.

## 2023-03-02 DIAGNOSIS — H40023 Open angle with borderline findings, high risk, bilateral: Secondary | ICD-10-CM | POA: Diagnosis not present

## 2023-03-31 ENCOUNTER — Other Ambulatory Visit: Payer: Self-pay | Admitting: Internal Medicine

## 2023-04-05 ENCOUNTER — Other Ambulatory Visit: Payer: Self-pay | Admitting: Internal Medicine

## 2023-04-05 DIAGNOSIS — Z1231 Encounter for screening mammogram for malignant neoplasm of breast: Secondary | ICD-10-CM

## 2023-04-29 ENCOUNTER — Telehealth: Payer: Self-pay | Admitting: Internal Medicine

## 2023-05-09 ENCOUNTER — Other Ambulatory Visit: Payer: Self-pay | Admitting: Internal Medicine

## 2023-05-16 ENCOUNTER — Ambulatory Visit
Admission: RE | Admit: 2023-05-16 | Discharge: 2023-05-16 | Disposition: A | Discharge status: Home / Self Care | Payer: Medicare Other | Source: Ambulatory Visit | Attending: Internal Medicine | Admitting: Internal Medicine

## 2023-05-16 DIAGNOSIS — Z1231 Encounter for screening mammogram for malignant neoplasm of breast: Secondary | ICD-10-CM

## 2023-05-18 ENCOUNTER — Encounter: Payer: Self-pay | Admitting: Internal Medicine

## 2023-05-18 LAB — HM MAMMOGRAPHY

## 2023-06-08 ENCOUNTER — Other Ambulatory Visit: Payer: Self-pay

## 2023-06-08 ENCOUNTER — Ambulatory Visit (INDEPENDENT_AMBULATORY_CARE_PROVIDER_SITE_OTHER): Payer: Medicare Other

## 2023-06-08 ENCOUNTER — Ambulatory Visit: Payer: Medicare Other | Admitting: Family Medicine

## 2023-06-08 VITALS — BP 148/88 | HR 77 | Ht 61.0 in | Wt 135.0 lb

## 2023-06-08 DIAGNOSIS — G8929 Other chronic pain: Secondary | ICD-10-CM

## 2023-06-08 DIAGNOSIS — M25562 Pain in left knee: Secondary | ICD-10-CM | POA: Diagnosis not present

## 2023-06-08 DIAGNOSIS — M1711 Unilateral primary osteoarthritis, right knee: Secondary | ICD-10-CM | POA: Diagnosis not present

## 2023-06-08 DIAGNOSIS — M25561 Pain in right knee: Secondary | ICD-10-CM | POA: Diagnosis not present

## 2023-06-08 DIAGNOSIS — M11261 Other chondrocalcinosis, right knee: Secondary | ICD-10-CM | POA: Diagnosis not present

## 2023-06-08 DIAGNOSIS — M1712 Unilateral primary osteoarthritis, left knee: Secondary | ICD-10-CM | POA: Diagnosis not present

## 2023-06-08 DIAGNOSIS — M170 Bilateral primary osteoarthritis of knee: Secondary | ICD-10-CM

## 2023-06-08 DIAGNOSIS — M11262 Other chondrocalcinosis, left knee: Secondary | ICD-10-CM | POA: Diagnosis not present

## 2023-06-08 NOTE — Progress Notes (Signed)
Rubin Payor, PhD, LAT, ATC acting as a scribe for Deborah Graham, MD.  Deborah Barton is a 77 y.o. female who presents to [facility] today for R knee pain. Pt was last seen by Dr. Denyse Amass on 06/04/21 and was given a R knee steroid injection and was advised to cont colchicine and Voltaren gel. A vascular US was also ordered to evaluate her calf pain.   Today, pt reports both knees are hurting her now. L knee pain started about a month ago. She locates pain to the anterior-medial aspect of both knees.   R knee swelling: yes  Diagnostic imaging: 06/08/21 R LE vasc US 04/03/20 R knee XR             04/02/20 R LE venous doppler  Pertinent review of systems: No fevers or chills.  Relevant historical information: Pseudogout versus gout possible.   Exam:  BP (!) 148/88   Pulse 77   Ht 5\' 1"  (1.549 m)   Wt 135 lb (61.2 kg)   SpO2 96%   BMI 25.51 kg/m  General: Well Developed, well nourished, and in no acute distress.   MSK: Right knee moderate effusion otherwise normal. Tender palpation medial joint line. Stable ligamentous exam. Intact strength.  Left knee: Mild effusion normal-appearing otherwise. Normal motion. Tender palpation medial joint line. Stable ligamentous exam. Intact strength.    Lab and Radiology Results  Procedure: Real-time Ultrasound Guided Injection of right knee joint superior lateral patellar space Device: Philips Affiniti 50G Images permanently stored and available for review in PACS Ultrasound evaluation prior to injection reveals moderate joint effusion near the medial joint line and moderate Baker's cyst. Present images incorrectly labeled as left knee.  All diagnostic ultrasound pictures from today are of the right knee. Verbal informed consent obtained.  Discussed risks and benefits of procedure. Warned about infection, bleeding, hyperglycemia damage to structures among others. Patient expresses understanding and  agreement Time-out conducted.   Noted no overlying erythema, induration, or other signs of local infection.   Skin prepped in a sterile fashion.   Local anesthesia: Topical Ethyl chloride.   With sterile technique and under real time ultrasound guidance: 40 mg of Kenalog and 2 mL Marcaine injected into knee joint. Fluid seen entering the joint capsule.   Completed without difficulty   Pain immediately resolved suggesting accurate placement of the medication.   Advised to call if fevers/chills, erythema, induration, drainage, or persistent bleeding.   Images permanently stored and available for review in the ultrasound unit.  Impression: Technically successful ultrasound guided injection.    Procedure: Real-time Ultrasound Guided Injection of left knee joint superior lateral patellar space Device: Philips Affiniti 50G Images permanently stored and available for review in PACS Verbal informed consent obtained.  Discussed risks and benefits of procedure. Warned about infection, bleeding, hyperglycemia damage to structures among others. Patient expresses understanding and agreement Time-out conducted.   Noted no overlying erythema, induration, or other signs of local infection.   Skin prepped in a sterile fashion.   Local anesthesia: Topical Ethyl chloride.   With sterile technique and under real time ultrasound guidance: 40 mg of Kenalog and 2 mL Marcaine injected into knee joint. Fluid seen entering the joint capsule.   Completed without difficulty   Pain immediately resolved suggesting accurate placement of the medication.   Advised to call if fevers/chills, erythema, induration, drainage, or persistent bleeding.   Images permanently stored and available for review in the ultrasound  unit.  Impression: Technically successful ultrasound guided injection.    X-ray images bilateral knees obtained today personally and independently interpreted  Right knee: Moderate medial and mild  patellofemoral DJD.  No acute fractures are visible.  Left knee: Mild medial and patellofemoral DJD.  No acute fractures are visible.  Await formal radiology review     Assessment and Plan: 77 y.o. female with bilateral knee pain right worse than left.  Pain thought to be due to DJD.  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  She has not been very responsive to colchicine so pseudogout is less likely.  Will go ahead and check uric acid for the possibility of gout as well.  Plan for bilateral steroid injections x-rays and uric acid.   PDMP not reviewed this encounter. Orders Placed This Encounter  Procedures   [place] LIMITED JOINT SPACE STRUCTURES LOW BILAT(NO LINKED CHARGES)    Order Specific Question:   Reason for Exam (SYMPTOM  OR DIAGNOSIS REQUIRED)    Answer:   bilateral knee pain    Order Specific Question:   Preferred imaging location?    Answer:   [facility] Knee AP/LAT W/Sunrise Left    Standing Status:   Future    Number of Occurrences:   1    Standing Expiration Date:   07/08/2023    Order Specific Question:   Reason for Exam (SYMPTOM  OR DIAGNOSIS REQUIRED)    Answer:   bilateral knee pain    Order Specific Question:   Preferred imaging location?    Answer:   [facility] Knee AP/LAT W/Sunrise Right    Standing Status:   Future    Number of Occurrences:   1    Standing Expiration Date:   06/07/2024    Order Specific Question:   Reason for Exam (SYMPTOM  OR DIAGNOSIS REQUIRED)    Answer:   eval knee pain r    Order Specific Question:   Preferred imaging location?    Answer:   [facility]   Uric acid    Standing Status:   Future    Standing Expiration Date:   06/07/2024   No orders of the defined types were placed in this encounter.    Discussed warning signs or symptoms. Please see discharge instructions. Patient expresses understanding.   The above documentation has been reviewed  and is accurate and complete Deborah Barton, M.D.

## 2023-06-08 NOTE — Patient Instructions (Addendum)
Thank you for coming in today.   Please get an Xray today before you leave   Please get labs today before you leave    Check back as needed

## 2023-06-10 NOTE — Progress Notes (Signed)
Right knee x-ray shows arthritis

## 2023-06-10 NOTE — Progress Notes (Signed)
Left knee x-ray shows mild arthritis.

## 2023-06-21 LAB — URIC ACID: Uric Acid, Serum: 5.2 mg/dL (ref 2.4–7.0)

## 2023-06-22 NOTE — Progress Notes (Signed)
Uric acid is normal. Gout is very unlikely.

## 2023-06-27 ENCOUNTER — Ambulatory Visit (INDEPENDENT_AMBULATORY_CARE_PROVIDER_SITE_OTHER): Payer: Medicare Other | Admitting: Internal Medicine

## 2023-06-27 ENCOUNTER — Encounter: Payer: Self-pay | Admitting: Internal Medicine

## 2023-06-27 VITALS — BP 134/70 | HR 92 | Temp 98.2°F | Ht 61.0 in | Wt 132.0 lb

## 2023-06-27 DIAGNOSIS — G4709 Other insomnia: Secondary | ICD-10-CM | POA: Diagnosis not present

## 2023-06-27 DIAGNOSIS — S91311A Laceration without foreign body, right foot, initial encounter: Secondary | ICD-10-CM

## 2023-06-27 MED ORDER — LORAZEPAM 1 MG PO TABS: ORAL_TABLET | ORAL | 0 refills | Status: DC

## 2023-06-27 NOTE — Progress Notes (Signed)
Subjective:    Patient ID: Deborah Barton, female    DOB: 29-Apr-1946, 77 y.o.   MRN: [id number]      HPI Deborah Barton is here for  Chief Complaint  Patient presents with   Puncture Wound    Cut on left foot from seashell    10 days ago - at beach - cut her right foot on an oyster shell.  She is concerned about infection.  The area starts to heal and then it looks like it is now.  She has been applying hydrogen peroxide and putting Neosporin on it.  She has been keeping it wrapped.  It is tender.  She had a couple of areas of cutting herself, but there is 1 area of concern that still hurts.  Occasionally she gets a little brownish discharge on her Band-Aid.  She denies any fevers or chills.  Occasionally she feels like there is pain going up the lower leg which she is not sure if it is related or not.     Medications and allergies reviewed with patient and updated if appropriate.  Current Outpatient Medications on File Prior to Visit  Medication Sig Dispense Refill   acetaminophen (TYLENOL) 500 MG tablet Take 1,000 mg by mouth every 6 (six) hours as needed for moderate pain.     Ascorbic Acid (VITAMIN C) 1000 MG tablet Take 1,000 mg by mouth daily.     aspirin EC 81 MG tablet Take by mouth.     cholecalciferol (VITAMIN D3) 25 MCG (1000 UT) tablet Take 1,000 Units by mouth daily.     colchicine 0.6 MG tablet Take 1 tablet (0.6 mg total) by mouth daily as needed (knee pain and swelling due to pseudogout). 30 tablet 3   cyclobenzaprine (FLEXERIL) 5 MG tablet Take 1 tablet (5 mg total) by mouth 3 (three) times daily as needed for muscle spasms. 60 tablet 5   DULoxetine (CYMBALTA) 60 MG capsule TAKE 1 CAPSULE BY MOUTH DAILY 90 capsule 3   fluticasone (FLONASE) 50 MCG/ACT nasal spray Place 2 sprays into both nostrils daily. 48 g 3   gabapentin (NEURONTIN) 100 MG capsule TAKE 1 CAPSULE BY MOUTH AT  BEDTIME 100 capsule 2   hydroxypropyl methylcellulose / hypromellose (ISOPTO TEARS  / GONIOVISC) 2.5 % ophthalmic solution Place 1 drop into both eyes as needed for dry eyes.     LORazepam (ATIVAN) 1 MG tablet TAKE 1 TABLET BY MOUTH AT  BEDTIME AS NEEDED FOR  ANXIETY 30 tablet 2   Multiple Vitamin (MULTIVITAMIN WITH MINERALS) TABS tablet Take 1 tablet by mouth daily. Adult 50+ vit.     pantoprazole (PROTONIX) 40 MG tablet Take 1 tablet (40 mg total) by mouth daily. 90 tablet 3   triamcinolone ointment (KENALOG) 0.1 % Apply 1 application topically 4 (four) times daily. On chapped lips 30 g 1   vitamin B-12 (CYANOCOBALAMIN) 500 MCG tablet Take 500 mcg by mouth daily.     No current facility-administered medications on file prior to visit.    Review of Systems     Objective:   Vitals:   06/27/23 1609  BP: 134/70  Pulse: 92  Temp: 98.2 F (36.8 C)  SpO2: 99%   BP Readings from Last 3 Encounters:  06/27/23 134/70  06/08/23 (!) 148/88  03/01/23 126/80   Wt Readings from Last 3 Encounters:  06/27/23 132 lb (59.9 kg)  06/08/23 135 lb (61.2 kg)  03/01/23 136 lb (61.7 kg)   Body mass index  is 24.94 kg/m.    Physical Exam Constitutional:      General: She is not in acute distress.    Appearance: Normal appearance. She is not ill-appearing.  HENT:     Head: Normocephalic and atraumatic.  Skin:    General: Skin is warm and dry.     Comments: Laceration right medial foot near first MCP joint-area is tender and there is still an open laceration there.  No bleeding or discharge.  No surrounding swelling or erythema.  She states the pain is better.  No numbness or tingling.  A couple of other very small cuts on the foot have already healed  Neurological:     Mental Status: She is alert.            Assessment & Plan:    Foot laceration, right: acute Occurred 10 days ago when she was at the beach-cut it on an oyster shell No evidence of infection Discussed this may take a long time to heal because of where it sat and every time she puts pressure on her  foot it likely opens up a little-continue local wound care and keep it wrapped tightly to help promote healing Tdap up-to-date No need for antibiotics at this time She will monitor closely and let me know if anything changes  Insomnia: Chronic She has had some increased stress recently and would like a refill of her lorazepam which she takes as needed for sleep [place] controlled substance database checked.   Refill sent to pharmacy.

## 2023-06-27 NOTE — Patient Instructions (Addendum)
? ? ? ?  ? ? ?  Medications changes include :  none  ? ? ? ? ? ?Return if symptoms worsen or fail to improve. ? ?

## 2023-09-28 ENCOUNTER — Ambulatory Visit: Payer: Medicare Other | Admitting: Internal Medicine

## 2023-09-28 ENCOUNTER — Encounter: Payer: Self-pay | Admitting: Internal Medicine

## 2023-09-28 VITALS — BP 124/82 | HR 86 | Temp 98.0°F | Ht 61.0 in | Wt 132.0 lb

## 2023-09-28 DIAGNOSIS — H93A9 Pulsatile tinnitus, unspecified ear: Secondary | ICD-10-CM | POA: Diagnosis not present

## 2023-09-28 DIAGNOSIS — Z23 Encounter for immunization: Secondary | ICD-10-CM | POA: Diagnosis not present

## 2023-09-28 MED ORDER — LORAZEPAM 1 MG PO TABS: 1.0000 mg | ORAL_TABLET | Freq: Every evening | ORAL | 2 refills | Status: DC | PRN

## 2023-09-28 NOTE — Progress Notes (Unsigned)
   Subjective:   Patient ID: Deborah Barton, female    DOB: 06/08/1946, 77 y.o.   MRN: [id number]  HPI The patient is a 77 YO female coming in for tinnitus, previous assessment with ENT 2022 and hearing test 2022 and 2023 with stable hearing changes.Overall stable to prior but disturbing sleep.  Review of Systems  Objective:  Physical Exam  Vitals:   09/28/23 1033  Pulse: 86  TempSrc: Oral  SpO2: 97%  Weight: 132 lb (59.9 kg)  Height: 5\' 1"  (1.549 m)    Assessment & Plan:

## 2023-09-28 NOTE — Patient Instructions (Signed)
Bose sleep plugs would help or something similar like a kids ear plug decibel reducing or noise blocking or noise cancelling.  We will get you back into ENT to see if there are other options.

## 2023-09-29 ENCOUNTER — Encounter: Payer: Self-pay | Admitting: Internal Medicine

## 2023-09-29 NOTE — Assessment & Plan Note (Signed)
Will do referral to ENT. She has been assessed by ENT and with US carotid 2022 without significant stenosis. Hearing assessed 2023 again without significant change in hearing. Tinnitus has progressed some since onset 2022ish.

## 2023-10-10 ENCOUNTER — Ambulatory Visit (INDEPENDENT_AMBULATORY_CARE_PROVIDER_SITE_OTHER): Payer: Medicare Other

## 2023-10-10 VITALS — Ht 61.0 in | Wt 132.0 lb

## 2023-10-10 DIAGNOSIS — Z5941 Food insecurity: Secondary | ICD-10-CM | POA: Diagnosis not present

## 2023-10-10 DIAGNOSIS — Z0000 Encounter for general adult medical examination without abnormal findings: Secondary | ICD-10-CM

## 2023-10-10 NOTE — Progress Notes (Signed)
Subjective:   Deborah Barton is a 77 y.o. female who presents for Medicare Annual (Subsequent) preventive examination.  Visit Complete: Virtual I connected with  [name] on 10/10/23 by a audio enabled telemedicine application and verified that I am speaking with the correct person using two identifiers.  Patient Location: Home  Provider Location: Office/Clinic  I discussed the limitations of evaluation and management by telemedicine. The patient expressed understanding and agreed to proceed.  Vital Signs: Because this visit was a virtual/telehealth visit, some criteria may be missing or patient reported. Any vitals not documented were not able to be obtained and vitals that have been documented are patient reported.  Because this visit was a virtual/telehealth visit, some criteria may be missing or patient reported. Any vitals not documented were not able to be obtained and vitals that have been documented are patient reported.    Cardiac Risk Factors include: advanced age (>38men, >39 women);hypertension     Objective:    Today's Vitals   10/10/23 1519 10/10/23 1520  Weight: 132 lb (59.9 kg)   Height: 5\' 1"  (1.549 m)   PainSc:  4    Body mass index is 24.94 kg/m.     10/10/2023    3:26 PM 12/08/2022   10:17 AM 12/22/2019    3:56 AM 07/10/2018    4:49 PM 01/28/2018   12:00 PM 10/08/2017    3:33 PM 04/19/2017    2:51 PM  Advanced Directives  Does Patient Have a Medical Advance Directive? No No No No No No No  Would patient like information on creating a medical advance directive?  No - Patient declined  Yes (MAU/Ambulatory/Procedural Areas - Information given) No - Patient declined No - Patient declined No - Patient declined    Current Medications (verified) Outpatient Encounter Medications as of 10/10/2023  Medication Sig   acetaminophen (TYLENOL) 500 MG tablet Take 1,000 mg by mouth every 6 (six) hours as needed for moderate pain.   Ascorbic  Acid (VITAMIN C) 1000 MG tablet Take 1,000 mg by mouth daily.   aspirin EC 81 MG tablet Take by mouth.   cholecalciferol (VITAMIN D3) 25 MCG (1000 UT) tablet Take 1,000 Units by mouth daily.   colchicine 0.6 MG tablet Take 1 tablet (0.6 mg total) by mouth daily as needed (knee pain and swelling due to pseudogout).   cyclobenzaprine (FLEXERIL) 5 MG tablet Take 1 tablet (5 mg total) by mouth 3 (three) times daily as needed for muscle spasms.   DULoxetine (CYMBALTA) 60 MG capsule TAKE 1 CAPSULE BY MOUTH DAILY   fluticasone (FLONASE) 50 MCG/ACT nasal spray Place 2 sprays into both nostrils daily.   gabapentin (NEURONTIN) 100 MG capsule TAKE 1 CAPSULE BY MOUTH AT  BEDTIME   hydroxypropyl methylcellulose / hypromellose (ISOPTO TEARS / GONIOVISC) 2.5 % ophthalmic solution Place 1 drop into both eyes as needed for dry eyes.   LORazepam (ATIVAN) 1 MG tablet Take 1 tablet (1 mg total) by mouth at bedtime as needed for anxiety.   Multiple Vitamin (MULTIVITAMIN WITH MINERALS) TABS tablet Take 1 tablet by mouth daily. Adult 50+ vit.   pantoprazole (PROTONIX) 40 MG tablet Take 1 tablet (40 mg total) by mouth daily.   triamcinolone ointment (KENALOG) 0.1 % Apply 1 application topically 4 (four) times daily. On chapped lips   vitamin B-12 (CYANOCOBALAMIN) 500 MCG tablet Take 500 mcg by mouth daily.   No facility-administered encounter medications on file as of 10/10/2023.    Allergies (verified)  Patient has no known allergies.   History: Past Medical History:  Diagnosis Date   Acid reflux    hx of   Anemia    hx of-   Anxiety    PRN meds   Arthritis    RIGHT knee   Cataract    bilateral sx   Hypertension    situational - not taking med   Neuropathy    Past Surgical History:  Procedure Laterality Date   ABDOMINAL HYSTERECTOMY  1994   CHOLECYSTECTOMY  1975   Family History  Problem Relation Age of Onset   Anemia Other    Arthritis Other    Asthma Other    Depression Other     Hypertension Other    Stroke Other    Uterine cancer Sister 50   Ovarian cancer Maternal Aunt 60   Ovarian cancer Maternal Grandmother 80   Breast cancer Paternal Grandmother    Colon cancer Neg Hx    Esophageal cancer Neg Hx    Stomach cancer Neg Hx    Rectal cancer Neg Hx    Colon polyps Neg Hx    Social History   Socioeconomic History   Marital status: Divorced    Spouse name: Not on file   Number of children: 2   Years of education: Not on file   Highest education level: Not on file  Occupational History   Occupation: Retired  Tobacco Use   Smoking status: Never   Smokeless tobacco: Never  Vaping Use   Vaping status: Never Used  Substance and Sexual Activity   Alcohol use: Yes    Alcohol/week: 3.0 standard drinks of alcohol    Types: 3 Standard drinks or equivalent per week    Comment: occasionally   Drug use: No   Sexual activity: Yes    Partners: Male    Comment: 1st intercourse- 66, partners- 6, current partner- 20 yrs   Other Topics Concern   Not on file  Social History Narrative   Younger son stays with her and she has a Nurse, mental health.   Social Determinants of Health   Financial Resource Strain: Medium Risk (10/10/2023)   Overall Financial Resource Strain (CARDIA)    Difficulty of Paying Living Expenses: Somewhat hard  Food Insecurity: Food Insecurity Present (10/10/2023)   Hunger Vital Sign    Worried About Running Out of Food in the Last Year: Often true    Ran Out of Food in the Last Year: Often true  Transportation Needs: No Transportation Needs (10/10/2023)   PRAPARE - Administrator, Civil Service (Medical): No    Lack of Transportation (Non-Medical): No  Physical Activity: Sufficiently Active (10/10/2023)   Exercise Vital Sign    Days of Exercise per Week: 3 days    Minutes of Exercise per Session: 60 min  Stress: No Stress Concern Present (10/10/2023)   [employer] of Occupational Health - Occupational Stress Questionnaire    Feeling  of Stress : Not at all  Social Connections: Moderately Isolated (10/10/2023)   Social Connection and Isolation Panel [NHANES]    Frequency of Communication with Friends and Family: More than three times a week    Frequency of Social Gatherings with Friends and Family: Once a week    Attends Religious Services: More than 4 times per year    Active Member of [employer] or Organizations: No    Attends Banker Meetings: Never    Marital Status: Divorced    Tobacco Counseling Counseling  given: Not Answered   Clinical Intake:  Pre-visit preparation completed: Yes  Pain : 0-10 Pain Score: 4  Pain Location: Knee Pain Orientation: Right Pain Radiating Towards: Valtaren gel, Tylenol Pain Frequency: Intermittent     BMI - recorded: 24.64 Nutritional Status: BMI of 19-24  Normal Nutritional Risks: None Diabetes: No  How often do you need to have someone help you when you read instructions, pamphlets, or other written materials from your doctor or pharmacy?: 1 - Never  Interpreter Needed?: No  Information entered by :: Niharika Savino, RMA   Activities of Daily Living    10/10/2023    3:22 PM 12/08/2022   10:03 AM  In your present state of health, do you have any difficulty performing the following activities:  Hearing? 1 1  Comment Lt ear issues ringing in the left ear  Vision? 0 0  Difficulty concentrating or making decisions? 1 0  Comment memory sometimes.   Walking or climbing stairs? 0 0  Dressing or bathing? 0 0  Doing errands, shopping? 0 0  Preparing Food and eating ? N N  Using the Toilet? N N  In the past six months, have you accidently leaked urine? N N  Do you have problems with loss of bowel control? N N  Managing your Medications? N N  Managing your Finances? N N  Housekeeping or managing your Housekeeping? N N    Patient Care Team: Myrlene Broker, MD as PCP - General (Internal Medicine) Szabat, Vinnie Level, [facility] (Inactive) as Pharmacist  (Pharmacist)  Indicate any recent Medical Services you may have received from other than [facility] providers in the past year (date may be approximate).     Assessment:   This is a routine wellness examination for Deborah Barton.  Hearing/Vision screen Hearing Screening - Comments:: Issues with lt ear. Vision Screening - Comments:: Denies vision issues   Goals Addressed               This Visit's Progress     Patient Stated (pt-stated)        Work on her memory.      Depression Screen    10/10/2023    3:37 PM 03/01/2023    2:27 PM 12/08/2022   10:02 AM 07/06/2022    2:03 PM 12/07/2021   11:02 AM 12/03/2020   10:54 AM 12/03/2019   10:20 AM  PHQ 2/9 Scores  PHQ - 2 Score 3 0 0 0 0 0 1  PHQ- 9 Score 6 0  0       Fall Risk    10/10/2023    3:30 PM 09/28/2023   10:33 AM 03/01/2023    2:27 PM 12/08/2022   10:03 AM 07/06/2022    2:03 PM  Fall Risk   Falls in the past year? 0 0 0 1 0  Number falls in past yr: 0 0 0 0 0  Injury with Fall? 0 0 0 0 0  Risk for fall due to : No Fall Risks   No Fall Risks   Follow up Falls prevention discussed;Falls evaluation completed Falls evaluation completed Falls evaluation completed Falls prevention discussed     MEDICARE RISK AT HOME: Medicare Risk at Home Any stairs in or around the home?: No Home free of loose throw rugs in walkways, pet beds, electrical cords, etc?: Yes Adequate lighting in your home to reduce risk of falls?: Yes Life alert?: No Use of a cane, walker or w/c?: No Grab bars in the bathroom?:  No Shower chair or bench in shower?: No Elevated toilet seat or a handicapped toilet?: No  TIMED UP AND GO:  Was the test performed?  No    Cognitive Function:        10/10/2023    3:31 PM 12/08/2022   10:03 AM  6CIT Screen  What Year? 0 points 0 points  What month? 0 points 0 points  What time? 0 points 0 points  Count back from 20 0 points 0 points  Months in reverse 2 points 0 points  Repeat phrase 0 points 0 points   Total Score 2 points 0 points    Immunizations Immunization History  Administered Date(s) Administered   Fluad Quad(high Dose 65+) 12/03/2020   Fluad Trivalent(High Dose 65+) 09/28/2023   Influenza Split 12/23/2011   Influenza Whole 12/17/2009, 12/16/2010   Influenza, High Dose Seasonal PF 11/08/2016, 11/23/2017, 11/30/2018   Influenza, Seasonal, Injecte, Preservative Fre 01/04/2013   Influenza,inj,Quad PF,6+ Mos 09/23/2014, 11/03/2015   Influenza-Unspecified 11/11/2019, 09/15/2022   PFIZER(Purple Top)SARS-COV-2 Vaccination 03/21/2020, 04/11/2020, 10/15/2020, 04/16/2021, 09/17/2021   Pfizer(Comirnaty)Fall Seasonal Vaccine 12 years and older 09/28/2022   Pneumococcal Conjugate-13 04/02/2014   Pneumococcal Polysaccharide-23 01/04/2013   Respiratory Syncytial Virus Vaccine,Recomb Aduvanted(Arexvy) 09/28/2022   Td 12/09/2008   Tdap 12/03/2019   Unspecified SARS-COV-2 Vaccination 04/22/2023   Zoster Recombinant(Shingrix) 03/30/2021, 06/01/2021    TDAP status: Up to date  Flu Vaccine status: Up to date  Pneumococcal vaccine status: Up to date  Covid-19 vaccine status: Information provided on how to obtain vaccines.   Qualifies for Shingles Vaccine? Yes   Zostavax completed Yes   Shingrix Completed?: Yes  Screening Tests Health Maintenance  Topic Date Due   Medicare Annual Wellness (AWV)  10/09/2024   Colonoscopy  01/30/2026   DTaP/Tdap/Td (3 - Td or Tdap) 12/02/2029   Pneumonia Vaccine 55+ Years old  Completed   INFLUENZA VACCINE  Completed   DEXA SCAN  Completed   COVID-19 Vaccine  Completed   Hepatitis C Screening  Completed   Zoster Vaccines- Shingrix  Completed   HPV VACCINES  Aged Out    Health Maintenance  There are no preventive care reminders to display for this patient.   Colorectal cancer screening: Type of screening: Colonoscopy. Completed 02/19/2021. Repeat every 5 years  Mammogram status: Completed 05/16/2023. Repeat every year  Bone Density  status: Completed 01/02/2022. Results reflect: Bone density results: NORMAL. Repeat every 2 years.  Lung Cancer Screening: (Low Dose CT Chest recommended if Age 67-80 years, 20 pack-year currently smoking OR have quit w/in 15years.) does not qualify.   Lung Cancer Screening Referral: N/A  Additional Screening:  Hepatitis C Screening: does qualify; Completed 12/04/2019  Vision Screening: Recommended annual ophthalmology exams for early detection of glaucoma and other disorders of the eye. Is the patient up to date with their annual eye exam?  Yes  Who is the provider or what is the name of the office in which the patient attends annual eye exams? Dr. Elmer Picker If pt is not established with a provider, would they like to be referred to a provider to establish care? No .   Dental Screening: Recommended annual dental exams for proper oral hygiene   Community Resource Referral / Chronic Care Management: CRR required this visit?  Yes   CCM required this visit?  No     Plan:     I have personally reviewed and noted the following in the patient's chart:   Medical and social history Use of alcohol,  tobacco or illicit drugs  Current medications and supplements including opioid prescriptions. [id number]  Functional ability and status Nutritional status Physical activity Advanced directives List of other physicians Hospitalizations, surgeries, and ER visits in previous 12 months Vitals Screenings to include cognitive, depression, and falls Referrals and appointments  In addition, I have reviewed and discussed with patient certain preventive protocols, quality metrics, and best practice recommendations. A written personalized care plan for preventive services as well as general preventive health recommendations were provided to patient.     Ahmani Prehn L Darvis Croft, CMA   10/10/2023   After Visit Summary: (MyChart) Due to this being a telephonic visit, the after visit summary with patients  personalized plan was offered to patient via MyChart   Nurse Notes: Patient is due for a Covid vaccine, which she will get soon.  Patient states that she is having pain in her lt knee to were it is beginning to bother her when walking.  She is asking about a handicap sticker to help out when she is out.  Patient is in need of help with food/groceries, so a [employer] referral has been placed to for patient. She had no other concerns to address today.

## 2023-10-10 NOTE — Patient Instructions (Addendum)
Deborah Barton , Thank you for taking time to come for your Medicare Wellness Visit. I appreciate your ongoing commitment to your health goals. Please review the following plan we discussed and let me know if I can assist you in the future.   Referrals/Orders/Follow-Ups/Clinician Recommendations: It was nice talking to you today.  Remember to ask your PCP about a Handicap sticker due to your knee issues.  Also remember to ask the nurse about the health care directives package, during your next office visit.  Be listening out for someone to call you in regards to getting help with some food security.  Each day, aim for 6 glasses of water, plenty of protein in your diet and try to get up and walk/ stretch every hour for 5-10 minutes at a time.    This is a list of the screening recommended for you and due dates:  Health Maintenance  Topic Date Due   Medicare Annual Wellness Visit  10/09/2024   Colon Cancer Screening  01/30/2026   DTaP/Tdap/Td vaccine (3 - Td or Tdap) 12/02/2029   Pneumonia Vaccine  Completed   Flu Shot  Completed   DEXA scan (bone density measurement)  Completed   COVID-19 Vaccine  Completed   Hepatitis C Screening  Completed   Zoster (Shingles) Vaccine  Completed   HPV Vaccine  Aged Out    Advanced directives: (Copy Requested) Please bring a copy of your health care power of attorney and living will to the office to be added to your chart at your convenience.  Next Medicare Annual Wellness Visit scheduled for next year: No

## 2023-10-11 ENCOUNTER — Telehealth: Payer: Self-pay | Admitting: *Deleted

## 2023-10-11 NOTE — Progress Notes (Unsigned)
  Care Coordination  Outreach Note  10/11/2023 Name: Deborah Barton MRN: [id number] DOB: 11/23/1946   Care Coordination Outreach Attempts: An unsuccessful telephone outreach was attempted today to offer the patient information about available care coordination services.  Follow Up Plan:  Additional outreach attempts will be made to offer the patient care coordination information and services.   Encounter Outcome:  No Answer  Burman Nieves, CCMA Care Coordination Care Guide Direct Dial: [phone]

## 2023-10-13 NOTE — Progress Notes (Signed)
  Care Coordination   Note   10/13/2023 Name: Kortnie Stovall MRN: [id number] DOB: 1946/09/15  Harlow Mares Buhman is a 77 y.o. year old female who sees Myrlene Broker, MD for primary care. I reached out to [name] by phone today to offer care coordination services.  Ms. Lubinski was given information about Care Coordination services today including:   The Care Coordination services include support from the care team which includes your Nurse Coordinator, Clinical Social Worker, or Pharmacist.  The Care Coordination team is here to help remove barriers to the health concerns and goals most important to you. Care Coordination services are voluntary, and the patient may decline or stop services at any time by request to their care team member.   Care Coordination Consent Status: Patient agreed to services and verbal consent obtained.   Follow up plan:  Telephone appointment with care coordination team member scheduled for:  10/18/2023  Encounter Outcome:  Patient Scheduled from referral   Burman Nieves, [facility] Care Coordination Care Guide Direct Dial: [phone]

## 2023-10-17 ENCOUNTER — Encounter: Payer: Medicare Other | Admitting: Licensed Clinical Social Worker

## 2023-10-18 ENCOUNTER — Ambulatory Visit: Payer: Self-pay | Admitting: Licensed Clinical Social Worker

## 2023-10-18 NOTE — Patient Outreach (Signed)
  Care Coordination   Follow Up Visit Note   10/18/2023 Name: Nadie Sicinski MRN: [id number] DOB: [birth year]  Deborah Barton is a 77 y.o. year old female who sees Myrlene Broker, MD for primary care. I spoke with  [name] by phone today.  What matters to the patients health and wellness today?  Food resources update    Goals Addressed             This Visit's Progress    Care Coordination Activites       Care Coordination Interventions: Patient stated that currently she is not low on food, but likes to have more, she declined assistance with applying for food stamps. The patient is going to wait on th food pantry list that was mailed and the SW educated the patient on the [employer]. The SW will follow up in about 2 weeks          SDOH assessments and interventions completed:  Yes  SDOH Interventions Today    Flowsheet Row Most Recent Value  SDOH Interventions   Food Insecurity Interventions Other (Comment)  [Pt declined applying for food stamps and is waiting for the resource list and the SW educated patient on the [employer] app.]  Housing Interventions Intervention Not Indicated  Transportation Interventions Intervention Not Indicated  Utilities Interventions Intervention Not Indicated  Financial Strain Interventions Intervention Not Indicated  [Patient stated that she pays her bills but it is aways tight, but family will assist if she ask]        Care Coordination Interventions:  Yes, provided  Interventions Today    Flowsheet Row Most Recent Value  General Interventions   General Interventions Discussed/Reviewed General Interventions Discussed, General Interventions Reviewed, [employer] does not want to apply for food stamps stated that she has food, Sw mailed out resources last call and educated the patient on the [employer] app.]  Education Interventions   Education Provided Provided Web-based  Education  [Sw educated patient on the [employer] App]        Follow up plan: Follow up call scheduled for 11/02/2023 at 1:00pm    Encounter Outcome:  Patient Visit Completed   Jeanie Cooks, PhD [facility], [facility] Social Worker Direct Dial: [phone]  Fax: [phone]

## 2023-10-18 NOTE — Patient Instructions (Signed)
Visit Information  Thank you for taking time to visit with me today. Please don't hesitate to contact me if I can be of assistance to you.   Following are the goals we discussed today:   Goals Addressed             This Visit's Progress    Care Coordination Activites       Care Coordination Interventions: Patient stated that currently she is not low on food, but likes to have more, she declined assistance with applying for food stamps. The patient is going to wait on th food pantry list that was mailed and the SW educated the patient on the [employer]. The SW will follow up in about 2 weeks          Our next appointment is by telephone on 11/02/2023 at 1:00pm  Please call the care guide team at [phone] if you need to cancel or reschedule your appointment.   If you are experiencing a Mental Health or Behavioral Health Crisis or need someone to talk to, please go to [facility] [address] [place] [phone]) call 911  Patient verbalizes understanding of instructions and care plan provided today and agrees to view in MyChart. Active MyChart status and patient understanding of how to access instructions and care plan via MyChart confirmed with patient.     Jeanie Cooks, PhD [facility], [facility] Social Worker Direct Dial: [phone]  Fax: [phone]

## 2023-11-02 ENCOUNTER — Ambulatory Visit: Payer: Self-pay | Admitting: Licensed Clinical Social Worker

## 2023-11-02 NOTE — Patient Instructions (Signed)
Visit Information  Thank you for taking time to visit with me today. Please don't hesitate to contact me if I can be of assistance to you.   Following are the goals we discussed today:   Goals Addressed             This Visit's Progress    Care Coordination Activites       Care Coordination Interventions: Patient stated that she has not received the food resources information that was mailed but is utilizing the [employer] app. The SW will send another request for the information to be mailed out again.          Our next appointment is by telephone on 11/30/2023 at 1:00pm  Please call the care guide team at [phone] if you need to cancel or reschedule your appointment.   If you are experiencing a Mental Health or Behavioral Health Crisis or need someone to talk to, please call the Suicide and Crisis Lifeline: 988 go to [facility] [address], [place] [phone]) call 911  Patient verbalizes understanding of instructions and care plan provided today and agrees to view in MyChart. Active MyChart status and patient understanding of how to access instructions and care plan via MyChart confirmed with patient.     Jeanie Cooks, PhD [facility], [facility] Social Worker Direct Dial: [phone]  Fax: [phone]

## 2023-11-02 NOTE — Patient Outreach (Signed)
  Care Coordination   Follow Up Visit Note   11/02/2023 Name: Deborah Barton MRN: [id number] DOB: [birth year]  Deborah Barton is a 77 y.o. year old female who sees Myrlene Broker, MD for primary care. I spoke with  Deborah Barton by phone today.  What matters to the patients health and wellness today?  Followup on food     Goals Addressed             This Visit's Progress    Care Coordination Activites       Care Coordination Interventions: Patient stated that she has not received the food resources information that was mailed but is utilizing the [employer] app. The SW will send another request for the information to be mailed out again.          SDOH assessments and interventions completed:  Yes  SDOH Interventions Today    Flowsheet Row Most Recent Value  SDOH Interventions   Food Insecurity Interventions Other (Comment)  [Patient has not received the resource information and Sw will resend it out again]        Care Coordination Interventions:  Yes, provided  Interventions Today    Flowsheet Row Most Recent Value  General Interventions   General Interventions Discussed/Reviewed General Interventions Discussed, Community Resources  [Sw wil resnd the food resource information, patient is using the [employer] app]        Follow up plan: Follow up call scheduled for 11/30/2023 at  1:00pm   Encounter Outcome:  Patient Visit Completed   Jeanie Cooks, PhD [facility], [facility] Social Worker Direct Dial: [phone]  Fax: [phone]

## 2023-11-03 DIAGNOSIS — H35373 Puckering of macula, bilateral: Secondary | ICD-10-CM | POA: Diagnosis not present

## 2023-11-03 DIAGNOSIS — H40023 Open angle with borderline findings, high risk, bilateral: Secondary | ICD-10-CM | POA: Diagnosis not present

## 2023-11-03 DIAGNOSIS — H524 Presbyopia: Secondary | ICD-10-CM | POA: Diagnosis not present

## 2023-11-10 ENCOUNTER — Ambulatory Visit (INDEPENDENT_AMBULATORY_CARE_PROVIDER_SITE_OTHER): Payer: Medicare Other | Admitting: Obstetrics and Gynecology

## 2023-11-10 ENCOUNTER — Other Ambulatory Visit (HOSPITAL_COMMUNITY)
Admission: RE | Admit: 2023-11-10 | Discharge: 2023-11-10 | Disposition: A | Discharge status: Home / Self Care | Payer: Medicare Other | Source: Ambulatory Visit | Attending: Obstetrics and Gynecology | Admitting: Obstetrics and Gynecology

## 2023-11-10 VITALS — BP 128/70 | HR 62 | Wt 131.0 lb

## 2023-11-10 DIAGNOSIS — N952 Postmenopausal atrophic vaginitis: Secondary | ICD-10-CM | POA: Diagnosis not present

## 2023-11-10 DIAGNOSIS — N761 Subacute and chronic vaginitis: Secondary | ICD-10-CM | POA: Diagnosis not present

## 2023-11-10 DIAGNOSIS — Z113 Encounter for screening for infections with a predominantly sexual mode of transmission: Secondary | ICD-10-CM | POA: Insufficient documentation

## 2023-11-10 LAB — WET PREP FOR TRICH, YEAST, CLUE

## 2023-11-10 NOTE — Patient Instructions (Signed)
 Atrophic Vaginitis  Atrophic vaginitis is a condition in which the tissues that line the vagina become dry and thin. This condition is most common in women who have stopped having regular menstrual periods (are in menopause). This usually starts when a woman is 94 to 77 years old. That is the time when a woman's estrogen levels begin to decrease. Estrogen is a female hormone. It helps to keep the tissues of the vagina moist. It stimulates the vagina to produce a clear fluid that lubricates the vagina for sex. This fluid also protects the vagina from infection. Lack of estrogen can cause the lining of the vagina to get thinner and dryer. The vagina may also shrink in size. It may become less elastic. Atrophic vaginitis tends to get worse over time as a woman's estrogen level drops. What are the causes? This condition is caused by the normal drop in estrogen that happens around the time of menopause. What increases the risk? Certain conditions or situations may lower a woman's estrogen level, leading to a higher risk for atrophic vaginitis. You are more likely to develop this condition if: You are taking medicines that block estrogen. You have had your ovaries removed. You are being treated for cancer with radiation or medicines (chemotherapy). You have given birth or are breastfeeding. You are older than age 39. You smoke. What are the signs or symptoms? Symptoms of this condition include: Pain, soreness, a feeling of pressure, or bleeding during sex (dyspareunia). Vaginal burning, irritation, or itching. Pain or bleeding when a speculum is used in a vaginal exam. Having burning pain while urinating. Vaginal discharge. In some cases, there are no symptoms. How is this diagnosed? This condition is diagnosed based on your medical history and a physical exam. This will include a pelvic exam that checks the vaginal tissues. Though rare, you may also have other tests, including: A urine test. A  test that checks the acid balance in your vagina (acid balance test). How is this treated? Treatment for this condition depends on how severe your symptoms are. Treatment may include: Using an over-the-counter vaginal lubricant before sex. Using a long-acting vaginal moisturizer. Using low-dose estrogen for moderate to severe symptoms that do not respond to other treatments. Options include creams, tablets, and inserts (vaginal rings). Before you use a vaginal estrogen, tell your health care provider if you have a history of: Breast cancer. Endometrial cancer. Blood clots. If you are not sexually active and your symptoms are very mild, you may not need treatment. Follow these instructions at home: Medicines Take over-the-counter and prescription medicines only as told by your health care provider. Do not use herbal or alternative medicines unless your health care provider says that you can. Use over-the-counter creams, lubricants, or moisturizers for dryness only as told by your health care provider. General instructions If your atrophic vaginitis is caused by menopause, discuss all of your menopause symptoms and treatment options with your health care provider. Do not douche. Do not use products that can make your vagina dry. These include: Scented feminine sprays. Scented tampons. Scented soaps. Vaginal sex can help to improve blood flow and elasticity of vaginal tissue. If you choose to have sex and it hurts, try using a water-soluble lubricant or moisturizer right before having sex. Contact a health care provider if: Your discharge looks different than normal. Your vagina has an unusual smell. You have new symptoms. Your symptoms do not improve with treatment. Your symptoms get worse. Summary Atrophic vaginitis is a condition  in which the tissues that line the vagina become dry and thin. It is most common in women who have stopped having regular menstrual periods (are in  menopause). Treatment options include using vaginal lubricants and low-dose vaginal estrogen. Contact a health care provider if your vagina has an unusual smell, or if your symptoms get worse or do not improve after treatment. This information is not intended to replace advice given to you by your health care provider. Make sure you discuss any questions you have with your health care provider. Document Revised: 06/12/2020 Document Reviewed: 06/12/2020 Elsevier Patient Education  2024 [employer].

## 2023-11-10 NOTE — Progress Notes (Signed)
GYNECOLOGY  VISIT   HPI: 77 y.o.   Divorced  [place]  female   G2P2 with No LMP recorded. Patient has had a hysterectomy.   here for some vaginal irritation on the labia.  States it comes and goes.  No discharge or odor.   No new product use. She does use different detergents, but nothing new.   Does not use pads.   No recent use of Replens.  No recent abx.    Same new partner for one year.  No condom use.  She accepts vaginal STD screening.   No hormonal therapy.   GYNECOLOGIC HISTORY: No LMP recorded. Patient has had a hysterectomy. Contraception:  pmp Menopausal hormone therapy:  none  Last mammogram:  05/16/23 Density B Bi-rads 1 neg  Last pap smear:   11/12/2019 normal         OB History     Gravida  2   Para  2   Term      Preterm      AB      Living  2      SAB      IAB      Ectopic      Multiple      Live Births                 Patient Active Problem List   Diagnosis Date Noted   Change in bowel habit 07/07/2022   Pulsatile tinnitus 07/07/2022   Left shoulder pain 07/07/2022   Needle phobia 01/14/2021   Forgetfulness 06/20/2018   OA (osteoarthritis) of knee 01/05/2018   Routine general medical examination at a health care facility 11/04/2015   Essential hypertension 01/27/2015   Hereditary and idiopathic peripheral neuropathy 01/27/2015   Generalized anxiety disorder 01/27/2015   MIXED HEARING LOSS UNILATERAL 04/27/2010    Past Medical History:  Diagnosis Date   Acid reflux    hx of   Anemia    hx of-   Anxiety    PRN meds   Arthritis    RIGHT knee   Cataract    bilateral sx   Hypertension    situational - not taking med   Neuropathy     Past Surgical History:  Procedure Laterality Date   ABDOMINAL HYSTERECTOMY  1994   CHOLECYSTECTOMY  1975    Current Outpatient Medications  Medication Sig Dispense Refill   acetaminophen (TYLENOL) 500 MG tablet Take 1,000 mg by mouth every 6 (six) hours as needed for  moderate pain.     Ascorbic Acid (VITAMIN C) 1000 MG tablet Take 1,000 mg by mouth daily.     aspirin EC 81 MG tablet Take by mouth.     cholecalciferol (VITAMIN D3) 25 MCG (1000 UT) tablet Take 1,000 Units by mouth daily.     colchicine 0.6 MG tablet Take 1 tablet (0.6 mg total) by mouth daily as needed (knee pain and swelling due to pseudogout). 30 tablet 3   cyclobenzaprine (FLEXERIL) 5 MG tablet Take 1 tablet (5 mg total) by mouth 3 (three) times daily as needed for muscle spasms. 60 tablet 5   DULoxetine (CYMBALTA) 60 MG capsule TAKE 1 CAPSULE BY MOUTH DAILY 90 capsule 3   gabapentin (NEURONTIN) 100 MG capsule TAKE 1 CAPSULE BY MOUTH AT  BEDTIME 100 capsule 2   hydroxypropyl methylcellulose / hypromellose (ISOPTO TEARS / GONIOVISC) 2.5 % ophthalmic solution Place 1 drop into both eyes as needed for dry eyes.  LORazepam (ATIVAN) 1 MG tablet Take 1 tablet (1 mg total) by mouth at bedtime as needed for anxiety. 30 tablet 2   Multiple Vitamin (MULTIVITAMIN WITH MINERALS) TABS tablet Take 1 tablet by mouth daily. Adult 50+ vit.     pantoprazole (PROTONIX) 40 MG tablet Take 1 tablet (40 mg total) by mouth daily. 90 tablet 3   triamcinolone ointment (KENALOG) 0.1 % Apply 1 application topically 4 (four) times daily. On chapped lips 30 g 1   vitamin B-12 (CYANOCOBALAMIN) 500 MCG tablet Take 500 mcg by mouth daily.     fluticasone (FLONASE) 50 MCG/ACT nasal spray Place 2 sprays into both nostrils daily. (Patient not taking: Reported on 11/10/2023) 48 g 3   No current facility-administered medications for this visit.     ALLERGIES: Patient has no known allergies.  Family History  Problem Relation Age of Onset   Anemia Other    Arthritis Other    Asthma Other    Depression Other    Hypertension Other    Stroke Other    Uterine cancer Sister 29   Ovarian cancer Maternal Aunt 60   Ovarian cancer Maternal Grandmother 6   Breast cancer Paternal Grandmother    Colon cancer Neg Hx     Esophageal cancer Neg Hx    Stomach cancer Neg Hx    Rectal cancer Neg Hx    Colon polyps Neg Hx     Social History   Socioeconomic History   Marital status: Divorced    Spouse name: Not on file   Number of children: 2   Years of education: Not on file   Highest education level: Not on file  Occupational History   Occupation: Retired  Tobacco Use   Smoking status: Never   Smokeless tobacco: Never  Vaping Use   Vaping status: Never Used  Substance and Sexual Activity   Alcohol use: Yes    Alcohol/week: 3.0 standard drinks of alcohol    Types: 3 Standard drinks or equivalent per week    Comment: occasionally   Drug use: No   Sexual activity: Yes    Partners: Male    Comment: 1st intercourse- 79, partners- 6, current partner- 20 yrs   Other Topics Concern   Not on file  Social History Narrative   Younger son stays with her and she has a Nurse, mental health.   Social Determinants of Health   Financial Resource Strain: Low Risk  (10/18/2023)   Overall Financial Resource Strain (CARDIA)    Difficulty of Paying Living Expenses: Not very hard  Recent Concern: Financial Resource Strain - Medium Risk (10/10/2023)   Overall Financial Resource Strain (CARDIA)    Difficulty of Paying Living Expenses: Somewhat hard  Food Insecurity: Food Insecurity Present (11/02/2023)   Hunger Vital Sign    Worried About Running Out of Food in the Last Year: Sometimes true    Ran Out of Food in the Last Year: Sometimes true  Transportation Needs: No Transportation Needs (10/18/2023)   PRAPARE - Administrator, Civil Service (Medical): No    Lack of Transportation (Non-Medical): No  Physical Activity: Sufficiently Active (10/10/2023)   Exercise Vital Sign    Days of Exercise per Week: 3 days    Minutes of Exercise per Session: 60 min  Stress: No Stress Concern Present (10/10/2023)   [employer] of Occupational Health - Occupational Stress Questionnaire    Feeling of Stress : Not at all   Social Connections: Moderately Isolated (10/10/2023)  Social Advertising account executive [NHANES]    Frequency of Communication with Friends and Family: More than three times a week    Frequency of Social Gatherings with Friends and Family: Once a week    Attends Religious Services: More than 4 times per year    Active Member of [employer] or Organizations: No    Attends Banker Meetings: Never    Marital Status: Divorced  Catering manager Violence: Patient Unable To Answer (10/10/2023)   Humiliation, Afraid, Rape, and Kick questionnaire    Fear of Current or Ex-Partner: Patient unable to answer    Emotionally Abused: Patient unable to answer    Physically Abused: Patient unable to answer    Sexually Abused: Patient unable to answer    Review of Systems  Genitourinary:        Vaginal itching   All other systems reviewed and are negative.   PHYSICAL EXAMINATION:    BP 128/70   Pulse 62   Wt 131 lb (59.4 kg)   BMI 24.75 kg/m     General appearance: alert, cooperative and appears stated age   Pelvic: External genitalia:  no lesions              Urethra:  normal appearing urethra with no masses, tenderness or lesions              Bartholins and Skenes: normal                 Vagina: normal appearing vagina with normal color and discharge, no lesions              Cervix: absent                Bimanual Exam:  Uterus: absent              Adnexa: no mass, fullness, tenderness           Chaperone was present for exam:  Antony Salmon, CMA  ASSESSMENT  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  STD screening.   Status post hysterectomy with BSO.  PLAN  Wet prep:  neg yeast, neg clue cells, neg trich. GC/CT testing.  We discussed water based lubricants like Replens, cooking oils, vaginal vit E, and vaginal estrogen treatment.  She declines prescription medication today for atrophy.   Return for annual exam and prn.   20 min  total time  was spent for this patient encounter, including preparation, face-to-face counseling with the patient, coordination of care, and documentation of the encounter.

## 2023-11-11 ENCOUNTER — Encounter: Payer: Self-pay | Admitting: Obstetrics and Gynecology

## 2023-11-11 LAB — CERVICOVAGINAL ANCILLARY ONLY
Chlamydia: NEGATIVE
Comment: NEGATIVE
Comment: NORMAL
Neisseria Gonorrhea: NEGATIVE

## 2023-11-14 ENCOUNTER — Ambulatory Visit: Payer: Medicare Other | Admitting: Nurse Practitioner

## 2023-11-30 ENCOUNTER — Ambulatory Visit: Payer: Self-pay | Admitting: Licensed Clinical Social Worker

## 2023-11-30 NOTE — Patient Outreach (Signed)
  Care Coordination   Follow Up Visit Note   11/30/2023 Name: Deborah Barton MRN: [id number] DOB: 11/19/1946  Deborah Barton is a 77 y.o. year old female who sees Myrlene Broker, MD for primary care. I spoke with  [name] by phone today.  What matters to the patients health and wellness today?  Patient receied th food resources in the mail and will utilize them     Goals Addressed             This Visit's Progress    COMPLETED: Care Coordination Activites       Care Coordination Interventions: Patient stated that she has not received the food resources information that was mailed but is utilizing the [employer]. The SW will send another request for the information to be mailed out again.          SDOH assessments and interventions completed:  Yes  SDOH Interventions Today    Flowsheet Row Most Recent Value  SDOH Interventions   Food Insecurity Interventions Intervention Not Indicated  [Patient received the resources in the mail]        Care Coordination Interventions:  Yes, provided  Interventions Today    Flowsheet Row Most Recent Value  General Interventions   General Interventions Discussed/Reviewed General Interventions Reviewed  [Patient received the resources]        Follow up plan: No further intervention required.   Encounter Outcome:  Patient Visit Completed   Jeanie Cooks, PhD [facility], [facility] Social Worker Direct Dial: [phone]  Fax: [phone]

## 2023-11-30 NOTE — Patient Instructions (Signed)
Visit Information  Thank you for taking time to visit with me today. Please don't hesitate to contact me if I can be of assistance to you.   Following are the goals we discussed today:   Goals Addressed             This Visit's Progress    COMPLETED: Care Coordination Activites       Care Coordination Interventions: Patient stated that she has not received the food resources information that was mailed but is utilizing the Greater [employer] app. The SW will send another request for the information to be mailed out again.          No further follow up  Please call the care guide team at [phone] if you need to cancel or reschedule your appointment.   If you are experiencing a Mental Health or Behavioral Health Crisis or need someone to talk to, please call the Suicide and Crisis Lifeline: 988 go to [facility] [address], [place] [phone]) call 911  Patient verbalizes understanding of instructions and care plan provided today and agrees to view in MyChart. Active MyChart status and patient understanding of how to access instructions and care plan via MyChart confirmed with patient.     Jeanie Cooks, PhD [facility], [facility] Social Worker Direct Dial: [phone]  Fax: [phone]

## 2023-12-06 ENCOUNTER — Ambulatory Visit (INDEPENDENT_AMBULATORY_CARE_PROVIDER_SITE_OTHER): Payer: Medicare Other | Admitting: Audiology

## 2023-12-06 ENCOUNTER — Ambulatory Visit (INDEPENDENT_AMBULATORY_CARE_PROVIDER_SITE_OTHER): Payer: Medicare Other | Admitting: Otolaryngology

## 2023-12-06 ENCOUNTER — Encounter (INDEPENDENT_AMBULATORY_CARE_PROVIDER_SITE_OTHER): Payer: Self-pay

## 2023-12-06 VITALS — Ht 61.0 in | Wt 132.0 lb

## 2023-12-06 DIAGNOSIS — H903 Sensorineural hearing loss, bilateral: Secondary | ICD-10-CM

## 2023-12-06 DIAGNOSIS — H93A2 Pulsatile tinnitus, left ear: Secondary | ICD-10-CM

## 2023-12-06 NOTE — Progress Notes (Addendum)
  [address] [place], [place] [address] [phone]  Audiological Evaluation    Name: Deborah Barton     DOB:   09-25-1946      MRN:   [id number]                                                                                     Service Date: 12/06/2023        Patient was referred today for a hearing evaluation by Dr. Karle Barr.   Symptoms Yes Details  Hearing loss  [x]  Patient reported perceiving a little hearing loss.  Tinnitus  [x]  Patient reported experiencing left ear pulsatile tinnitus.  Balance problems  []  Patient denied vertigo/imbalance sensations today.  Previous ear surgeries  []  Patient denied any previous ear surgeries.  Family history  [x]  Patient reported a family history of hearing loss with her father and brother.  Amplification  []  Patient denied the use of hearing aids.    Otoscopy: Right ear: Clear external ear canal; notable landmarks visualized on the tympanic membrane. Left ear: Clear external ear canal; notable landmarks visualized on the tympanic membrane.    Tympanogram: Right ear: Normal external ear canal volume with normal middle ear pressure and tympanic membrane compliance (Type A). Left ear: Normal external ear canal volume with no middle ear pressure and tympanic membrane compliance (Type B).    Hearing Evaluation: The audiogram was completed using conventional audiometric techniques under headphones with good reliability.   The hearing test results indicate: Right ear: Normal hearing sensitivity from [phone] Hz sloping to moderately-severe sensorineural hearing loss from 4000-8000 Hz. Left ear: Normal hearing sensitivity from [phone] Hz sloping to severe sensorineural hearing loss from 4000-8000 Hz.  Speech Audiometry: Right ear- Speech Reception Threshold (SRT) was obtained at 20 dBHL. Left ear- Speech Reception Threshold (SRT) was obtained at 20 dBHL.   Word Recognition Score Tested using NU-6 (MLV) Right  ear: 100% was obtained at a presentation level of 60 dBHL which is deemed as excellent understanding. Left ear: 100% was obtained at a presentation level of 60 dBHL which is deemed as excellent understanding.    Impression:  There is not a significant difference between puretone thresholds and word recognition scores between ears.   Recommendations: Repeat audiogram when changes are perceived or per MD. Consider various tinnitus strategies, including the use of a noise generator, hearing aids, or tinnitus retraining therapy.   Conley Rolls Mariette Cowley, AUD, CCC-A 12/06/23

## 2023-12-06 NOTE — Progress Notes (Signed)
Patient ID: Deborah Barton, female   DOB: 30-Dec-1945, 77 y.o.   MRN: [id number]  CC: Left ear pulsatile tinnitus  HPI:  Deborah Barton is a 77 y.o. female who presents today complaining of a constant left ear pulsatile tinnitus.  According to the patient, she has been symptomatic for more than 3 years.  Her left ear tinnitus is synchronized to her heartbeats.  She denies any significant otalgia, otorrhea, or vertigo.  She was seen at [facility] in 2022.  An MRI was recommended.  However, it was not performed.  Currently the patient denies any significant hearing difficulty.  She has no previous otologic surgery.  Past Medical History:  Diagnosis Date   Acid reflux    hx of   Anemia    hx of-   Anxiety    PRN meds   Arthritis    RIGHT knee   Cataract    bilateral sx   Hypertension    situational - not taking med   Neuropathy     Past Surgical History:  Procedure Laterality Date   ABDOMINAL HYSTERECTOMY  1994   CHOLECYSTECTOMY  1975    Family History  Problem Relation Age of Onset   Anemia Other    Arthritis Other    Asthma Other    Depression Other    Hypertension Other    Stroke Other    Uterine cancer Sister 47   Ovarian cancer Maternal Aunt 60   Ovarian cancer Maternal Grandmother 68   Breast cancer Paternal Grandmother    Colon cancer Neg Hx    Esophageal cancer Neg Hx    Stomach cancer Neg Hx    Rectal cancer Neg Hx    Colon polyps Neg Hx     Social History:  reports that she has never smoked. She has never used smokeless tobacco. She reports current alcohol use of about 3.0 standard drinks of alcohol per week. She reports that she does not use drugs.  Allergies: No Known Allergies  Prior to Admission medications   Medication Sig Start Date End Date Taking? Authorizing Provider  acetaminophen (TYLENOL) 500 MG tablet Take 1,000 mg by mouth every 6 (six) hours as needed for moderate pain.   Yes [provider]  Ascorbic Acid  (VITAMIN C) 1000 MG tablet Take 1,000 mg by mouth daily.   Yes [provider]  aspirin EC 81 MG tablet Take by mouth. 05/13/21  Yes [provider]  cholecalciferol (VITAMIN D3) 25 MCG (1000 UT) tablet Take 1,000 Units by mouth daily.   Yes [provider]  colchicine 0.6 MG tablet Take 1 tablet (0.6 mg total) by mouth daily as needed (knee pain and swelling due to pseudogout). 01/14/21  Yes Rodolph Bong, MD  cyclobenzaprine (FLEXERIL) 5 MG tablet Take 1 tablet (5 mg total) by mouth 3 (three) times daily as needed for muscle spasms. 12/08/22  Yes Myrlene Broker, MD  DULoxetine (CYMBALTA) 60 MG capsule TAKE 1 CAPSULE BY MOUTH DAILY 01/06/23  Yes Myrlene Broker, MD  hydroxypropyl methylcellulose / hypromellose (ISOPTO TEARS / GONIOVISC) 2.5 % ophthalmic solution Place 1 drop into both eyes as needed for dry eyes.   Yes [provider]  LORazepam (ATIVAN) 1 MG tablet Take 1 tablet (1 mg total) by mouth at bedtime as needed for anxiety. 09/28/23  Yes Myrlene Broker, MD  Multiple Vitamin (MULTIVITAMIN WITH MINERALS) TABS tablet Take 1 tablet by mouth daily. Adult 50+ vit.   Yes  [provider]  pantoprazole (PROTONIX) 40 MG tablet Take 1 tablet (40 mg total) by mouth daily. 12/08/22  Yes Myrlene Broker, MD  triamcinolone ointment (KENALOG) 0.1 % Apply 1 application topically 4 (four) times daily. On chapped lips 12/31/19  Yes Plotnikov, Georgina Quint, MD  vitamin B-12 (CYANOCOBALAMIN) 500 MCG tablet Take 500 mcg by mouth daily.   Yes [provider]  fluticasone (FLONASE) 50 MCG/ACT nasal spray Place 2 sprays into both nostrils daily. Patient not taking: Reported on 11/10/2023 01/01/22   Myrlene Broker, MD  gabapentin (NEURONTIN) 100 MG capsule TAKE 1 CAPSULE BY MOUTH AT  BEDTIME Patient not taking: Reported on 12/06/2023 05/09/23   Myrlene Broker, MD    Height 5\' 1"  (1.549 m), weight 132 lb (59.9 kg). Exam: General:  Communicates without difficulty, well nourished, no acute distress. Head: Normocephalic, no evidence injury, no tenderness, facial buttresses intact without stepoff. Face/sinus: No tenderness to palpation and percussion. Facial movement is normal and symmetric. Eyes: PERRL, EOMI. No scleral icterus, conjunctivae clear. Neuro: CN II exam reveals vision grossly intact.  No nystagmus at any point of gaze. Ears: Auricles well formed without lesions.  Ear canals are intact without mass or lesion.  No erythema or edema is appreciated.  The TMs are intact without fluid. Nose: External evaluation reveals normal support and skin without lesions.  Dorsum is intact.  Anterior rhinoscopy reveals normal mucosa over anterior aspect of inferior turbinates and intact septum.  No purulence noted. Oral:  Oral cavity and oropharynx are intact, symmetric, without erythema or edema.  Mucosa is moist without lesions. Neck: Full range of motion without pain.  There is no significant lymphadenopathy.  No masses palpable.  Thyroid bed within normal limits to palpation.  Parotid glands and submandibular glands equal bilaterally without mass.  Trachea is midline. Neuro:  CN 2-12 grossly intact.   Her hearing test shows bilateral symmetric high-frequency sensorineural hearing loss.  Assessment: 1.  Left ear pulsatile tinnitus of unknown etiology.  The possible differential diagnoses include vascular tumor, AV malformation, hyperdynamic state, aneurysm, sinus thrombosis, or other idiopathic causes.   2.  Her ear canals, tympanic membranes, and middle ear spaces are normal. 3.  Bilateral symmetric high-frequency sensorineural hearing loss.  Plan: 1.  The physical exam findings and the hearing test results are reviewed with the patient. 2.  The possible differential diagnoses are reviewed. 3.  The strategies to cope with tinnitus, including the use of masker, hearing aids, tinnitus retraining therapy, and avoidance of caffeine and  alcohol are discussed.  4.  MRI scan/CT angiography to evaluate for possible causes of her pulsatile tinnitus. 5.  The patient will return for follow-up after her imaging studies.  Chancy Smigiel W Atlee Villers 12/06/2023, 4:39 PM

## 2023-12-07 ENCOUNTER — Other Ambulatory Visit (INDEPENDENT_AMBULATORY_CARE_PROVIDER_SITE_OTHER): Payer: Self-pay | Admitting: Otolaryngology

## 2023-12-07 MED ORDER — DIAZEPAM 5 MG PO TABS
5.0000 mg | ORAL_TABLET | Freq: Once | ORAL | 0 refills | Status: AC
Start: 2023-12-07 — End: 2023-12-07

## 2023-12-12 ENCOUNTER — Encounter: Payer: Self-pay | Admitting: Internal Medicine

## 2023-12-12 ENCOUNTER — Telehealth: Payer: Self-pay | Admitting: Internal Medicine

## 2023-12-12 ENCOUNTER — Other Ambulatory Visit: Payer: Self-pay

## 2023-12-12 ENCOUNTER — Ambulatory Visit (INDEPENDENT_AMBULATORY_CARE_PROVIDER_SITE_OTHER): Payer: Medicare Other | Admitting: Internal Medicine

## 2023-12-12 VITALS — BP 132/80 | HR 98 | Temp 98.2°F | Ht 61.0 in | Wt 133.0 lb

## 2023-12-12 DIAGNOSIS — M25562 Pain in left knee: Secondary | ICD-10-CM | POA: Diagnosis not present

## 2023-12-12 DIAGNOSIS — I10 Essential (primary) hypertension: Secondary | ICD-10-CM

## 2023-12-12 DIAGNOSIS — G609 Hereditary and idiopathic neuropathy, unspecified: Secondary | ICD-10-CM | POA: Diagnosis not present

## 2023-12-12 DIAGNOSIS — Z0000 Encounter for general adult medical examination without abnormal findings: Secondary | ICD-10-CM | POA: Diagnosis not present

## 2023-12-12 DIAGNOSIS — F411 Generalized anxiety disorder: Secondary | ICD-10-CM | POA: Diagnosis not present

## 2023-12-12 LAB — LIPID PANEL
Cholesterol: 222 mg/dL — ABNORMAL HIGH (ref 0–200)
HDL: 63.5 mg/dL (ref >39.00)
LDL Cholesterol: 136 mg/dL — ABNORMAL HIGH (ref 0–99)
NonHDL: 158.17
Total CHOL/HDL Ratio: 3
Triglycerides: 110 mg/dL (ref 0.0–149.0)
VLDL: 22 mg/dL (ref 0.0–40.0)

## 2023-12-12 LAB — URINALYSIS, ROUTINE W REFLEX MICROSCOPIC
Bilirubin Urine: NEGATIVE
Hgb urine dipstick: NEGATIVE
Ketones, ur: NEGATIVE
Leukocytes,Ua: NEGATIVE
Nitrite: NEGATIVE
RBC / HPF: NONE SEEN (ref 0–?)
Specific Gravity, Urine: <=1.005 — AB (ref 1.000–1.030)
Total Protein, Urine: NEGATIVE
Urine Glucose: NEGATIVE
Urobilinogen, UA: 0.2 (ref 0.0–1.0)
WBC, UA: NONE SEEN (ref 0–?)
pH: 6 (ref 5.0–8.0)

## 2023-12-12 LAB — COMPREHENSIVE METABOLIC PANEL
ALT: 11 U/L (ref 0–35)
AST: 20 U/L (ref 0–37)
Albumin: 4.6 g/dL (ref 3.5–5.2)
Alkaline Phosphatase: 98 U/L (ref 39–117)
BUN: 16 mg/dL (ref 6–23)
CO2: 28 meq/L (ref 19–32)
Calcium: 9.9 mg/dL (ref 8.4–10.5)
Chloride: 102 meq/L (ref 96–112)
Creatinine, Ser: 0.8 mg/dL (ref 0.40–1.20)
GFR: 71.77 mL/min (ref >60.00)
Glucose, Bld: 70 mg/dL (ref 70–99)
Potassium: 4 meq/L (ref 3.5–5.1)
Sodium: 137 meq/L (ref 135–145)
Total Bilirubin: 0.4 mg/dL (ref 0.2–1.2)
Total Protein: 7.8 g/dL (ref 6.0–8.3)

## 2023-12-12 LAB — CBC
HCT: 37.3 % (ref 36.0–46.0)
Hemoglobin: 12.1 g/dL (ref 12.0–15.0)
MCHC: 32.3 g/dL (ref 30.0–36.0)
MCV: 74.3 fL — ABNORMAL LOW (ref 78.0–100.0)
Platelets: 302 10*3/uL (ref 150.0–400.0)
RBC: 5.02 Mil/uL (ref 3.87–5.11)
RDW: 16.2 % — ABNORMAL HIGH (ref 11.5–15.5)
WBC: 6.1 10*3/uL (ref 4.0–10.5)

## 2023-12-12 LAB — VITAMIN B12: Vitamin B-12: 521 pg/mL (ref 211–911)

## 2023-12-12 LAB — HEMOGLOBIN A1C: Hgb A1c MFr Bld: 5.9 % (ref 4.6–6.5)

## 2023-12-12 LAB — TSH: TSH: 1.01 u[IU]/mL (ref 0.35–5.50)

## 2023-12-12 LAB — VITAMIN D 25 HYDROXY (VIT D DEFICIENCY, FRACTURES): VITD: 37.64 ng/mL (ref 30.00–100.00)

## 2023-12-12 MED ORDER — CYCLOBENZAPRINE HCL 5 MG PO TABS: 5.0000 mg | ORAL_TABLET | Freq: Three times a day (TID) | ORAL | 5 refills | Status: AC | PRN

## 2023-12-12 MED ORDER — PREDNISONE 20 MG PO TABS: 40.0000 mg | ORAL_TABLET | Freq: Every day | ORAL | 0 refills | Status: DC

## 2023-12-12 MED ORDER — DULOXETINE HCL 60 MG PO CPEP: 60.0000 mg | ORAL_CAPSULE | Freq: Every day | ORAL | 3 refills | Status: DC

## 2023-12-12 MED ORDER — PANTOPRAZOLE SODIUM 40 MG PO TBEC: 40.0000 mg | DELAYED_RELEASE_TABLET | Freq: Every day | ORAL | 3 refills | Status: DC

## 2023-12-12 MED ORDER — LORAZEPAM 1 MG PO TABS: 1.0000 mg | ORAL_TABLET | Freq: Every evening | ORAL | 1 refills | Status: DC | PRN

## 2023-12-12 NOTE — Telephone Encounter (Signed)
This has been done.

## 2023-12-12 NOTE — Patient Instructions (Signed)
We have sent in a course of prednisone to help the knees to take 2 pills daily for 5 days.

## 2023-12-12 NOTE — Progress Notes (Signed)
   Subjective:   Patient ID: Deborah Barton, female    DOB: 1946/05/10, 77 y.o.   MRN: [id number]  HPI The patient is here for physical. Also having new left knee pain last 2 days.  PMH, [facility], social history reviewed and updated  Review of Systems  Constitutional: Negative.   HENT: Negative.    Eyes: Negative.   Respiratory:  Negative for cough, chest tightness and shortness of breath.   Cardiovascular:  Negative for chest pain, palpitations and leg swelling.  Gastrointestinal:  Negative for abdominal distention, abdominal pain, constipation, diarrhea, nausea and vomiting.  Musculoskeletal:  Positive for arthralgias and joint swelling.  Skin: Negative.   Neurological: Negative.   Psychiatric/Behavioral: Negative.      Objective:  Physical Exam Constitutional:      Appearance: She is well-developed.  HENT:     Head: Normocephalic and atraumatic.  Cardiovascular:     Rate and Rhythm: Normal rate and regular rhythm.  Pulmonary:     Effort: Pulmonary effort is normal. No respiratory distress.     Breath sounds: Normal breath sounds. No wheezing or rales.  Abdominal:     General: Bowel sounds are normal. There is no distension.     Palpations: Abdomen is soft.     Tenderness: There is no abdominal tenderness. There is no rebound.  Musculoskeletal:        General: Tenderness present.     Cervical back: Normal range of motion.     Comments: Some soft tissue swelling left knee superior and lateral without significant joint effusion ACL/PCL intact some tenderness diffuse  Skin:    General: Skin is warm and dry.  Neurological:     Mental Status: She is alert and oriented to person, place, and time.     Coordination: Coordination normal.     Vitals:   12/12/23 1301  BP: 132/80  Pulse: 98  Temp: 98.2 F (36.8 C)  TempSrc: Oral  SpO2: 99%  Weight: 133 lb (60.3 kg)  Height: 5\' 1"  (1.549 m)   EKG: Rate 84, axis normal, interval normal, sinus, no st or t wave  changes, no significant change compared to prior 2023   Assessment & Plan:

## 2023-12-12 NOTE — Assessment & Plan Note (Signed)
With new/worsening tingling feet and hands. Checking HgA1c, TSH, vitamin D and B12. Adjust as needed. Taking cymbalta 60 mg daily and gabapentin 100 mg at bedtime.

## 2023-12-12 NOTE — Assessment & Plan Note (Signed)
EKG done and stable. BP at goal on no meds. Checking CMP and adjust as needed.

## 2023-12-12 NOTE — Assessment & Plan Note (Signed)
Acute and started after going up and down ladder multiple times and carrying boxes as well as doing leaves. Rx prednisone 5 day course. No signs of tendon tear. Suspect arthritis with acute flare.

## 2023-12-12 NOTE — Assessment & Plan Note (Signed)
Flu shot complete. Pneumonia complete. Shingrix complete. Tetanus up to date. Colonoscopy up to date. Mammogram up to date, pap smear aged out and dexa due 2026. Counseled about sun safety and mole surveillance. Counseled about the dangers of distracted driving. Given 10 year screening recommendations.

## 2023-12-12 NOTE — Assessment & Plan Note (Signed)
Overall stable and will continue cymbalta 60 mg daily.

## 2023-12-12 NOTE — Telephone Encounter (Signed)
Patient wants the prednisone that you sent in today sent to [facility] on [facility]

## 2023-12-15 ENCOUNTER — Ambulatory Visit (HOSPITAL_COMMUNITY)
Admission: RE | Admit: 2023-12-15 | Discharge: 2023-12-15 | Disposition: A | Discharge status: Home / Self Care | Payer: Medicare Other | Source: Ambulatory Visit | Attending: Otolaryngology | Admitting: Otolaryngology

## 2023-12-15 DIAGNOSIS — H93A2 Pulsatile tinnitus, left ear: Secondary | ICD-10-CM | POA: Diagnosis not present

## 2023-12-15 DIAGNOSIS — I6782 Cerebral ischemia: Secondary | ICD-10-CM | POA: Diagnosis not present

## 2023-12-15 MED ORDER — GADOBUTROL 1 MMOL/ML IV SOLN
5.0000 mL | Freq: Once | INTRAVENOUS | Status: AC | PRN
  Administered 2023-12-15: 5 mL via INTRAVENOUS

## 2023-12-26 ENCOUNTER — Telehealth: Payer: Self-pay

## 2023-12-26 ENCOUNTER — Other Ambulatory Visit (INDEPENDENT_AMBULATORY_CARE_PROVIDER_SITE_OTHER): Payer: Self-pay | Admitting: Otolaryngology

## 2023-12-26 DIAGNOSIS — H93A2 Pulsatile tinnitus, left ear: Secondary | ICD-10-CM

## 2023-12-26 NOTE — Telephone Encounter (Signed)
Copied from CRM [id number]. Topic: Clinical - Lab/Test Results >> Dec 26, 2023 12:21 PM Kathryne Eriksson wrote:  Reason for CRM: Blood Work Results   >> Dec 26, 2023 12:24 PM Kathryne Eriksson wrote:  Patient is requesting a call back in regards to lab work, patient is wanting a clear full-detailed explanation. Call back number [phone]

## 2023-12-27 ENCOUNTER — Telehealth: Payer: Self-pay | Admitting: Internal Medicine

## 2023-12-27 ENCOUNTER — Telehealth (INDEPENDENT_AMBULATORY_CARE_PROVIDER_SITE_OTHER): Payer: Self-pay | Admitting: Otolaryngology

## 2023-12-27 NOTE — Telephone Encounter (Signed)
 Copied from CRM [id number]. Topic: Clinical - Lab/Test Results >> Dec 27, 2023  9:37 AM Eleanor C wrote: Reason for CRM: patient was calling back wanting answers from previous call to office stating the following :requesting a call back in regards to lab work, patient is wanting a clear full-detailed explanation. Call back number [phone] I did advise her that the office will be closing early 12/31 and will be closed on 1/1

## 2023-12-27 NOTE — Telephone Encounter (Signed)
This has been addressed.

## 2023-12-27 NOTE — Telephone Encounter (Signed)
Called patient back and went over results there were no questions or concerns

## 2023-12-27 NOTE — Telephone Encounter (Signed)
Per Dr. Suszanne Conners I told patient that her brain MRI was normal. Dr.Teoh would like order a CT angio head. She understood. Radiology will contact patient to schedule .

## 2023-12-29 ENCOUNTER — Ambulatory Visit: Payer: Self-pay | Admitting: Internal Medicine

## 2023-12-29 NOTE — Telephone Encounter (Signed)
   Chief Complaint: cough Symptoms: cough, yellow-green sputum, nasal congestion, hoarseness Frequency: since 12/26 Pertinent Negatives: Patient denies fever, body aches, shortness of breath Disposition: [] ED /[] Urgent Care (no appt availability in office) / [] Appointment(In office/virtual)/ []  [place] [facility]/ [x] Home Care/ [x] Refused Recommended Disposition /[] [place] [facility]/ []  Follow-up with PCP Additional Notes: Patient reports she has been experiencing cough, yellow-green sputum, nasal congestion, and hoarseness since the day after Christmas. Patient reports she has been taking OTC medication which helps a little bit but the symptoms keep coming back. Per protocol, home care is recommended for symptoms. Patient is requesting PCP to send stronger medication to pharmacy specifically to help cough. This RN advised that she would forward request to appropriate person for follow-up. Patient advised to call back with worsening symptoms. Patient verbalized understanding.   Copied from CRM [id number]. Topic: Clinical - Medical Advice >> Dec 29, 2023  3:33 PM Adaysia C wrote: Reason for CRM: Patient has been experiencing sinus infection symptoms such as congestion in her throat and chest. The patient would like to know if her provider can prescribe a medication that will help. Please follow up with patient [phone] Reason for Disposition  Cough with cold symptoms (e.g., runny nose, postnasal drip, throat clearing)  Answer Assessment - Initial Assessment Questions 1. ONSET: When did the cough begin?      The day after Christmas 2. SEVERITY: How bad is the cough today?      mild 3. SPUTUM: Describe the color of your sputum (none, dry cough; clear, white, yellow, green)     Yellow-green 4. HEMOPTYSIS: Are you coughing up any blood? If so ask: How much? (flecks, streaks, tablespoons, etc.)     none 5. DIFFICULTY BREATHING: Are you having difficulty breathing? If Yes,  ask: How bad is it? (e.g., mild, moderate, severe)    - MILD: No SOB at rest, mild SOB with walking, speaks normally in sentences, can lie down, no retractions, pulse < 100.    - MODERATE: SOB at rest, SOB with minimal exertion and prefers to sit, cannot lie down flat, speaks in phrases, mild retractions, audible wheezing, pulse 100-120.    - SEVERE: Very SOB at rest, speaks in single words, struggling to breathe, sitting hunched forward, retractions, pulse > 120      none 6. FEVER: Do you have a fever? If Yes, ask: What is your temperature, how was it measured, and when did it start?     none 7. CARDIAC HISTORY: Do you have any history of heart disease? (e.g., heart attack, congestive heart failure)      none 8. LUNG HISTORY: Do you have any history of lung disease?  (e.g., pulmonary embolus, asthma, emphysema)     none 9. PE RISK FACTORS: Do you have a history of blood clots? (or: recent major surgery, recent prolonged travel, bedridden)     none 10. OTHER SYMPTOMS: Do you have any other symptoms? (e.g., runny nose, wheezing, chest pain)  Nasal congestion, hoarseness  Protocols used: Cough - Acute Productive-A-AH

## 2023-12-30 NOTE — Telephone Encounter (Signed)
 Pt would like a stronger medication

## 2024-01-02 ENCOUNTER — Ambulatory Visit: Payer: Self-pay | Admitting: Internal Medicine

## 2024-01-02 ENCOUNTER — Telehealth: Payer: Medicare Other | Admitting: Family Medicine

## 2024-01-02 NOTE — Progress Notes (Signed)
 The patient no-showed for appointment despite this provider sending direct link, reaching out via phone with no response and waiting for at least 10 minutes from appointment time for patient to join. They will be marked as a NS for this appointment/time.   Freddy Finner, NP

## 2024-01-02 NOTE — Telephone Encounter (Signed)
 The patient called after missing a virtual appointment. The patient stated that she attempted to login for the virtual appointment but was unable to do so.  She stated that she discussed issues with Mychart with the nurse earlier today and she thought the issues were resolved but she could not login at her appointment time.  She stated that no one called her for the appointment.  She said she just wants a prescription for a sinus infection.  She demanded to speak to someone at Dr. Marcel office.  She was transferred to Tara for further assistance. Reason for Disposition . [1] Caller demands to speak with the PCP AND [2] about sick adult (or sick caller)  Protocols used: Difficult Call-A-AH

## 2024-01-02 NOTE — Telephone Encounter (Signed)
 Copied from CRM [id number]. Topic: Clinical - Red Word Triage >> Jan 02, 2024 10:38 AM Joen NOVAK wrote: [facility] that prompted transfer to Nurse Triage: PATIENT CALLED SEVERAL DAYS PRIOR STATING SHE INFORMED REPRESENTATIVE SHE WAS EXPERIENCING SHORTNESS OF BREATH. TODAY SHE IS NOT EXPERIENCING SHORTNESS OF BREATH BUT I AM GETTING HER OVER FOR EVALUATION. PATIENT WAS UPSET BECAUSE SHE HAD NOT HEARD BACK BUT A MESSAGE WAS SENT THROUGH MYCHART. PATIENT STATES DOES NOT HAVE MYCHART PASSWORD AND THAT SHE WAS TOLD SHE WOULD RECEIVE A CALL BACK NOT A MYCHART NOTIFICATION.  Chief Complaint:  Sinus Congestion and Pain . Symptoms:  Green Phlegm , Notes the pressure on her head and eyes are better, however, the coughing is not allowing her to sleep. Frequency:  Daily since last week Pertinent Negatives: Patient denies SOB and Fever. Disposition: [] ED /[] Urgent Care (no appt availability in office) / [] Appointment(In office/virtual)/ [x]  [place] [facility]/ [] Home Care/ [] Refused Recommended Disposition /[] [place] [facility]/ []  Follow-up with PCP Additional Notes:  Patient states she  is without  her car today and can do a My chart. RN Agent transferred to  My chart to assist with my chart. Reason for Disposition  [1] SEVERE pain AND [2] not improved 2 hours after pain medicine  Answer Assessment - Initial Assessment Questions 1. LOCATION: Where does it hurt?       Sinus - Sinus Pressure  seems to be getting better than last week. Still Coughing  interrupting sleep 2. ONSET: When did the sinus pain start?  (e.g., hours, days)       Before the  New Year  3. SEVERITY: How bad is the pain?   (Scale 1-10; mild, moderate or severe)     - MODERATE (4-7): interferes with normal activities (e.g., work or school) or awakens from sleep     4. RECURRENT SYMPTOM: Have you ever had sinus problems before? If Yes, ask: When was the last time? and What happened that time?       Yes,  get it once in the  winter 5. NASAL CONGESTION: Is the nose blocked? If Yes, ask: Can you open it or must you breathe through your mouth?     Not any more  6. NASAL DISCHARGE: Do you have discharge from your nose? If so ask, What color?     Greenish drainage 7. FEVER: Do you have a fever? If Yes, ask: What is it, how was it measured, and when did it start?      Denies 8. OTHER SYMPTOMS: Do you have any other symptoms? (e.g., sore throat, cough, earache, difficulty breathing)      Cough and  Green phlegm  Protocols used: Sinus Pain or Congestion-A-AH

## 2024-01-03 NOTE — Telephone Encounter (Signed)
 Called patient and LVM.    1st attempt

## 2024-01-04 ENCOUNTER — Telehealth (INDEPENDENT_AMBULATORY_CARE_PROVIDER_SITE_OTHER): Payer: Medicare Other | Admitting: Nurse Practitioner

## 2024-01-04 ENCOUNTER — Telehealth: Payer: Self-pay | Admitting: Internal Medicine

## 2024-01-04 DIAGNOSIS — J40 Bronchitis, not specified as acute or chronic: Secondary | ICD-10-CM

## 2024-01-04 MED ORDER — AZITHROMYCIN 250 MG PO TABS: ORAL_TABLET | ORAL | 0 refills | Status: AC

## 2024-01-04 MED ORDER — BENZONATATE 100 MG PO CAPS: 100.0000 mg | ORAL_CAPSULE | Freq: Two times a day (BID) | ORAL | 0 refills | Status: DC | PRN

## 2024-01-04 MED ORDER — PROMETHAZINE-DM 6.25-15 MG/5ML PO SYRP: 5.0000 mL | ORAL_SOLUTION | Freq: Every evening | ORAL | 0 refills | Status: DC | PRN

## 2024-01-04 NOTE — Progress Notes (Signed)
   Established Patient Office Visit  An audio-only tele-health visit was completed today for this patient. I connected with  Deborah Barton on 01/04/24 utilizing audio-only technology and verified that I am speaking with the correct person using two identifiers. The patient was located at their  car in a parking lot, and I was located at the office of [facility] at [facility] during the encounter. I discussed the limitations of evaluation and management by telemedicine. The patient expressed understanding and agreed to proceed.   **Theatre stage manager was available to clinician at time of visit, but after multiple attempts we were unable to get video technology to work on the patient's end so audio-only visit was completed.    Subjective   Patient ID: Deborah Barton, female    DOB: 10/03/1946  Age: 78 y.o. MRN: [id number]  Chief Complaint  Patient presents with   Cough    Patient arrives for virtual visit for the above. Symptom onset >2 weeks ago. Started to improve on its own but then worsened again. Is experiencing productive cough and congestion. Taking over the counter mucinex and alkaseltzer as needed, these alleviate symptoms temporarily.     Review of Systems  Constitutional:  Negative for chills and fever.  HENT:  Positive for congestion. Negative for sinus pain.   Respiratory:  Positive for cough and sputum production. Negative for shortness of breath.   Cardiovascular:  Negative for chest pain and palpitations.  Neurological:  Negative for headaches.      Objective:     There were no vitals taken for this visit.   Physical Exam Comprehensive physical exam not completed today as office visit was conducted remotely.  Patient sounds well over phone, she does cough a couple of time, but no evidence of acute respiratory distress.  Patient was alert and oriented, and appeared to have appropriate judgment.   No results found for any visits on  01/04/24.    The 10-year ASCVD risk score (Arnett DK, et al., 2019) is: 21.4%    Assessment & Plan:   Problem List Items Addressed This Visit       Respiratory   Bronchitis - Primary   Acute, concern for secondary bacterial infection. Treat with zpak, promethazine -dextromethorphan at night as needed for cough, tessalon  perle during the day as needed for cough. She was warned not to drive/operate heavy machinery while taking the cough syrup. She was warned of fall risk from cough syrup and encouraged to change positions slowly especially if she wakes up to urinate overnight and reports her understanding. She was told to come to office if symptoms persist or worsen.       Relevant Medications   azithromycin  (ZITHROMAX ) 250 MG tablet   benzonatate  (TESSALON ) 100 MG capsule   promethazine -dextromethorphan (PROMETHAZINE -DM) 6.25-15 MG/5ML syrup    Return if symptoms worsen or fail to improve.    Lauraine FORBES Pereyra, NP

## 2024-01-04 NOTE — Telephone Encounter (Signed)
 Explained to patient that a nurse will call before hand to get her set up and also to make sure she is logged in correctly through my chart. Also patient as been schedule for a physical this year

## 2024-01-04 NOTE — Telephone Encounter (Signed)
Copied from CRM [id number]. Topic: General - Other >> Jan 04, 2024 12:42 PM Sonny Dandy B wrote: Reason for CRM: pt called to speak with MA Miciah, requesting a call back . [phone] she states she missed her call, requesting her to give her a call back.

## 2024-01-04 NOTE — Assessment & Plan Note (Signed)
 Acute, concern for secondary bacterial infection. Treat with zpak, promethazine -dextromethorphan at night as needed for cough, tessalon  perle during the day as needed for cough. She was warned not to drive/operate heavy machinery while taking the cough syrup. She was warned of fall risk from cough syrup and encouraged to change positions slowly especially if she wakes up to urinate overnight and reports her understanding. She was told to come to office if symptoms persist or worsen.

## 2024-01-04 NOTE — Telephone Encounter (Signed)
 Were you able to get in contact with the patient?

## 2024-01-12 ENCOUNTER — Ambulatory Visit (INDEPENDENT_AMBULATORY_CARE_PROVIDER_SITE_OTHER): Payer: Medicare Other

## 2024-01-17 ENCOUNTER — Ambulatory Visit (HOSPITAL_COMMUNITY)
Admission: RE | Admit: 2024-01-17 | Discharge: 2024-01-17 | Disposition: A | Discharge status: Home / Self Care | Payer: Medicare Other | Source: Ambulatory Visit | Attending: Otolaryngology | Admitting: Otolaryngology

## 2024-01-17 DIAGNOSIS — I6523 Occlusion and stenosis of bilateral carotid arteries: Secondary | ICD-10-CM | POA: Diagnosis not present

## 2024-01-17 DIAGNOSIS — H93A2 Pulsatile tinnitus, left ear: Secondary | ICD-10-CM | POA: Insufficient documentation

## 2024-01-17 MED ORDER — SODIUM CHLORIDE (PF) 0.9 % IJ SOLN
INTRAMUSCULAR | Status: AC
  Filled 2024-01-17: qty 50

## 2024-01-17 MED ORDER — IOHEXOL 350 MG/ML SOLN
75.0000 mL | Freq: Once | INTRAVENOUS | Status: AC | PRN
  Administered 2024-01-17: 75 mL via INTRAVENOUS

## 2024-01-20 ENCOUNTER — Telehealth (INDEPENDENT_AMBULATORY_CARE_PROVIDER_SITE_OTHER): Payer: Self-pay | Admitting: Otolaryngology

## 2024-01-20 NOTE — Telephone Encounter (Signed)
confirmed appt & location [id number] afm

## 2024-01-23 ENCOUNTER — Ambulatory Visit (INDEPENDENT_AMBULATORY_CARE_PROVIDER_SITE_OTHER): Payer: Medicare Other

## 2024-01-23 ENCOUNTER — Encounter (INDEPENDENT_AMBULATORY_CARE_PROVIDER_SITE_OTHER): Payer: Self-pay

## 2024-01-23 VITALS — BP 154/85 | HR 74 | Resp 19 | Ht 62.0 in | Wt 133.0 lb

## 2024-01-23 DIAGNOSIS — H903 Sensorineural hearing loss, bilateral: Secondary | ICD-10-CM

## 2024-01-23 DIAGNOSIS — H93A2 Pulsatile tinnitus, left ear: Secondary | ICD-10-CM

## 2024-01-24 ENCOUNTER — Ambulatory Visit (INDEPENDENT_AMBULATORY_CARE_PROVIDER_SITE_OTHER): Payer: Medicare Other

## 2024-01-24 ENCOUNTER — Ambulatory Visit: Payer: Self-pay

## 2024-01-24 ENCOUNTER — Ambulatory Visit: Payer: Medicare Other | Admitting: Family Medicine

## 2024-01-24 ENCOUNTER — Encounter: Payer: Self-pay | Admitting: Family Medicine

## 2024-01-24 VITALS — BP 138/88 | Ht 62.0 in | Wt 132.0 lb

## 2024-01-24 DIAGNOSIS — M545 Low back pain, unspecified: Secondary | ICD-10-CM | POA: Diagnosis not present

## 2024-01-24 DIAGNOSIS — M25562 Pain in left knee: Secondary | ICD-10-CM | POA: Diagnosis not present

## 2024-01-24 DIAGNOSIS — M47816 Spondylosis without myelopathy or radiculopathy, lumbar region: Secondary | ICD-10-CM | POA: Diagnosis not present

## 2024-01-24 DIAGNOSIS — M170 Bilateral primary osteoarthritis of knee: Secondary | ICD-10-CM

## 2024-01-24 DIAGNOSIS — M5416 Radiculopathy, lumbar region: Secondary | ICD-10-CM

## 2024-01-24 DIAGNOSIS — G8929 Other chronic pain: Secondary | ICD-10-CM

## 2024-01-24 DIAGNOSIS — M25561 Pain in right knee: Secondary | ICD-10-CM

## 2024-01-24 MED ORDER — GABAPENTIN 100 MG PO CAPS: 100.0000 mg | ORAL_CAPSULE | Freq: Every evening | ORAL | 2 refills | Status: DC | PRN

## 2024-01-24 NOTE — Patient Instructions (Signed)
Thank you for coming in today.  You received an injection today. Seek immediate medical attention if the joint becomes red, extremely painful, or is oozing fluid.  Please get an Xray today before you leave

## 2024-01-24 NOTE — Progress Notes (Unsigned)
   I, Stevenson Clinch, CMA acting as a scribe for [name], MD.  Deborah Barton is a 78 y.o. female who presents to [facility] at [facility] today for L leg pain. Pt was previously seen by Dr. Denyse Amass on 06/08/23 for bilat knee pain.   Today, pt c/o L leg pain x 1-2 months. Pt locates pain to left hip to knee. Sx worse at night. Pain and swelling in bilat knees.  Low back pain: denied back pain Radiating pain: L LE LE numbness/tingling: intermittently LE weakness: yes, L E Aggravates: night disturbance, ambulation Treatments tried: IBU  Pertinent review of systems: ***  Relevant historical information: ***   Exam:  There were no vitals taken for this visit. General: Well Developed, well nourished, and in no acute distress.   MSK: ***    Lab and Radiology Results No results found for this or any previous visit (from the past 72 hours). No results found.     Assessment and Plan: 78 y.o. female with ***   PDMP not reviewed this encounter. No orders of the defined types were placed in this encounter.  No orders of the defined types were placed in this encounter.    Discussed warning signs or symptoms. Please see discharge instructions. Patient expresses understanding.   ***

## 2024-01-24 NOTE — Progress Notes (Signed)
Patient ID: Deborah Barton, female   DOB: April 14, 1946, 78 y.o.   MRN: [id number]

## 2024-01-24 NOTE — Progress Notes (Signed)
Patient ID: Deborah Barton, female   DOB: June 27, 1946, 78 y.o.   MRN: [id number]  Follow-up: Left ear pulsatile tinnitus  HPI: The patient is a 78 year old female who returns today for her follow-up evaluation.  The patient was previously seen for her left ear pulsatile tinnitus.  She has been symptomatic for more than 3 years.  Her tinnitus is synchronized to her heartbeats.  At her last visit in December 2024, her ear canals, tympanic membranes, and middle ear spaces were all normal.  Her hearing test shows bilateral symmetric high-frequency sensorineural hearing loss.  She was subsequently evaluated with a head MRI scan and head CT angiography.  Both studies were negative.  The patient returns today complaining of persistent left ear pulsatile tinnitus.  However, the severity has decreased.  She denies any recent change in her hearing.  Currently she denies any otalgia, otorrhea, or vertigo.  Exam: General: Communicates without difficulty, well nourished, no acute distress. Head: Normocephalic, no evidence injury, no tenderness, facial buttresses intact without stepoff. Face/sinus: No tenderness to palpation and percussion. Facial movement is normal and symmetric. Eyes: PERRL, EOMI. No scleral icterus, conjunctivae clear. Neuro: CN II exam reveals vision grossly intact.  No nystagmus at any point of gaze. Ears: Auricles well formed without lesions.  Ear canals are intact without mass or lesion.  No erythema or edema is appreciated.  The TMs are intact without fluid. Nose: External evaluation reveals normal support and skin without lesions.  Dorsum is intact.  Anterior rhinoscopy reveals normal mucosa over anterior aspect of inferior turbinates and intact septum.  No purulence noted. Oral:  Oral cavity and oropharynx are intact, symmetric, without erythema or edema.  Mucosa is moist without lesions. Neck: Full range of motion without pain.  There is no significant lymphadenopathy.  No masses palpable.   Thyroid bed within normal limits to palpation.  Parotid glands and submandibular glands equal bilaterally without mass.  Trachea is midline. Neuro:  CN 2-12 grossly intact.   Assessment: 1.  Left ear subjective pulsatile tinnitus.  Her MRI scan and CT angiography were all negative. 2.  Her ear canals, tympanic membranes, and middle ear spaces are normal. 3.  Subjectively stable bilateral symmetric high-frequency sensorineural hearing loss.  Plan: 1.  The physical exam findings are reviewed with the patient. 2.  The MRI and CT angiography results are reviewed with the patient. 3.  The strategies to cope with tinnitus, including the use of masker, hearing aids, tinnitus retraining therapy, and avoidance of caffeine and alcohol are discussed.  4.  The patient will return for reevaluation in 1 year.

## 2024-02-03 ENCOUNTER — Encounter: Payer: Self-pay | Admitting: Family Medicine

## 2024-02-06 ENCOUNTER — Ambulatory Visit: Payer: Medicare Other | Admitting: Nurse Practitioner

## 2024-02-06 ENCOUNTER — Encounter: Payer: Medicare Other | Admitting: Nurse Practitioner

## 2024-02-21 ENCOUNTER — Encounter: Payer: Self-pay | Admitting: Family Medicine

## 2024-02-21 ENCOUNTER — Other Ambulatory Visit: Payer: Self-pay

## 2024-02-21 ENCOUNTER — Ambulatory Visit: Payer: Medicare Other | Admitting: Family Medicine

## 2024-02-21 VITALS — BP 128/82 | HR 89 | Ht 62.0 in | Wt 130.0 lb

## 2024-02-21 DIAGNOSIS — G8929 Other chronic pain: Secondary | ICD-10-CM

## 2024-02-21 DIAGNOSIS — M25562 Pain in left knee: Secondary | ICD-10-CM | POA: Diagnosis not present

## 2024-02-21 DIAGNOSIS — M25561 Pain in right knee: Secondary | ICD-10-CM

## 2024-02-21 DIAGNOSIS — M5416 Radiculopathy, lumbar region: Secondary | ICD-10-CM

## 2024-02-21 DIAGNOSIS — M25552 Pain in left hip: Secondary | ICD-10-CM

## 2024-02-21 MED ORDER — COLCHICINE 0.6 MG PO TABS: 0.6000 mg | ORAL_TABLET | Freq: Every day | ORAL | 2 refills | Status: DC | PRN

## 2024-02-21 MED ORDER — LORAZEPAM 0.5 MG PO TABS: ORAL_TABLET | ORAL | 0 refills | Status: DC

## 2024-02-21 NOTE — Patient Instructions (Addendum)
 Thank you for coming in today.   You should hear from MRI scheduling within 1 week. If you do not hear please let me know.    Try the colchicine for knee pain.   Ok to use ativan prior to MRI for claustrophobia. You will need a drive.   Let me know if does not work.

## 2024-02-21 NOTE — Progress Notes (Signed)
   I, Stevenson Clinch, CMA acting as a scribe for [name], MD.  Deborah Barton is a 78 y.o. female who presents to [facility] at [facility] today for f/u bilat knee pain and lumbar radiculopathy. Pt was last seen by Dr. Denyse Amass on 01/24/24 and was given bilat knee steroid injections and was prescribed gabapentin.  Today, pt reports left hip pain radiating into the left leg. Does note improvement of bilat knee pain s/p steroid injections. Does continue to have some tightness in the left knee. Locates hip pain to lateral aspect of the left hip. Sx worse with side-lying. At times, sx impact gait. Has been taking Motrin and Gabapentin with some improvement.   Dx imaging: 01/24/24 L-spine XR  06/08/23 R & L knee XR  Pertinent review of systems: No fevers or chills  Relevant historical information: Hypertension   Exam:  BP 128/82   Pulse 89   Ht 5\' 2"  (1.575 m)   Wt 130 lb (59 kg)   SpO2 97%   BMI 23.78 kg/m  General: Well Developed, well nourished, and in no acute distress.   MSK: Knee moderate effusion.  Normal-appearing otherwise. Normal lumbar motion.  Is alert some strength is intact.    Lab and Radiology Results  [facility] MSK Ultrasound of: Left knee Moderate effusion is present. Impression: Effusion      Assessment and Plan: 78 y.o. female with chronic left leg and knee pain.  Multifactorial.  She has L5 lumbar radicular symptoms and knee effusion and pain.  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  She did not have good response to steroid injection.  Plan for trial of colchicine for possible pseudogout.  If not better consider MRI.  Proceed to MRI lumbar spine looking for L5 lumbar radiculopathy left side.  Anticipate epidural steroid injection as a result.   PDMP reviewed during this encounter. Orders Placed This Encounter  Procedures   [place] LIMITED JOINT SPACE STRUCTURES LOW LEFT(NO  LINKED CHARGES)    Reason for Exam (SYMPTOM  OR DIAGNOSIS REQUIRED):   left hip pain    Preferred imaging location?:   [facility]   MR Lumbar Spine Wo Contrast    Standing Status:   Future    Expiration Date:   02/20/2025    What is the patient's sedation requirement?:   No Sedation    Does the patient have a pacemaker or implanted devices?:   No    Preferred imaging location?:   [facility]  (table limit-350lbs)   Meds ordered this encounter  Medications   colchicine 0.6 MG tablet    Sig: Take 1 tablet (0.6 mg total) by mouth daily as needed (gout or psuedogout pain).    Dispense:  30 tablet    Refill:  2   LORazepam (ATIVAN) 0.5 MG tablet    Sig: 1-2 tabs 30 - 60 min prior to MRI. Do not drive with this medicine.    Dispense:  4 tablet    Refill:  0     Discussed warning signs or symptoms. Please see discharge instructions. Patient expresses understanding.   The above documentation has been reviewed and is accurate and complete [name], M.D.

## 2024-02-28 ENCOUNTER — Other Ambulatory Visit: Payer: Medicare Other

## 2024-03-03 ENCOUNTER — Ambulatory Visit: Payer: Medicare Other

## 2024-03-03 DIAGNOSIS — M4807 Spinal stenosis, lumbosacral region: Secondary | ICD-10-CM | POA: Diagnosis not present

## 2024-03-03 DIAGNOSIS — M5416 Radiculopathy, lumbar region: Secondary | ICD-10-CM

## 2024-03-03 DIAGNOSIS — M5116 Intervertebral disc disorders with radiculopathy, lumbar region: Secondary | ICD-10-CM | POA: Diagnosis not present

## 2024-03-03 DIAGNOSIS — M4316 Spondylolisthesis, lumbar region: Secondary | ICD-10-CM | POA: Diagnosis not present

## 2024-03-03 DIAGNOSIS — M48061 Spinal stenosis, lumbar region without neurogenic claudication: Secondary | ICD-10-CM | POA: Diagnosis not present

## 2024-03-07 ENCOUNTER — Telehealth: Payer: Self-pay | Admitting: Family Medicine

## 2024-03-07 DIAGNOSIS — M5416 Radiculopathy, lumbar region: Secondary | ICD-10-CM

## 2024-03-07 NOTE — Telephone Encounter (Signed)
 MRI completed 03/03/24 but has not been resulted.   Per visit note 02/21/24:  Assessment and Plan: 78 y.o. female with chronic left leg and knee pain.  Multifactorial.  She has L5 lumbar radicular symptoms and knee effusion and pain.  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  She did not have good response to steroid injection.  Plan for trial of colchicine for possible pseudogout.  If not better consider MRI.   Proceed to MRI lumbar spine looking for L5 lumbar radiculopathy left side.  Anticipate epidural steroid injection as a result.   Forwarding to Dr. Denyse Barton to review and advise.

## 2024-03-07 NOTE — Telephone Encounter (Signed)
 Patient called stating that she is still having continued pain and swelling.  We are still waiting on MRI results but she wanted to know what else she could do?  Please advise.

## 2024-03-08 NOTE — Addendum Note (Signed)
 Addended by: Rodolph Bong on: 03/08/2024 06:24 AM   Modules accepted: Orders

## 2024-03-08 NOTE — Telephone Encounter (Signed)
 Pt called regarding MRI and ESI (as [facility] had already called her to schedule). Provided Dr. Zollie Pee read of the MRI. Answered numerous questions about the procedure, how the radiologist would be looking at the MRI images prior to [facility]. She stated that she would call [facility] back to schedule. Verbalized understanding.

## 2024-03-08 NOTE — Telephone Encounter (Signed)
 I looked at the MRI myself.  It looks like you have several areas where the nerves could be pinched causing leg pain.  I have ordered an epidural steroid injection will be helpful.  You should hear from [facility] imaging soon about scheduling.

## 2024-03-15 ENCOUNTER — Telehealth: Payer: Self-pay | Admitting: Family Medicine

## 2024-03-15 DIAGNOSIS — M5416 Radiculopathy, lumbar region: Secondary | ICD-10-CM

## 2024-03-15 NOTE — Telephone Encounter (Signed)
 I called the patient and let her know the MRI has not been read yet. She states she does not want to do the epidural yet if the MRI has not been read. She asked if that was the right thing to do to wait and not have the epidural done without knowing what the MRI says. I let the patient know that I was not able to advise her on that and that I would send a message over to Dr. Denyse Amass and his assistants to best advise that. She would like for someone to give her a call today preferably but I advised as it was the end of the day that it may be tomorrow when someone was able to get back to her.   Please advise.

## 2024-03-15 NOTE — Telephone Encounter (Signed)
 Patient called to ask about her MRI and she did not see anything on her MyChart. She would like for someone to explain to her what they found on her MRI. She has an epidural on Monday and she asked if someone could call her back today.

## 2024-03-16 ENCOUNTER — Encounter: Payer: Self-pay | Admitting: Family Medicine

## 2024-03-16 NOTE — Progress Notes (Signed)
 Lumbar spine MRI shows spinal stenosis more severe at L4-5 and medium and L3-4.  He also have narrowing as the nerves leave the spine at L4-5 and L5-S1.  This certainly could cause the symptoms that you are experiencing.  You are scheduled for an epidural steroid injection on March 24.  I think that is going to help.

## 2024-03-16 NOTE — Discharge Instructions (Signed)

## 2024-03-16 NOTE — Telephone Encounter (Signed)
 MRI was read.  Okay to proceed to epidural.

## 2024-03-19 ENCOUNTER — Ambulatory Visit
Admission: RE | Admit: 2024-03-19 | Discharge: 2024-03-19 | Disposition: A | Discharge status: Home / Self Care | Source: Ambulatory Visit | Attending: Family Medicine | Admitting: Family Medicine

## 2024-03-19 DIAGNOSIS — M4727 Other spondylosis with radiculopathy, lumbosacral region: Secondary | ICD-10-CM | POA: Diagnosis not present

## 2024-03-19 DIAGNOSIS — M48061 Spinal stenosis, lumbar region without neurogenic claudication: Secondary | ICD-10-CM | POA: Diagnosis not present

## 2024-03-19 DIAGNOSIS — M5416 Radiculopathy, lumbar region: Secondary | ICD-10-CM

## 2024-03-19 MED ORDER — IOPAMIDOL (ISOVUE-M 200) INJECTION 41%
1.0000 mL | Freq: Once | INTRAMUSCULAR | Status: AC
  Administered 2024-03-19: 1 mL via EPIDURAL

## 2024-03-19 MED ORDER — METHYLPREDNISOLONE ACETATE 40 MG/ML INJ SUSP (RADIOLOG
80.0000 mg | Freq: Once | INTRAMUSCULAR | Status: AC
Start: 2024-03-19 — End: 2024-03-19
  Administered 2024-03-19: 80 mg via EPIDURAL

## 2024-04-02 NOTE — Progress Notes (Unsigned)
 Rubin Payor, PhD, LAT, ATC acting as a scribe for [name], MD.  Deborah Barton is a 78 y.o. female who presents to [facility] at [facility] today for f/u lumbar radiculopathy post-ESI w/ MRI review. Pt was last seen by Dr. Denyse Amass on 02/21/24 and a l-spine MRI was ordered. Lumbar ESI on March 24th.  Today, pt reports pain will vary, but overall better, not constant. She notes pain along the medial aspect of her L knee. Ever since the [facility] she has been experiencing really bad indigestion. No pain in her LBP, just occasional stiffness in her L leg. She is wanting to know what types of exercises would be good.   Additionally she notes some pain in her thoracic spine associated with belching.  She denies central chest pain or exertional intolerance.  She does have a prescription for PPI Protonix but does not take it regularly.  Dx imaging: 03/03/24 L-spine MRI 01/24/24 L-spine XR   Pertinent review of systems: No fevers or chills  Relevant historical information: Hypertension   Exam:  BP 138/86   Pulse 72   Ht 5\' 2"  (1.575 m)   Wt 129 lb (58.5 kg)   SpO2 99%   BMI 23.59 kg/m  General: Well Developed, well nourished, and in no acute distress.   MSK: L-spine nontender palpation normal lumbar motion. Left hip tender palpation greater trochanter.  Left knee minimal effusion normal motion with crepitation.    Lab and Radiology Results  2 View chest x-ray images obtained today personally and independently interpreted. No acute abnormalities visible Await formal radiology review    Assessment and Plan: 78 y.o. female with resolving left lumbar radiculopathy following epidural steroid injection.  Additionally she continues to experience some left lateral hip pain and some medial left knee pain.  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  Plan for physical therapy as that should be helpful for the  left hip pain lumbar radiculopathy and left knee pain.  Consider repeat steroid injection in about a month if needed.  Colchicine refilled for possible pseudogout.  I recommend that she actually stopped taking it and see how she does.  If she needs to resume that she can.  As for the lumbar radiculopathy could repeat epidural steroid injection if needed.  As for the belching and thoracic back pain etiology is unclear.  Chest x-ray looks reassuring per my read today.  Given that she restart her PPI and follow-up with PCP.   PDMP not reviewed this encounter. Orders Placed This Encounter  Procedures   DG Chest 2 View    Standing Status:   Future    Number of Occurrences:   1    Expected Date:   04/03/2024    Expiration Date:   04/03/2025    Reason for Exam (SYMPTOM  OR DIAGNOSIS REQUIRED):   chest pain    Preferred imaging location?:   [facility]   Ambulatory referral to Physical Therapy    Referral Priority:   Routine    Referral Type:   Physical Medicine    Referral Reason:   Specialty Services Required    Requested Specialty:   Physical Therapy    Number of Visits Requested:   1   Meds ordered this encounter  Medications   colchicine 0.6 MG tablet    Sig: Take 1 tablet (0.6 mg total) by mouth daily as needed (gout or  psuedogout pain).    Dispense:  30 tablet    Refill:  2     Discussed warning signs or symptoms. Please see discharge instructions. Patient expresses understanding.   The above documentation has been reviewed and is accurate and complete [name], M.D.

## 2024-04-03 ENCOUNTER — Ambulatory Visit (INDEPENDENT_AMBULATORY_CARE_PROVIDER_SITE_OTHER)

## 2024-04-03 ENCOUNTER — Ambulatory Visit: Admitting: Family Medicine

## 2024-04-03 VITALS — BP 138/86 | HR 72 | Ht 62.0 in | Wt 129.0 lb

## 2024-04-03 DIAGNOSIS — G8929 Other chronic pain: Secondary | ICD-10-CM

## 2024-04-03 DIAGNOSIS — M25561 Pain in right knee: Secondary | ICD-10-CM | POA: Diagnosis not present

## 2024-04-03 DIAGNOSIS — R142 Eructation: Secondary | ICD-10-CM | POA: Diagnosis not present

## 2024-04-03 DIAGNOSIS — M25562 Pain in left knee: Secondary | ICD-10-CM | POA: Diagnosis not present

## 2024-04-03 DIAGNOSIS — M5416 Radiculopathy, lumbar region: Secondary | ICD-10-CM | POA: Diagnosis not present

## 2024-04-03 DIAGNOSIS — M25552 Pain in left hip: Secondary | ICD-10-CM

## 2024-04-03 DIAGNOSIS — M546 Pain in thoracic spine: Secondary | ICD-10-CM

## 2024-04-03 DIAGNOSIS — R079 Chest pain, unspecified: Secondary | ICD-10-CM | POA: Diagnosis not present

## 2024-04-03 MED ORDER — COLCHICINE 0.6 MG PO TABS: 0.6000 mg | ORAL_TABLET | Freq: Every day | ORAL | 2 refills | Status: DC | PRN

## 2024-04-03 NOTE — Patient Instructions (Addendum)
 Thank you for coming in today.   Use colchicine as needed for knee pain and swelling.   Ibuprofen maximum dose is 800mg  every 8 hours. Use sparingly, as needed.   Try taking the prantoprazole daily   Get a chest xray and follow up with Dr Okey Dupre.   Schedule your 1st physical therapy visit at the check out desk before you leave.  Check back with me in 1 month

## 2024-04-04 ENCOUNTER — Encounter (HOSPITAL_COMMUNITY): Payer: Self-pay

## 2024-04-04 ENCOUNTER — Emergency Department (HOSPITAL_COMMUNITY)
Admission: EM | Admit: 2024-04-04 | Discharge: 2024-04-04 | Disposition: A | Discharge status: Home / Self Care | Attending: Emergency Medicine | Admitting: Emergency Medicine

## 2024-04-04 ENCOUNTER — Encounter: Payer: Self-pay | Admitting: Family Medicine

## 2024-04-04 ENCOUNTER — Encounter: Payer: Medicare Other | Admitting: Nurse Practitioner

## 2024-04-04 ENCOUNTER — Emergency Department (HOSPITAL_COMMUNITY)

## 2024-04-04 ENCOUNTER — Other Ambulatory Visit: Payer: Self-pay

## 2024-04-04 DIAGNOSIS — R110 Nausea: Secondary | ICD-10-CM | POA: Diagnosis not present

## 2024-04-04 DIAGNOSIS — K30 Functional dyspepsia: Secondary | ICD-10-CM | POA: Diagnosis not present

## 2024-04-04 DIAGNOSIS — R1013 Epigastric pain: Secondary | ICD-10-CM | POA: Diagnosis present

## 2024-04-04 DIAGNOSIS — R079 Chest pain, unspecified: Secondary | ICD-10-CM | POA: Diagnosis not present

## 2024-04-04 DIAGNOSIS — K219 Gastro-esophageal reflux disease without esophagitis: Secondary | ICD-10-CM | POA: Diagnosis not present

## 2024-04-04 DIAGNOSIS — R0789 Other chest pain: Secondary | ICD-10-CM | POA: Diagnosis not present

## 2024-04-04 LAB — TROPONIN I (HIGH SENSITIVITY): Troponin I (High Sensitivity): 7 ng/L (ref <18)

## 2024-04-04 LAB — BASIC METABOLIC PANEL WITH GFR
Anion gap: 11 (ref 5–15)
BUN: 20 mg/dL (ref 8–23)
CO2: 26 mmol/L (ref 22–32)
Calcium: 9.7 mg/dL (ref 8.9–10.3)
Chloride: 98 mmol/L (ref 98–111)
Creatinine, Ser: 0.74 mg/dL (ref 0.44–1.00)
GFR, Estimated: >60 mL/min (ref >60)
Glucose, Bld: 103 mg/dL — ABNORMAL HIGH (ref 70–99)
Potassium: 3.8 mmol/L (ref 3.5–5.1)
Sodium: 135 mmol/L (ref 135–145)

## 2024-04-04 LAB — HEPATIC FUNCTION PANEL
ALT: 23 U/L (ref 0–44)
AST: 26 U/L (ref 15–41)
Albumin: 4 g/dL (ref 3.5–5.0)
Alkaline Phosphatase: 77 U/L (ref 38–126)
Bilirubin, Direct: <0.1 mg/dL (ref 0.0–0.2)
Total Bilirubin: 0.9 mg/dL (ref 0.0–1.2)
Total Protein: 7 g/dL (ref 6.5–8.1)

## 2024-04-04 LAB — CBC
HCT: 36.9 % (ref 36.0–46.0)
Hemoglobin: 11.3 g/dL — ABNORMAL LOW (ref 12.0–15.0)
MCH: 23.1 pg — ABNORMAL LOW (ref 26.0–34.0)
MCHC: 30.6 g/dL (ref 30.0–36.0)
MCV: 75.3 fL — ABNORMAL LOW (ref 80.0–100.0)
Platelets: 270 10*3/uL (ref 150–400)
RBC: 4.9 MIL/uL (ref 3.87–5.11)
RDW: 16.2 % — ABNORMAL HIGH (ref 11.5–15.5)
WBC: 11 10*3/uL — ABNORMAL HIGH (ref 4.0–10.5)
nRBC: 0 % (ref 0.0–0.2)

## 2024-04-04 LAB — LIPASE, BLOOD: Lipase: 35 U/L (ref 11–51)

## 2024-04-04 MED ORDER — ONDANSETRON HCL 4 MG PO TABS: 4.0000 mg | ORAL_TABLET | Freq: Four times a day (QID) | ORAL | 0 refills | Status: DC

## 2024-04-04 MED ORDER — SUCRALFATE 1 G PO TABS
1.0000 g | ORAL_TABLET | Freq: Once | ORAL | Status: AC
  Administered 2024-04-04: 1 g via ORAL
  Filled 2024-04-04: qty 1

## 2024-04-04 MED ORDER — FAMOTIDINE 20 MG PO TABS: 20.0000 mg | ORAL_TABLET | Freq: Every day | ORAL | 0 refills | Status: AC

## 2024-04-04 MED ORDER — FAMOTIDINE 20 MG PO TABS
20.0000 mg | ORAL_TABLET | Freq: Once | ORAL | Status: AC
  Administered 2024-04-04: 20 mg via ORAL
  Filled 2024-04-04: qty 1

## 2024-04-04 MED ORDER — SUCRALFATE 1 G PO TABS: 1.0000 g | ORAL_TABLET | Freq: Three times a day (TID) | ORAL | 0 refills | Status: DC

## 2024-04-04 NOTE — Progress Notes (Signed)
 Chest xray is normal appearing

## 2024-04-04 NOTE — Discharge Instructions (Addendum)
 You were seen in the emergency department for your abdominal pain.  Your workup and exam appears most consistent with acid reflux.  You should make sure that you are taking your Protonix every day as prescribed and I have also given you Pepcid and Carafate to take to help with your symptoms.  You can take Zofran as needed for nausea.  You should avoid NSAIDs such as ibuprofen, Aleve because these can worsen your symptoms.  You can follow-up with your primary doctor in the next few days to have your symptoms rechecked as well as GI as if you are having continued symptoms may need to have a scope.  You should return to the emergency department for significantly worsening pain, fevers, repetitive vomiting, black or bloody stools or any other new or concerning symptoms.

## 2024-04-04 NOTE — ED Triage Notes (Signed)
 Pt reports bad indigestion for past 3 weeks, hurts when she breaths, and hurts in her back at night, lots of belching. No relief with tums or another antacid. Tonight patient says she felt faint and nauseated. Pt reports burning pain across upper back and across epigastric area/below breasts. Pt denies vomiting, SOB, fever.

## 2024-04-04 NOTE — ED Provider Notes (Signed)
 Deborah Barton Provider Note   CSN: [id number] Arrival date & time: 04/04/24  0518     History  Chief Complaint  Patient presents with   Chest Pain   Gastroesophageal Reflux         Deborah Barton is a 78 y.o. female.  Patient is a 78 year old female with a past medical history of GERD and arthritis of her knee presenting to the emergency department with epigastric pain.  Patient states that she has had pain in her upper abdomen for about the last 2 weeks.  She states that it radiates into her right upper quadrant and the right side of her back.  She states that she has a lot of belching associated with the pain and this morning had some associated nausea but has not vomited.  She states that the pain eases up during the day when she is up and walking around and gets worse when she is lying down at night.  She denies any fevers or shortness of breath.  She states that she has had a prior cholecystectomy.  She states that she has been on Protonix as prescribed by her primary doctor but has had no significant relief.  The history is provided by the patient.  Chest Pain Gastroesophageal Reflux Associated symptoms include chest pain.       Home Medications Prior to Admission medications   Medication Sig Start Date End Date Taking? Authorizing Provider  famotidine (PEPCID) 20 MG tablet Take 1 tablet (20 mg total) by mouth daily. 04/04/24  Yes Theresia Lo, [place] K, DO  ondansetron (ZOFRAN) 4 MG tablet Take 1 tablet (4 mg total) by mouth every 6 (six) hours. 04/04/24  Yes Theresia Lo, [place] K, DO  sucralfate (CARAFATE) 1 g tablet Take 1 tablet (1 g total) by mouth 4 (four) times daily -  with meals and at bedtime for 14 days. 04/04/24 04/18/24 Yes Elayne Snare K, DO  Ascorbic Acid (VITAMIN C) 1000 MG tablet Take 1,000 mg by mouth daily.    [provider]  aspirin EC 81 MG tablet Take by mouth. 05/13/21   [provider]   benzonatate (TESSALON) 100 MG capsule Take 1 capsule (100 mg total) by mouth 2 (two) times daily as needed for cough. 01/04/24   Elenore Paddy, NP  cholecalciferol (VITAMIN D3) 25 MCG (1000 UT) tablet Take 1,000 Units by mouth daily.    [provider]  colchicine 0.6 MG tablet Take 1 tablet (0.6 mg total) by mouth daily as needed (gout or psuedogout pain). 04/03/24   Rodolph Bong, MD  cyclobenzaprine (FLEXERIL) 5 MG tablet Take 1 tablet (5 mg total) by mouth 3 (three) times daily as needed for muscle spasms. Patient not taking: Reported on 04/03/2024 12/12/23   Myrlene Broker, MD  diazepam (VALIUM) 5 MG tablet Take 5 mg by mouth once. 12/08/23   [provider]  DULoxetine (CYMBALTA) 60 MG capsule Take 1 capsule (60 mg total) by mouth daily. 12/12/23   Myrlene Broker, MD  fluticasone Aleda Grana) 50 MCG/ACT nasal spray Place 2 sprays into both nostrils daily. 01/01/22   Myrlene Broker, MD  gabapentin (NEURONTIN) 100 MG capsule Take 1-3 capsules (100-300 mg total) by mouth at bedtime as needed. 01/24/24   Rodolph Bong, MD  hydroxypropyl methylcellulose / hypromellose (ISOPTO TEARS / GONIOVISC) 2.5 % ophthalmic solution Place 1 drop into both eyes as needed for dry eyes.    [provider]  LORazepam (ATIVAN) 0.5 MG tablet 1-2 tabs 30 - 60 min prior to MRI. Do not drive with this medicine. 02/21/24   Rodolph Bong, MD  Multiple Vitamin (MULTIVITAMIN WITH MINERALS) TABS tablet Take 1 tablet by mouth daily. Adult 50+ vit.    [provider]  pantoprazole (PROTONIX) 40 MG tablet Take 1 tablet (40 mg total) by mouth daily. 12/12/23   Myrlene Broker, MD  promethazine-dextromethorphan (PROMETHAZINE-DM) 6.25-15 MG/5ML syrup Take 5 mLs by mouth at bedtime as needed for cough. 01/04/24   Elenore Paddy, NP  vitamin B-12 (CYANOCOBALAMIN) 500 MCG tablet Take 500 mcg by mouth daily.    [provider]      Allergies    Patient has no known allergies.     Review of Systems   Review of Systems  Cardiovascular:  Positive for chest pain.    Physical Exam Updated Vital Signs BP (!) 167/101 (BP Location: Left Arm)   Pulse 69   Temp 98.4 F (36.9 C) (Oral)   Resp (!) 21   Ht 5\' 2"  (1.575 m)   Wt 58.5 kg   SpO2 100%   BMI 23.59 kg/m  Physical Exam Vitals and nursing note reviewed.  Constitutional:      General: She is not in acute distress.    Appearance: She is well-developed.  HENT:     Head: Normocephalic.  Eyes:     Extraocular Movements: Extraocular movements intact.  Cardiovascular:     Rate and Rhythm: Normal rate and regular rhythm.     Heart sounds: Normal heart sounds.  Pulmonary:     Effort: Pulmonary effort is normal.     Breath sounds: Normal breath sounds.  Abdominal:     Palpations: Abdomen is soft.     Tenderness: There is abdominal tenderness (Mild epigastric). There is no guarding or rebound.  Musculoskeletal:        General: Normal range of motion.     Cervical back: Normal range of motion and neck supple.     Right lower leg: No edema.     Left lower leg: No edema.  Skin:    General: Skin is warm and dry.  Neurological:     General: No focal deficit present.     Mental Status: She is alert and oriented to person, place, and time.  Psychiatric:        Mood and Affect: Mood normal.        Behavior: Behavior normal.     ED Results / Procedures / Treatments   Labs (all labs ordered are listed, but only abnormal results are displayed) Labs Reviewed  BASIC METABOLIC PANEL WITH GFR - Abnormal; Notable for the following components:      Result Value   Glucose, Bld 103 (*)    All other components within normal limits  CBC - Abnormal; Notable for the following components:   WBC 11.0 (*)    Hemoglobin 11.3 (*)    MCV 75.3 (*)    MCH 23.1 (*)    RDW 16.2 (*)    All other components within normal limits  HEPATIC FUNCTION PANEL  LIPASE, BLOOD  TROPONIN I (HIGH SENSITIVITY)    EKG EKG  Interpretation Date/Time:  Wednesday April 04 2024 05:29:19 EDT Ventricular Rate:  77 PR Interval:  165 QRS Duration:  88 QT Interval:  364 QTC Calculation: 412 R Axis:   67  Text Interpretation: Sinus rhythm Artifact in lead(s) I III aVR aVL aVF no pvcs  noted compared to ekg of 28 January 2018 Confirmed by Margarita Grizzle [phone]) on 04/04/2024 7:48:44 AM  Radiology DG Chest 2 View Result Date: 04/04/2024 CLINICAL DATA:  Chest pain, indigestion and nausea. EXAM: CHEST - 2 VIEW COMPARISON:  04/03/2024 FINDINGS: Heart size and mediastinal contours appear normal. There is no pleural fluid, interstitial edema or airspace disease. The visualized osseous structures are unremarkable. IMPRESSION: No active cardiopulmonary disease. Electronically Signed   By: Signa Kell M.D.   On: 04/04/2024 07:12   DG Chest 2 View Result Date: 04/03/2024 CLINICAL DATA:  Chest pain EXAM: CHEST - 2 VIEW COMPARISON:  02/06/2018 FINDINGS: The heart size and mediastinal contours are within normal limits. Both lungs are clear. The visualized skeletal structures are unremarkable. IMPRESSION: No active cardiopulmonary disease. Electronically Signed   By: Judie Petit.  Shick M.D.   On: 04/03/2024 17:55    Procedures Procedures    Medications Ordered in ED Medications  famotidine (PEPCID) tablet 20 mg (has no administration in time range)  sucralfate (CARAFATE) tablet 1 g (has no administration in time range)    ED Course/ Medical Decision Making/ A&P                                 Medical Decision Making This patient presents to the ED with chief complaint(s) of abdominal pain with pertinent past medical history of GERD, OA of knee which further complicates the presenting complaint. The complaint involves an extensive differential diagnosis and also carries with it a high risk of complications and morbidity.    The differential diagnosis includes ACS, arrhythmia, anemia, pneumonia, pneumothorax, pulmonary edema, pleural  effusion, gastritis, GERD, PUD, pancreatitis, hepatitis  Additional history obtained: Additional history obtained from spouse Records reviewed outpatient sports medicine records  ED Course and Reassessment: On patient's arrival she is hemodynamically stable in no acute distress.  Initially had EKG and labs sent in triage.  EKG showed normal sinus rhythm without acute ischemic changes.  Initial troponin was negative.  Symptoms ongoing for several weeks so single troponin is sufficient.  LFTs and lipase were added on and labs are all within normal range.  She has had a prior cholecystectomy.  Suspect symptoms are likely related to GERD and was recommended to continue her PPI and add on Pepcid and Carafate.  Was recommended to avoid NSAIDs and will be given outpatient follow-up.  Independent labs interpretation:  The following labs were independently interpreted: Within normal range  Independent visualization of imaging: - I independently visualized the following imaging with scope of interpretation limited to determining acute life threatening conditions related to emergency care: Chest x-ray, which revealed no acute disease  Consultation: - Consulted or discussed management/test interpretation w/ external professional: N/A  Consideration for admission or further workup: Patient has no emergent conditions requiring admission or further work-up at this time and is stable for discharge home with primary care follow-up  Social Determinants of health: N/A    Amount and/or Complexity of Data Reviewed Labs: ordered. Radiology: ordered.  Risk Prescription drug management.          Final Clinical Impression(s) / ED Diagnoses Final diagnoses:  Gastroesophageal reflux disease without esophagitis    Rx / DC Orders ED Discharge Orders          Ordered    famotidine (PEPCID) 20 MG tablet  Daily        04/04/24 0927    sucralfate (CARAFATE) 1 g  tablet  3 times daily with meals &  bedtime        04/04/24 0927    ondansetron (ZOFRAN) 4 MG tablet  Every 6 hours        04/04/24 0927              Rexford Maus, DO 04/04/24 [phone]

## 2024-04-04 NOTE — ED Notes (Signed)
 Elevated BP, pt denies med for HTN

## 2024-04-09 ENCOUNTER — Encounter: Payer: Self-pay | Admitting: Internal Medicine

## 2024-04-09 ENCOUNTER — Ambulatory Visit: Admitting: Internal Medicine

## 2024-04-09 VITALS — BP 138/82 | HR 80 | Temp 97.9°F | Ht 62.0 in | Wt 127.0 lb

## 2024-04-09 DIAGNOSIS — K219 Gastro-esophageal reflux disease without esophagitis: Secondary | ICD-10-CM | POA: Diagnosis not present

## 2024-04-09 DIAGNOSIS — F411 Generalized anxiety disorder: Secondary | ICD-10-CM | POA: Diagnosis not present

## 2024-04-09 DIAGNOSIS — I10 Essential (primary) hypertension: Secondary | ICD-10-CM

## 2024-04-09 MED ORDER — LORAZEPAM 1 MG PO TABS
0.5000 mg | ORAL_TABLET | Freq: Every day | ORAL | 1 refills | Status: DC
Start: 2024-04-09 — End: 2024-08-17

## 2024-04-09 MED ORDER — PANTOPRAZOLE SODIUM 40 MG PO TBEC: 40.0000 mg | DELAYED_RELEASE_TABLET | Freq: Every day | ORAL | 3 refills | Status: AC

## 2024-04-09 NOTE — Patient Instructions (Signed)
 Keep taking the pepcid and sucralfate until they are gone.

## 2024-04-09 NOTE — Assessment & Plan Note (Signed)
 Needs refill of lorazepam which is done.

## 2024-04-09 NOTE — Progress Notes (Signed)
   Subjective:   Patient ID: Deborah Barton, female    DOB: Dec 24, 1946, 78 y.o.   MRN: [id number]  HPI The patient is a 78 YO female coming in for ER follow up (was in for GERD with pain and belching, ER added pepcid to her chronic protonix). She is doing okay and the medicine is helping. She has changed diet some and sitting upright longer after eating. Starting PT for low back soon (had epidural recently).   PMH, FMh, social history reviewed and updated  Review of Systems  Constitutional: Negative.   HENT: Negative.    Eyes: Negative.   Respiratory:  Negative for cough, chest tightness and shortness of breath.   Cardiovascular:  Negative for chest pain, palpitations and leg swelling.  Gastrointestinal:  Negative for abdominal distention, abdominal pain, constipation, diarrhea, nausea and vomiting.  Musculoskeletal: Negative.   Skin: Negative.   Neurological: Negative.   Psychiatric/Behavioral: Negative.      Objective:  Physical Exam Constitutional:      Appearance: She is well-developed.  HENT:     Head: Normocephalic and atraumatic.  Cardiovascular:     Rate and Rhythm: Normal rate and regular rhythm.  Pulmonary:     Effort: Pulmonary effort is normal. No respiratory distress.     Breath sounds: Normal breath sounds. No wheezing or rales.  Abdominal:     General: Bowel sounds are normal. There is no distension.     Palpations: Abdomen is soft.     Tenderness: There is no abdominal tenderness. There is no rebound.  Musculoskeletal:     Cervical back: Normal range of motion.  Skin:    General: Skin is warm and dry.  Neurological:     Mental Status: She is alert and oriented to person, place, and time.     Coordination: Coordination normal.     Vitals:   04/09/24 1111 04/09/24 1134  BP: (!) 160/80 138/82  Pulse: 80   Temp: 97.9 F (36.6 C)   TempSrc: Oral   SpO2: 92%   Weight: 127 lb (57.6 kg)   Height: 5\' 2"  (1.575 m)     Assessment & Plan:  Visit  time 20 minutes in face to face communication with patient and coordination of care, additional 10 minutes spent in record review, coordination or care, ordering tests, communicating/referring to other healthcare professionals, documenting in medical records all on the same day of the visit for total time 30 minutes spent on the visit.

## 2024-04-09 NOTE — Assessment & Plan Note (Signed)
 She had been taking more nsaids due to pain which likely triggered flare. She is still taking protonix 40 mg daily and refilled. She is asked to finish pepcid and sucralfate and then stop those 2. Let us  know if symptoms are recurrent. Given information about counseled about food choices.

## 2024-04-09 NOTE — Assessment & Plan Note (Signed)
 BP initially high but recheck normal. Continue off meds and monitor closely.

## 2024-04-10 ENCOUNTER — Ambulatory Visit: Admitting: Physical Therapy

## 2024-04-10 ENCOUNTER — Other Ambulatory Visit: Payer: Self-pay

## 2024-04-10 ENCOUNTER — Encounter: Payer: Self-pay | Admitting: Physical Therapy

## 2024-04-10 DIAGNOSIS — M6281 Muscle weakness (generalized): Secondary | ICD-10-CM | POA: Diagnosis not present

## 2024-04-10 DIAGNOSIS — G8929 Other chronic pain: Secondary | ICD-10-CM | POA: Diagnosis not present

## 2024-04-10 DIAGNOSIS — R2689 Other abnormalities of gait and mobility: Secondary | ICD-10-CM

## 2024-04-10 DIAGNOSIS — M25552 Pain in left hip: Secondary | ICD-10-CM | POA: Diagnosis not present

## 2024-04-10 DIAGNOSIS — M25562 Pain in left knee: Secondary | ICD-10-CM | POA: Diagnosis not present

## 2024-04-10 DIAGNOSIS — M79605 Pain in left leg: Secondary | ICD-10-CM

## 2024-04-10 NOTE — Patient Instructions (Signed)
 Access Code: [email or web address] URL: [email or web address] Date: 04/10/2024 Prepared by: Leah Primus  Exercises - Modified Andy Bannister Stretch  - 1-2 x daily - 2 reps - 60 seconds hold - Supine Posterior Pelvic Tilt  - 1-2 x daily - 2 sets - 10 reps - 5 seconds hold - Supine Quad Set  - 1-2 x daily - 2 sets - 10 reps - 5 seconds hold - Clam  - 1-2 x daily - 2 sets - 10 reps - Prone Hip Extension with Plantarflexion  - 1-2 x daily - 2 sets - 10 reps

## 2024-04-10 NOTE — Therapy (Signed)
 OUTPATIENT PHYSICAL THERAPY EVALUATION   Patient Name: Deborah Barton MRN: [id number] DOB:1946/01/08, 77 y.o., female Today's Date: 04/11/2024   END OF SESSION:  PT End of Session - 04/10/24 1445     Visit Number 1    Number of Visits 9    Date for PT Re-Evaluation 06/05/24    Authorization Type [employer] MCR    Progress Note Due on Visit 10    PT Start Time 1430    PT Stop Time 1515    PT Time Calculation (min) 45 min    Activity Tolerance Patient tolerated treatment well    Behavior During Therapy WFL for tasks assessed/performed             Past Medical History:  Diagnosis Date   Acid reflux    hx of   Anemia    hx of-   Anxiety    PRN meds   Arthritis    RIGHT knee   Cataract    bilateral sx   Hypertension    situational - not taking med   Neuropathy    Past Surgical History:  Procedure Laterality Date   ABDOMINAL HYSTERECTOMY  1994   CHOLECYSTECTOMY  1975   Patient Active Problem List   Diagnosis Date Noted   GERD (gastroesophageal reflux disease) 04/09/2024   Pulsatile tinnitus, left ear 12/06/2023   Sensorineural hearing loss, bilateral 12/06/2023   Left shoulder pain 07/07/2022   Needle phobia 01/14/2021   Forgetfulness 06/20/2018   OA (osteoarthritis) of knee 01/05/2018   Left knee pain 10/11/2017   Routine general medical examination at a health care facility 11/04/2015   Essential hypertension 01/27/2015   Hereditary and idiopathic peripheral neuropathy 01/27/2015   Generalized anxiety disorder 01/27/2015    PCP: Adelia Homestead, MD  REFERRING PROVIDER: Syliva Even, MD  REFERRING DIAG: Left hip pain; Chronic pain of both knees; Lumbar radiculopathy  THERAPY DIAG:  Chronic pain of left knee  Pain in left hip  Pain in left leg  Other abnormalities of gait and mobility  Muscle weakness (generalized)  Rationale for Evaluation and Treatment: Rehabilitation  ONSET DATE: Chronic, 3 years   SUBJECTIVE:  SUBJECTIVE  STATEMENT: Patient reports left knee pain that travels from the left hip down the thigh to the knee and lower leg. She does have some right knee pain as well but it is not as bad. She reports that if she sits for a while, when she goes to stand she has to stand still for a minutes before she can start walking because the left leg won't let her walk and it feels stiff. This has been going on for a couple years with muscle spasms in her legs when she would be stooped for a while, but since then the left knee has been giving her more trouble. She used to do exercises where she was walking, the bike, and some abdominal exercises so would like to know what she can do without making things worse. She does report difficulty lying on the left side. She has had injections in both knees and a recent epidural which did provide some pain relief.  PERTINENT HISTORY: See PMH above  PAIN:  Are you having pain? Yes:  NPRS scale: 6/10 Pain location: Left knee and hip Pain description: Stiffness Aggravating factors: Sitting for long periods and then getting up to walk Relieving factors: Medication  PRECAUTIONS: None  RED FLAGS: None   WEIGHT BEARING RESTRICTIONS: No  FALLS:  Has patient fallen  in last 6 months? No  PLOF: Independent  PATIENT GOALS: Pain relief and reduce left leg stiffness with walking   OBJECTIVE:  Note: Objective measures were completed at Evaluation unless otherwise noted. DIAGNOSTIC FINDINGS:   Lumbar MRI 03/03/2024 IMPRESSION: 1. Short pedicles and degeneration causes spinal stenosis that is advanced at L4-5 and moderate at L3-4. 2. Up to moderate foraminal narrowing at L4-5 and L5-S1.  PATIENT SURVEYS:  PSFS: 6.4 Walking after sitting for a while: 5 Bending working in yard: 8 Exercise on left side: 7 Walking for a long period: 5 Wearing heels: 7  COGNITION: Overall cognitive status: Within functional limits for tasks assessed   SENSATION: WFL  EDEMA:  Left knee  edema noted compared to right  MUSCLE LENGTH: Limitations with bilateral hamstrings, left hip flexor/quad  POSTURE:   Patient exhibits decreased knee extension on left, weight shift to right  PALPATION: Tender to palpation peripatellar region, left lateral quad/ITB, left lateral gluteal region  LUMBAR ROM:   Active  A/PROM  eval  Flexion WFL  Extension 75%  Right lateral flexion   Left lateral flexion   Right rotation WFL  Left rotation WFL   (Blank rows = not tested)  LOWER EXTREMITY ROM:  Active ROM Right eval Left eval  Hip flexion    Hip extension    Hip abduction    Hip adduction    Hip internal rotation    Hip external rotation    Knee flexion 130 120  Knee extension 0 5 lacking  Ankle dorsiflexion    Ankle plantarflexion    Ankle inversion    Ankle eversion     (Blank rows = not tested)  Bilateral hip ROM grossly WFL  LOWER EXTREMITY MMT:  MMT Right eval Left eval  Hip flexion 4 4-  Hip extension 3+ 3-  Hip abduction 3+ 3-  Hip adduction    Hip internal rotation    Hip external rotation    Knee flexion 4 4  Knee extension 4+ 4  Ankle dorsiflexion    Ankle plantarflexion    Ankle inversion    Ankle eversion     (Blank rows = not tested)  SPECIAL TESTS:  SLR positive on left  FUNCTIONAL TESTS:  5xSTS: 23 seconds  GAIT: Assistive device utilized: None Level of assistance: Complete Independence Comments: Antalgic on left                                                                                                                               TREATMENT [facility] Adult PT Treatment:                                                DATE: 04/10/2024 Modified thomas stretch Posterior pelvic tilt Quad set Side clamshell Prone hip extension  PATIENT EDUCATION:  Education details: Exam findings, POC, HEP Person educated: Patient Education method: Explanation, Demonstration, Tactile cues, Verbal cues, and Handouts Education comprehension:  verbalized understanding, returned demonstration, verbal cues required, tactile cues required, and needs further education  HOME EXERCISE PROGRAM: Access Code: [email or web address]    ASSESSMENT: CLINICAL IMPRESSION: Patient is a 78 y.o. female who was seen today for physical therapy evaluation and treatment for chronic left leg pain that is primarily in the left knee. She does exhibit some left sided radicular symptoms. She exhibits limitations in her left knee motion and strength deficits of hip and knee greater on the the side. She also exhibit flexibility deficits greater on the left and tenderness of the left gluteal, lateral thigh, and knee region that is likely contributing to her pain and impacting her functional ability.    OBJECTIVE IMPAIRMENTS: Abnormal gait, decreased activity tolerance, decreased ROM, decreased strength, impaired flexibility, and pain.   ACTIVITY LIMITATIONS: lifting, bending, sitting, standing, squatting, sleeping, stairs, and locomotion level  PARTICIPATION LIMITATIONS: cleaning, shopping, community activity, and yard work  PERSONAL FACTORS: Fitness, Past/current experiences, and Time since onset of injury/illness/exacerbation are also affecting patient's functional outcome.   REHAB POTENTIAL: Good  CLINICAL DECISION MAKING: Stable/uncomplicated  EVALUATION COMPLEXITY: Low   GOALS: Goals reviewed with patient? Yes  SHORT TERM GOALS: Target date: 05/08/2024  Patient will be I with initial HEP in order to progress with therapy. Baseline: HEP provided at eval Goal status: INITIAL  2.  Patient will report left leg pain </= 4/10 in order to reduce functional limitations Baseline: 6/10 Goal status: INITIAL  3.  Patient will exhibit left knee extension to 0 deg in order to improve gait and mobility Baseline: lacking 5 deg Goal status: INITIAL  LONG TERM GOALS: Target date: 06/05/2024  Patient will be I with final HEP to maintain progress from PT. Baseline: HEP  provided at eval Goal status: INITIAL  2.  Patient will report PSFS >/= 8 in order to indicate improvement in her functional ability Baseline: 6.4 Goal status: INITIAL  3.  Patient will demonstrate hip strength >/= 4-/5 MMT in order to improve her ability to initiate walking and for lifting tasks Baseline: see limitations above Goal status: INITIAL  4.  Patient will demonstrate knee strength 5/5 MMT in order to improve her walking and activity tolerance Baseline: see limitations above Goal status: INITIAL  5. Patient will demonstrate 5xSTS </= 12 seconds to indicate improved strength and mobility  Baseline: 23 seconds  Goal status: INITIAL   PLAN: PT FREQUENCY: 1x/week  PT DURATION: 8 weeks  PLANNED INTERVENTIONS: 97164- PT Re-evaluation, 97110-Therapeutic exercises, 97530- Therapeutic activity, 97112- Neuromuscular re-education, 97535- Self Care, 16109- Manual therapy, [phone]- Gait training, Patient/Family education, Balance training, Taping, Dry Needling, Joint mobilization, Joint manipulation, Spinal manipulation, Spinal mobilization, Cryotherapy, and Moist heat  PLAN FOR NEXT SESSION: Review HEP and progress PRN, manual/TPDN for left lateral hip and thigh region, manual for left knee mobility, address flexibility deficits of the left hip and thigh, progress core stabilization, hip and knee strengthening    Leah Primus, PT, DPT, LAT, ATC 04/11/24  8:03 AM Phone: [phone] Fax: [phone]

## 2024-04-18 NOTE — Therapy (Signed)
 OUTPATIENT PHYSICAL THERAPY TREATMENT   Patient Name: Deborah Barton MRN: [id number] DOB:10-28-1946, 78 y.o., female Today's Date: 04/19/2024   END OF SESSION:  PT End of Session - 04/19/24 1207     Visit Number 2    Number of Visits 9    Date for PT Re-Evaluation 06/05/24    Authorization Type [employer] MCR    Progress Note Due on Visit 10    PT Start Time 1155    PT Stop Time 1235    PT Time Calculation (min) 40 min    Activity Tolerance Patient tolerated treatment well    Behavior During Therapy WFL for tasks assessed/performed              Past Medical History:  Diagnosis Date   Acid reflux    hx of   Anemia    hx of-   Anxiety    PRN meds   Arthritis    RIGHT knee   Cataract    bilateral sx   Hypertension    situational - not taking med   Neuropathy    Past Surgical History:  Procedure Laterality Date   ABDOMINAL HYSTERECTOMY  1994   CHOLECYSTECTOMY  1975   Patient Active Problem List   Diagnosis Date Noted   GERD (gastroesophageal reflux disease) 04/09/2024   Pulsatile tinnitus, left ear 12/06/2023   Sensorineural hearing loss, bilateral 12/06/2023   Left shoulder pain 07/07/2022   Needle phobia 01/14/2021   Forgetfulness 06/20/2018   OA (osteoarthritis) of knee 01/05/2018   Left knee pain 10/11/2017   Routine general medical examination at a health care facility 11/04/2015   Essential hypertension 01/27/2015   Hereditary and idiopathic peripheral neuropathy 01/27/2015   Generalized anxiety disorder 01/27/2015    PCP: Adelia Homestead, MD  REFERRING PROVIDER: Syliva Even, MD  REFERRING DIAG: Left hip pain; Chronic pain of both knees; Lumbar radiculopathy  THERAPY DIAG:  Chronic pain of left knee  Pain in left hip  Pain in left leg  Other abnormalities of gait and mobility  Muscle weakness (generalized)  Rationale for Evaluation and Treatment: Rehabilitation  ONSET DATE: Chronic, 3 years   SUBJECTIVE:  SUBJECTIVE  STATEMENT: Patient reports she will feel better at times, but she will have pains at night in the left leg. She can sleep on the left side a little longer and the pain traveling down the thigh has gotten better.   Eval: Patient reports left knee pain that travels from the left hip down the thigh to the knee and lower leg. She does have some right knee pain as well but it is not as bad. She reports that if she sits for a while, when she goes to stand she has to stand still for a minutes before she can start walking because the left leg won't let her walk and it feels stiff. This has been going on for a couple years with muscle spasms in her legs when she would be stooped for a while, but since then the left knee has been giving her more trouble. She used to do exercises where she was walking, the bike, and some abdominal exercises so would like to know what she can do without making things worse. She does report difficulty lying on the left side. She has had injections in both knees and a recent epidural which did provide some pain relief.  PERTINENT HISTORY: See PMH above  PAIN:  Are you having pain? Yes:  NPRS scale: 5/10  Pain location: Left knee and hip Pain description: Stiffness Aggravating factors: Sitting for long periods and then getting up to walk Relieving factors: Medication  PRECAUTIONS: None  PATIENT GOALS: Pain relief and reduce left leg stiffness with walking   OBJECTIVE:  Note: Objective measures were completed at Evaluation unless otherwise noted. DIAGNOSTIC FINDINGS:   Lumbar MRI 03/03/2024 IMPRESSION: 1. Short pedicles and degeneration causes spinal stenosis that is advanced at L4-5 and moderate at L3-4. 2. Up to moderate foraminal narrowing at L4-5 and L5-S1.  PATIENT SURVEYS:  PSFS: 6.4 Walking after sitting for a while: 5 Bending working in yard: 8 Exercise on left side: 7 Walking for a long period: 5 Wearing heels: 7  EDEMA:  Left knee edema noted compared  to right  MUSCLE LENGTH: Limitations with bilateral hamstrings, left hip flexor/quad  POSTURE:   Patient exhibits decreased knee extension on left, weight shift to right  PALPATION: Tender to palpation peripatellar region, left lateral quad/ITB, left lateral gluteal region  LUMBAR ROM:   Active  A/PROM  eval  Flexion WFL  Extension 75%  Right lateral flexion   Left lateral flexion   Right rotation WFL  Left rotation WFL   (Blank rows = not tested)  LOWER EXTREMITY ROM:  Active ROM Right eval Left eval Left 04/19/2024  Hip flexion     Hip extension     Hip abduction     Hip adduction     Hip internal rotation     Hip external rotation     Knee flexion 130 120   Knee extension 0 5 lacking 5 lacking  Ankle dorsiflexion     Ankle plantarflexion     Ankle inversion     Ankle eversion      (Blank rows = not tested)  Bilateral hip ROM grossly WFL  LOWER EXTREMITY MMT:  MMT Right eval Left eval  Hip flexion 4 4-  Hip extension 3+ 3-  Hip abduction 3+ 3-  Hip adduction    Hip internal rotation    Hip external rotation    Knee flexion 4 4  Knee extension 4+ 4  Ankle dorsiflexion    Ankle plantarflexion    Ankle inversion    Ankle eversion     (Blank rows = not tested)  SPECIAL TESTS:  SLR positive on left  FUNCTIONAL TESTS:  5xSTS: 23 seconds  GAIT: Assistive device utilized: None Level of assistance: Complete Independence Comments: Antalgic on left                                                                                                                               TREATMENT [facility] Adult PT Treatment:  DATE: 04/19/2024 Recumbent bike L2 x 5 min to improve endurance and workload capacity Modified thomas stretch 3 x 20 sec each SLR 2 x 10 each Posterior pelvic tilt 10 x 5 sec Bridge with ball squeeze 2 x 10 Side clamshell with yellow 2 x 10 each Sit to stand x 10  PATIENT EDUCATION:  Education  details: HEP update Person educated: Patient Education method: Explanation, Demonstration, Tactile cues, Verbal cues, and Handouts Education comprehension: verbalized understanding, returned demonstration, verbal cues required, tactile cues required, and needs further education  HOME EXERCISE PROGRAM: Access Code: [email or web address]    ASSESSMENT: CLINICAL IMPRESSION: Patient tolerated therapy well with no adverse effects. She arrives reporting slight improvement in her symptoms but still with primarily left sided thigh and knee pain that comes and goes. Therapy focused primarily on progressing her core, hip, and LE strengthening with good tolerance. She did report greater difficulty and knee pain on the left with exercises. She continues to exhibit knee extension deficit on the left Updated her HEP to progress her hip and LE strengthening for home. Patient would benefit from continued skilled PT to progress mobility and strength in order to reduce pain and maximize functional ability.   Eval: Patient is a 78 y.o. female who was seen today for physical therapy evaluation and treatment for chronic left leg pain that is primarily in the left knee. She does exhibit some left sided radicular symptoms. She exhibits limitations in her left knee motion and strength deficits of hip and knee greater on the the side. She also exhibit flexibility deficits greater on the left and tenderness of the left gluteal, lateral thigh, and knee region that is likely contributing to her pain and impacting her functional ability.    OBJECTIVE IMPAIRMENTS: Abnormal gait, decreased activity tolerance, decreased ROM, decreased strength, impaired flexibility, and pain.   ACTIVITY LIMITATIONS: lifting, bending, sitting, standing, squatting, sleeping, stairs, and locomotion level  PARTICIPATION LIMITATIONS: cleaning, shopping, community activity, and yard work  PERSONAL FACTORS: Fitness, Past/current experiences, and Time since onset  of injury/illness/exacerbation are also affecting patient's functional outcome.    GOALS: Goals reviewed with patient? Yes  SHORT TERM GOALS: Target date: 05/08/2024  Patient will be I with initial HEP in order to progress with therapy. Baseline: HEP provided at eval Goal status: INITIAL  2.  Patient will report left leg pain </= 4/10 in order to reduce functional limitations Baseline: 6/10 Goal status: INITIAL  3.  Patient will exhibit left knee extension to 0 deg in order to improve gait and mobility Baseline: lacking 5 deg Goal status: INITIAL  LONG TERM GOALS: Target date: 06/05/2024  Patient will be I with final HEP to maintain progress from PT. Baseline: HEP provided at eval Goal status: INITIAL  2.  Patient will report PSFS >/= 8 in order to indicate improvement in her functional ability Baseline: 6.4 Goal status: INITIAL  3.  Patient will demonstrate hip strength >/= 4-/5 MMT in order to improve her ability to initiate walking and for lifting tasks Baseline: see limitations above Goal status: INITIAL  4.  Patient will demonstrate knee strength 5/5 MMT in order to improve her walking and activity tolerance Baseline: see limitations above Goal status: INITIAL  5. Patient will demonstrate 5xSTS </= 12 seconds to indicate improved strength and mobility  Baseline: 23 seconds  Goal status: INITIAL   PLAN: PT FREQUENCY: 1x/week  PT DURATION: 8 weeks  PLANNED INTERVENTIONS: 97164- PT Re-evaluation, 97110-Therapeutic exercises, 97530- Therapeutic activity, [id number]- Neuromuscular re-education, 97535-  Self Care, [id number]- Manual therapy, [phone]- Gait training, Patient/Family education, Balance training, Taping, Dry Needling, Joint mobilization, Joint manipulation, Spinal manipulation, Spinal mobilization, Cryotherapy, and Moist heat  PLAN FOR NEXT SESSION: Review HEP and progress PRN, manual/TPDN for left lateral hip and thigh region, manual for left knee mobility, address  flexibility deficits of the left hip and thigh, progress core stabilization, hip and knee strengthening    Leah Primus, PT, DPT, LAT, ATC 04/19/24  1:02 PM Phone: [phone] Fax: [phone]

## 2024-04-19 ENCOUNTER — Encounter: Payer: Self-pay | Admitting: Physical Therapy

## 2024-04-19 ENCOUNTER — Other Ambulatory Visit: Payer: Self-pay

## 2024-04-19 ENCOUNTER — Ambulatory Visit (INDEPENDENT_AMBULATORY_CARE_PROVIDER_SITE_OTHER): Admitting: Physical Therapy

## 2024-04-19 DIAGNOSIS — R2689 Other abnormalities of gait and mobility: Secondary | ICD-10-CM | POA: Diagnosis not present

## 2024-04-19 DIAGNOSIS — M25562 Pain in left knee: Secondary | ICD-10-CM

## 2024-04-19 DIAGNOSIS — G8929 Other chronic pain: Secondary | ICD-10-CM | POA: Diagnosis not present

## 2024-04-19 DIAGNOSIS — M79605 Pain in left leg: Secondary | ICD-10-CM

## 2024-04-19 DIAGNOSIS — M6281 Muscle weakness (generalized): Secondary | ICD-10-CM | POA: Diagnosis not present

## 2024-04-19 DIAGNOSIS — M25552 Pain in left hip: Secondary | ICD-10-CM

## 2024-04-19 NOTE — Patient Instructions (Signed)
 Access Code: [email or web address] URL: [email or web address] Date: 04/19/2024 Prepared by: Leah Primus  Exercises - Modified Andy Bannister Stretch  - 1-2 x daily - 2 reps - 60 seconds hold - Supine Posterior Pelvic Tilt  - 1-2 x daily - 2 sets - 10 reps - 5 seconds hold - Supine Quad Set  - 1-2 x daily - 2 sets - 10 reps - 5 seconds hold - Active Straight Leg Raise with Quad Set  - 1-2 x daily - 2 sets - 10 reps - Clam with Resistance  - 1-2 x daily - 2 sets - 10 reps - Prone Hip Extension with Plantarflexion  - 1-2 x daily - 2 sets - 10 reps

## 2024-04-25 ENCOUNTER — Other Ambulatory Visit: Payer: Self-pay

## 2024-04-25 ENCOUNTER — Ambulatory Visit: Admitting: Physical Therapy

## 2024-04-25 ENCOUNTER — Encounter: Payer: Self-pay | Admitting: Physical Therapy

## 2024-04-25 DIAGNOSIS — G8929 Other chronic pain: Secondary | ICD-10-CM

## 2024-04-25 DIAGNOSIS — M25562 Pain in left knee: Secondary | ICD-10-CM | POA: Diagnosis not present

## 2024-04-25 DIAGNOSIS — R2689 Other abnormalities of gait and mobility: Secondary | ICD-10-CM | POA: Diagnosis not present

## 2024-04-25 DIAGNOSIS — M79605 Pain in left leg: Secondary | ICD-10-CM | POA: Diagnosis not present

## 2024-04-25 DIAGNOSIS — M6281 Muscle weakness (generalized): Secondary | ICD-10-CM | POA: Diagnosis not present

## 2024-04-25 DIAGNOSIS — M25552 Pain in left hip: Secondary | ICD-10-CM

## 2024-04-25 NOTE — Patient Instructions (Signed)
 Access Code: [email or web address] URL: [email or web address] Date: 04/25/2024 Prepared by: Leah Primus  Exercises - Modified Andy Bannister Stretch  - 1-2 x daily - 2 reps - 60 seconds hold - Supine Posterior Pelvic Tilt  - 1-2 x daily - 2 sets - 10 reps - 5 seconds hold - Supine Quad Set  - 1-2 x daily - 2 sets - 10 reps - 5 seconds hold - Active Straight Leg Raise with Quad Set  - 1-2 x daily - 2 sets - 10 reps - Clam with Resistance  - 1-2 x daily - 2 sets - 10 reps - Prone Hip Extension with Plantarflexion  - 1-2 x daily - 2 sets - 10 reps - Sit to Stand  - 1-2 x daily - 3 sets - 10 reps

## 2024-04-25 NOTE — Therapy (Signed)
 OUTPATIENT PHYSICAL THERAPY TREATMENT   Patient Name: Deborah Barton MRN: [id number] DOB:05/02/1946, 78 y.o., female Today's Date: 04/25/2024   END OF SESSION:  PT End of Session - 04/25/24 1117     Visit Number 3    Number of Visits 9    Date for PT Re-Evaluation 06/05/24    Authorization Type [employer] MCR    Authorization Time Period 04/10/2024 - 06/05/2024    Authorization - Visit Number 3    Authorization - Number of Visits 9    Progress Note Due on Visit 10    PT Start Time 1108    PT Stop Time 1149    PT Time Calculation (min) 41 min    Activity Tolerance Patient tolerated treatment well    Behavior During Therapy WFL for tasks assessed/performed               Past Medical History:  Diagnosis Date   Acid reflux    hx of   Anemia    hx of-   Anxiety    PRN meds   Arthritis    RIGHT knee   Cataract    bilateral sx   Hypertension    situational - not taking med   Neuropathy    Past Surgical History:  Procedure Laterality Date   ABDOMINAL HYSTERECTOMY  1994   CHOLECYSTECTOMY  1975   Patient Active Problem List   Diagnosis Date Noted   GERD (gastroesophageal reflux disease) 04/09/2024   Pulsatile tinnitus, left ear 12/06/2023   Sensorineural hearing loss, bilateral 12/06/2023   Left shoulder pain 07/07/2022   Needle phobia 01/14/2021   Forgetfulness 06/20/2018   OA (osteoarthritis) of knee 01/05/2018   Left knee pain 10/11/2017   Routine general medical examination at a health care facility 11/04/2015   Essential hypertension 01/27/2015   Hereditary and idiopathic peripheral neuropathy 01/27/2015   Generalized anxiety disorder 01/27/2015    PCP: Adelia Homestead, MD  REFERRING PROVIDER: Syliva Even, MD  REFERRING DIAG: Left hip pain; Chronic pain of both knees; Lumbar radiculopathy  THERAPY DIAG:  Chronic pain of left knee  Pain in left hip  Pain in left leg  Other abnormalities of gait and mobility  Muscle weakness  (generalized)  Rationale for Evaluation and Treatment: Rehabilitation  ONSET DATE: Chronic, 3 years   SUBJECTIVE:  SUBJECTIVE STATEMENT: Patient arrives reporting soreness of the left hip, thigh, and knee.   Eval: Patient reports left knee pain that travels from the left hip down the thigh to the knee and lower leg. She does have some right knee pain as well but it is not as bad. She reports that if she sits for a while, when she goes to stand she has to stand still for a minutes before she can start walking because the left leg won't let her walk and it feels stiff. This has been going on for a couple years with muscle spasms in her legs when she would be stooped for a while, but since then the left knee has been giving her more trouble. She used to do exercises where she was walking, the bike, and some abdominal exercises so would like to know what she can do without making things worse. She does report difficulty lying on the left side. She has had injections in both knees and a recent epidural which did provide some pain relief.  PERTINENT HISTORY: See PMH above  PAIN:  Are you having pain? Yes:  NPRS scale: 5/10 Pain  location: Left knee and hip Pain description: Stiffness Aggravating factors: Sitting for long periods and then getting up to walk Relieving factors: Medication  PRECAUTIONS: None  PATIENT GOALS: Pain relief and reduce left leg stiffness with walking   OBJECTIVE:  Note: Objective measures were completed at Evaluation unless otherwise noted. DIAGNOSTIC FINDINGS:   Lumbar MRI 03/03/2024 IMPRESSION: 1. Short pedicles and degeneration causes spinal stenosis that is advanced at L4-5 and moderate at L3-4. 2. Up to moderate foraminal narrowing at L4-5 and L5-S1.  PATIENT SURVEYS:  PSFS: 6.4 Walking after sitting for a while: 5 Bending working in yard: 8 Exercise on left side: 7 Walking for a long period: 5 Wearing heels: 7  EDEMA:  Left knee edema noted compared  to right  MUSCLE LENGTH: Limitations with bilateral hamstrings, left hip flexor/quad  POSTURE:   Patient exhibits decreased knee extension on left, weight shift to right  PALPATION: Tender to palpation peripatellar region, left lateral quad/ITB, left lateral gluteal region  LUMBAR ROM:   Active  A/PROM  eval  Flexion WFL  Extension 75%  Right lateral flexion   Left lateral flexion   Right rotation WFL  Left rotation WFL   (Blank rows = not tested)  LOWER EXTREMITY ROM:  Active ROM Right eval Left eval Left 04/19/2024  Hip flexion     Hip extension     Hip abduction     Hip adduction     Hip internal rotation     Hip external rotation     Knee flexion 130 120   Knee extension 0 5 lacking 5 lacking  Ankle dorsiflexion     Ankle plantarflexion     Ankle inversion     Ankle eversion      (Blank rows = not tested)  Bilateral hip ROM grossly WFL  LOWER EXTREMITY MMT:  MMT Right eval Left eval Left 04/25/2024  Hip flexion 4 4-   Hip extension 3+ 3-   Hip abduction 3+ 3- 3+  Hip adduction     Hip internal rotation     Hip external rotation     Knee flexion 4 4   Knee extension 4+ 4   Ankle dorsiflexion     Ankle plantarflexion     Ankle inversion     Ankle eversion      (Blank rows = not tested)  SPECIAL TESTS:  SLR positive on left  FUNCTIONAL TESTS:  5xSTS: 23 seconds  GAIT: Assistive device utilized: None Level of assistance: Complete Independence Comments: Antalgic on left                                                                                                                               TREATMENT [facility] Adult PT Treatment:  DATE: 04/25/2024 Recumbent bike L4 x 5 min to improve endurance and workload capacity Hooklying left knee mobs and supine left patellar mobs STM to left quad, adductor, ITB, and glutea region using FR and theragun Modified thomas stretch 3 x 20 sec each Bridge 2 x  10 SLR 2 x 10 with left Sidelying hip abduction 2 x 10 with left LAQ with 3# 2 x 10 each Sit to stand 2 x 10  PATIENT EDUCATION:  Education details: HEP update, she could use knee sleeve for swelling management and support with activity Person educated: Patient Education method: Explanation, Demonstration, Tactile cues, Verbal cues, and Handouts Education comprehension: verbalized understanding, returned demonstration, verbal cues required, tactile cues required, and needs further education  HOME EXERCISE PROGRAM: Access Code: [email or web address]    ASSESSMENT: CLINICAL IMPRESSION: Patient tolerated therapy well with no adverse effects. Therapy focused on improving left knee mobility, reducing muscle tension of the left thigh, and progressing her strength with good tolerance. She does exhibit improved left hip strength this visit. Patient does report some increased soreness when performing exercises at home but she did state she felt better post session this visit. She was able to progress with her hip and knee strengthening. Updated her HEP to progress her hip and LE strengthening for home. Patient would benefit from continued skilled PT to progress mobility and strength in order to reduce pain and maximize functional ability.   Eval: Patient is a 78 y.o. female who was seen today for physical therapy evaluation and treatment for chronic left leg pain that is primarily in the left knee. She does exhibit some left sided radicular symptoms. She exhibits limitations in her left knee motion and strength deficits of hip and knee greater on the the side. She also exhibit flexibility deficits greater on the left and tenderness of the left gluteal, lateral thigh, and knee region that is likely contributing to her pain and impacting her functional ability.    OBJECTIVE IMPAIRMENTS: Abnormal gait, decreased activity tolerance, decreased ROM, decreased strength, impaired flexibility, and pain.   ACTIVITY  LIMITATIONS: lifting, bending, sitting, standing, squatting, sleeping, stairs, and locomotion level  PARTICIPATION LIMITATIONS: cleaning, shopping, community activity, and yard work  PERSONAL FACTORS: Fitness, Past/current experiences, and Time since onset of injury/illness/exacerbation are also affecting patient's functional outcome.    GOALS: Goals reviewed with patient? Yes  SHORT TERM GOALS: Target date: 05/08/2024  Patient will be I with initial HEP in order to progress with therapy. Baseline: HEP provided at eval Goal status: INITIAL  2.  Patient will report left leg pain </= 4/10 in order to reduce functional limitations Baseline: 6/10 Goal status: INITIAL  3.  Patient will exhibit left knee extension to 0 deg in order to improve gait and mobility Baseline: lacking 5 deg Goal status: INITIAL  LONG TERM GOALS: Target date: 06/05/2024  Patient will be I with final HEP to maintain progress from PT. Baseline: HEP provided at eval Goal status: INITIAL  2.  Patient will report PSFS >/= 8 in order to indicate improvement in her functional ability Baseline: 6.4 Goal status: INITIAL  3.  Patient will demonstrate hip strength >/= 4-/5 MMT in order to improve her ability to initiate walking and for lifting tasks Baseline: see limitations above Goal status: INITIAL  4.  Patient will demonstrate knee strength 5/5 MMT in order to improve her walking and activity tolerance Baseline: see limitations above Goal status: INITIAL  5. Patient will demonstrate 5xSTS </= 12 seconds to indicate improved  strength and mobility  Baseline: 23 seconds  Goal status: INITIAL   PLAN: PT FREQUENCY: 1x/week  PT DURATION: 8 weeks  PLANNED INTERVENTIONS: 97164- PT Re-evaluation, 97110-Therapeutic exercises, 97530- Therapeutic activity, 97112- Neuromuscular re-education, 97535- Self Care, 25956- Manual therapy, [id number]- Gait training, Patient/Family education, Balance training, Taping, Dry  Needling, Joint mobilization, Joint manipulation, Spinal manipulation, Spinal mobilization, Cryotherapy, and Moist heat  PLAN FOR NEXT SESSION: Review HEP and progress PRN, manual/TPDN for left lateral hip and thigh region, manual for left knee mobility, address flexibility deficits of the left hip and thigh, progress core stabilization, hip and knee strengthening    Leah Primus, PT, DPT, LAT, ATC 04/25/24  1:23 PM Phone: [phone] Fax: [phone]

## 2024-04-27 ENCOUNTER — Other Ambulatory Visit: Payer: Self-pay | Admitting: Internal Medicine

## 2024-04-27 DIAGNOSIS — Z1231 Encounter for screening mammogram for malignant neoplasm of breast: Secondary | ICD-10-CM

## 2024-05-01 ENCOUNTER — Other Ambulatory Visit: Payer: Self-pay

## 2024-05-01 ENCOUNTER — Encounter: Payer: Self-pay | Admitting: Physical Therapy

## 2024-05-01 ENCOUNTER — Ambulatory Visit: Admitting: Physical Therapy

## 2024-05-01 DIAGNOSIS — R2689 Other abnormalities of gait and mobility: Secondary | ICD-10-CM

## 2024-05-01 DIAGNOSIS — M25562 Pain in left knee: Secondary | ICD-10-CM

## 2024-05-01 DIAGNOSIS — M6281 Muscle weakness (generalized): Secondary | ICD-10-CM | POA: Diagnosis not present

## 2024-05-01 DIAGNOSIS — M79605 Pain in left leg: Secondary | ICD-10-CM

## 2024-05-01 DIAGNOSIS — M25552 Pain in left hip: Secondary | ICD-10-CM

## 2024-05-01 DIAGNOSIS — G8929 Other chronic pain: Secondary | ICD-10-CM

## 2024-05-01 NOTE — Therapy (Signed)
 OUTPATIENT PHYSICAL THERAPY TREATMENT   Patient Name: Deborah Barton MRN: [id number] DOB:[birth year], 78 y.o., female Today's Date: 05/01/2024   END OF SESSION:  PT End of Session - 05/01/24 1306     Visit Number 4    Number of Visits 9    Date for PT Re-Evaluation 06/05/24    Authorization Type [employer] MCR    Authorization Time Period 04/10/2024 - 06/05/2024    Authorization - Visit Number 4    Authorization - Number of Visits 9    Progress Note Due on Visit 10    PT Start Time 1300    PT Stop Time 1342    PT Time Calculation (min) 42 min    Activity Tolerance Patient tolerated treatment well    Behavior During Therapy WFL for tasks assessed/performed                Past Medical History:  Diagnosis Date   Acid reflux    hx of   Anemia    hx of-   Anxiety    PRN meds   Arthritis    RIGHT knee   Cataract    bilateral sx   Hypertension    situational - not taking med   Neuropathy    Past Surgical History:  Procedure Laterality Date   ABDOMINAL HYSTERECTOMY  1994   CHOLECYSTECTOMY  1975   Patient Active Problem List   Diagnosis Date Noted   GERD (gastroesophageal reflux disease) 04/09/2024   Pulsatile tinnitus, left ear 12/06/2023   Sensorineural hearing loss, bilateral 12/06/2023   Left shoulder pain 07/07/2022   Needle phobia 01/14/2021   Forgetfulness 06/20/2018   OA (osteoarthritis) of knee 01/05/2018   Left knee pain 10/11/2017   Routine general medical examination at a health care facility 11/04/2015   Essential hypertension 01/27/2015   Hereditary and idiopathic peripheral neuropathy 01/27/2015   Generalized anxiety disorder 01/27/2015    PCP: Adelia Homestead, MD  REFERRING PROVIDER: Syliva Even, MD  REFERRING DIAG: Left hip pain; Chronic pain of both knees; Lumbar radiculopathy  THERAPY DIAG:  Chronic pain of left knee  Pain in left hip  Pain in left leg  Other abnormalities of gait and mobility  Muscle weakness  (generalized)  Rationale for Evaluation and Treatment: Rehabilitation  ONSET DATE: Chronic, 3 years   SUBJECTIVE:  SUBJECTIVE STATEMENT: Patient reports her pain is better and she feels like she is walking better. She is having most of her pain on the inside of the left knee and thigh.  Eval: Patient reports left knee pain that travels from the left hip down the thigh to the knee and lower leg. She does have some right knee pain as well but it is not as bad. She reports that if she sits for a while, when she goes to stand she has to stand still for a minutes before she can start walking because the left leg won't let her walk and it feels stiff. This has been going on for a couple years with muscle spasms in her legs when she would be stooped for a while, but since then the left knee has been giving her more trouble. She used to do exercises where she was walking, the bike, and some abdominal exercises so would like to know what she can do without making things worse. She does report difficulty lying on the left side. She has had injections in both knees and a recent epidural which did provide some pain relief.  PERTINENT HISTORY: See PMH above  PAIN:  Are you having pain? Yes:  NPRS scale: 6/10 Pain location: Left knee and hip Pain description: Stiffness Aggravating factors: Sitting for long periods and then getting up to walk Relieving factors: Medication  PRECAUTIONS: None  PATIENT GOALS: Pain relief and reduce left leg stiffness with walking   OBJECTIVE:  Note: Objective measures were completed at Evaluation unless otherwise noted. DIAGNOSTIC FINDINGS:   Lumbar MRI 03/03/2024 IMPRESSION: 1. Short pedicles and degeneration causes spinal stenosis that is advanced at L4-5 and moderate at L3-4. 2. Up to moderate foraminal narrowing at L4-5 and L5-S1.  PATIENT SURVEYS:  PSFS: 6.4 Walking after sitting for a while: 5 Bending working in yard: 8 Exercise on left side: 7 Walking  for a long period: 5 Wearing heels: 7  EDEMA:  Left knee edema noted compared to right  MUSCLE LENGTH: Limitations with bilateral hamstrings, left hip flexor/quad  POSTURE:   Patient exhibits decreased knee extension on left, weight shift to right  PALPATION: Tender to palpation peripatellar region, left lateral quad/ITB, left lateral gluteal region  LUMBAR ROM:   Active  A/PROM  eval  Flexion WFL  Extension 75%  Right lateral flexion   Left lateral flexion   Right rotation WFL  Left rotation WFL   (Blank rows = not tested)  LOWER EXTREMITY ROM:  Active ROM Right eval Left eval Left 04/19/2024  Hip flexion     Hip extension     Hip abduction     Hip adduction     Hip internal rotation     Hip external rotation     Knee flexion 130 120   Knee extension 0 5 lacking 5 lacking  Ankle dorsiflexion     Ankle plantarflexion     Ankle inversion     Ankle eversion      (Blank rows = not tested)  Bilateral hip ROM grossly WFL  LOWER EXTREMITY MMT:  MMT Right eval Left eval Left 04/25/2024  Hip flexion 4 4-   Hip extension 3+ 3-   Hip abduction 3+ 3- 3+  Hip adduction     Hip internal rotation     Hip external rotation     Knee flexion 4 4   Knee extension 4+ 4   Ankle dorsiflexion     Ankle plantarflexion     Ankle inversion     Ankle eversion      (Blank rows = not tested)  SPECIAL TESTS:  SLR positive on left  FUNCTIONAL TESTS:  5xSTS: 23 seconds  GAIT: Assistive device utilized: None Level of assistance: Complete Independence Comments: Antalgic on left                                                                                                                               TREATMENT [facility] Adult PT Treatment:  DATE: 05/01/2024 Recumbent bike L4 x 5 min to improve endurance and workload capacity STM to left quad, adductor, ITB, and gluteal region using theragun Modified thomas stretch 3 x 20 sec  each Supine hamstring, adductor, and ITB/glute stretch x 30 sec each SLR 2 x 10 with left Hookying adductor ball squeeze 2 x 10 x 5 sec Bridge with ball squeeze 2 x 10 Sidelying hip abduction 2 x 10 with left Side clamshell with yellow 2 x 10 Sit to stand 3 x 10 LAQ with 4# 2 x 10 each  Patient was educated on walking program at the gym, where she can walk laps and keep track of her distance to gauge her progress. Also discussed water aerobics for exercise in an environment that takes pressure off the joints.  PATIENT EDUCATION:  Education details: HEP update Person educated: Patient Education method: Explanation, Demonstration, Tactile cues, Verbal cues, and Handouts Education comprehension: verbalized understanding, returned demonstration, verbal cues required, tactile cues required, and needs further education  HOME EXERCISE PROGRAM: Access Code: [email or web address]    ASSESSMENT: CLINICAL IMPRESSION: Patient tolerated therapy well with no adverse effects. Continued with STM and stretching for the left thigh with patient reporting reduced tenderness and muscle tightness. Therapy continued to progress her hip and LE strengthening with addition of more adductor exercises as she was reporting more discomfort of the adductor muscles and medial knee this visit. She does report greater difficulty with exercises on the left but did not report any increase in pain this visit. Updated her HEP to progress strengthening for home. Patient would benefit from continued skilled PT to progress mobility and strength in order to reduce pain and maximize functional ability.   Eval: Patient is a 78 y.o. female who was seen today for physical therapy evaluation and treatment for chronic left leg pain that is primarily in the left knee. She does exhibit some left sided radicular symptoms. She exhibits limitations in her left knee motion and strength deficits of hip and knee greater on the the side. She also exhibit  flexibility deficits greater on the left and tenderness of the left gluteal, lateral thigh, and knee region that is likely contributing to her pain and impacting her functional ability.    OBJECTIVE IMPAIRMENTS: Abnormal gait, decreased activity tolerance, decreased ROM, decreased strength, impaired flexibility, and pain.   ACTIVITY LIMITATIONS: lifting, bending, sitting, standing, squatting, sleeping, stairs, and locomotion level  PARTICIPATION LIMITATIONS: cleaning, shopping, community activity, and yard work  PERSONAL FACTORS: Fitness, Past/current experiences, and Time since onset of injury/illness/exacerbation are also affecting patient's functional outcome.    GOALS: Goals reviewed with patient? Yes  SHORT TERM GOALS: Target date: 05/08/2024  Patient will be I with initial HEP in order to progress with therapy. Baseline: HEP provided at eval Goal status: INITIAL  2.  Patient will report left leg pain </= 4/10 in order to reduce functional limitations Baseline: 6/10 Goal status: INITIAL  3.  Patient will exhibit left knee extension to 0 deg in order to improve gait and mobility Baseline: lacking 5 deg Goal status: INITIAL  LONG TERM GOALS: Target date: 06/05/2024  Patient will be I with final HEP to maintain progress from PT. Baseline: HEP provided at eval Goal status: INITIAL  2.  Patient will report PSFS >/= 8 in order to indicate improvement in her functional ability Baseline: 6.4 Goal status: INITIAL  3.  Patient will demonstrate hip strength >/= 4-/5 MMT in order to improve her ability to initiate walking and for  lifting tasks Baseline: see limitations above Goal status: INITIAL  4.  Patient will demonstrate knee strength 5/5 MMT in order to improve her walking and activity tolerance Baseline: see limitations above Goal status: INITIAL  5. Patient will demonstrate 5xSTS </= 12 seconds to indicate improved strength and mobility  Baseline: 23 seconds  Goal  status: INITIAL   PLAN: PT FREQUENCY: 1x/week  PT DURATION: 8 weeks  PLANNED INTERVENTIONS: 97164- PT Re-evaluation, 97110-Therapeutic exercises, 97530- Therapeutic activity, 97112- Neuromuscular re-education, 97535- Self Care, 16109- Manual therapy, [phone]- Gait training, Patient/Family education, Balance training, Taping, Dry Needling, Joint mobilization, Joint manipulation, Spinal manipulation, Spinal mobilization, Cryotherapy, and Moist heat  PLAN FOR NEXT SESSION: Review HEP and progress PRN, manual/TPDN for left lateral hip and thigh region, manual for left knee mobility, address flexibility deficits of the left hip and thigh, progress core stabilization, hip and knee strengthening    Leah Primus, PT, DPT, LAT, ATC 05/01/24  1:48 PM Phone: [phone] Fax: [phone]

## 2024-05-03 ENCOUNTER — Ambulatory Visit: Admitting: Family Medicine

## 2024-05-03 ENCOUNTER — Encounter: Payer: Self-pay | Admitting: Family Medicine

## 2024-05-03 ENCOUNTER — Ambulatory Visit (INDEPENDENT_AMBULATORY_CARE_PROVIDER_SITE_OTHER)

## 2024-05-03 VITALS — BP 110/72 | HR 98 | Ht 62.0 in | Wt 128.0 lb

## 2024-05-03 DIAGNOSIS — G8929 Other chronic pain: Secondary | ICD-10-CM

## 2024-05-03 DIAGNOSIS — M25562 Pain in left knee: Secondary | ICD-10-CM

## 2024-05-03 DIAGNOSIS — M25561 Pain in right knee: Secondary | ICD-10-CM | POA: Diagnosis not present

## 2024-05-03 DIAGNOSIS — M25462 Effusion, left knee: Secondary | ICD-10-CM | POA: Diagnosis not present

## 2024-05-03 DIAGNOSIS — M1712 Unilateral primary osteoarthritis, left knee: Secondary | ICD-10-CM | POA: Diagnosis not present

## 2024-05-03 DIAGNOSIS — M170 Bilateral primary osteoarthritis of knee: Secondary | ICD-10-CM

## 2024-05-03 DIAGNOSIS — M1711 Unilateral primary osteoarthritis, right knee: Secondary | ICD-10-CM | POA: Diagnosis not present

## 2024-05-03 MED ORDER — MELOXICAM 15 MG PO TABS: 15.0000 mg | ORAL_TABLET | Freq: Every day | ORAL | 1 refills | Status: DC | PRN

## 2024-05-03 NOTE — Patient Instructions (Addendum)
 Thank you for coming in today.   Please get an Xray today before you leave   Call or go to the ER if you develop a large red swollen joint with extreme pain or oozing puss.    A prescription has been sent to your pharmacy for Meloxicam to take once daily as needed.   Deborah Barton   See you back as needed.   Have fun in [place]!

## 2024-05-03 NOTE — Progress Notes (Signed)
 I, Miquel Amen, CMA acting as a scribe for Garlan Juniper, MD.  Deborah Barton is a 78 y.o. female who presents to [facility] at [facility] today for f/u L hip, L knee, and thoracic back pain. Pt was last seen by Dr. Alease Hunter on 04/03/24 and was advised to restart PPI and f/u w/ PCP. Colchicine  was refilled and she was referred to PT, completing 4 visits.  Today, pt reports recent exacerbation of right knee pain. Notes continues left knee pain, medial aspect. Also notes continued pain radiating from the left hip to the lateral aspect of left knee. Having stiffness with ambulation. Also having some weakness in the L LE. Going for PT, some increased soreness initially following PT, but feels that it has been beneficial overall. Has tried ttaking Tylenol  ES. Taking Colchicine  for gout pain, doesn't think it has been helping. Has ben taking daily. Using heating pad and propping the knees on a pillow at night, continues to have pain despite this. Mid-back pain has been well-controlled.   Pertinent review of systems: No fevers or chills  Relevant historical information: Hypertension.   Exam:  BP 110/72   Pulse 98   Ht 5\' 2"  (1.575 m)   Wt 128 lb (58.1 kg)   SpO2 96%   BMI 23.41 kg/m  General: Well Developed, well nourished, and in no acute distress.   MSK: Knees bilaterally moderate effusion.  Normal motion.  Intact strength.    Lab and Radiology Results  Procedure: Real-time Ultrasound Guided Injection of right knee joint superior lateral patella space Device: Philips Affiniti 50G/GE Logiq Images permanently stored and available for review in PACS Verbal informed consent obtained.  Discussed risks and benefits of procedure. Warned about infection, bleeding, hyperglycemia damage to structures among others. Patient expresses understanding and agreement Time-out conducted.   Noted no overlying erythema, induration, or other signs of local infection.   Skin prepped  in a sterile fashion.   Local anesthesia: Topical Ethyl chloride.   With sterile technique and under real time ultrasound guidance: 40 mg of Kenalog  and 2 mL of Marcaine injected into knee joint. Fluid seen entering the joint capsule.   Completed without difficulty   Pain immediately resolved suggesting accurate placement of the medication.   Advised to call if fevers/chills, erythema, induration, drainage, or persistent bleeding.   Images permanently stored and available for review in the ultrasound unit.  Impression: Technically successful ultrasound guided injection.   Procedure: Real-time Ultrasound Guided Injection of left knee joint superior lateral patella space Device: Philips Affiniti 50G/GE Logiq Images permanently stored and available for review in PACS Verbal informed consent obtained.  Discussed risks and benefits of procedure. Warned about infection, bleeding, hyperglycemia damage to structures among others. Patient expresses understanding and agreement Time-out conducted.   Noted no overlying erythema, induration, or other signs of local infection.   Skin prepped in a sterile fashion.   Local anesthesia: Topical Ethyl chloride.   With sterile technique and under real time ultrasound guidance: 40 mg of Kenalog  and 2 mL of Marcaine injected into knee joint. Fluid seen entering the joint capsule.   Completed without difficulty   Pain immediately resolved suggesting accurate placement of the medication.   Advised to call if fevers/chills, erythema, induration, drainage, or persistent bleeding.   Images permanently stored and available for review in the ultrasound unit.  Impression: Technically successful ultrasound guided injection.  X-ray images bilateral knees obtained today personally and independently interpreted.  Right knee: Moderate  medial DJD.  Moderate patellofemoral DJD.  No acute fractures.  Chondrocalcinosis is present.  Left knee: Mild medial DJD.  Moderate  patellofemoral DJD.  No acute fractures.  Await formal radiology review     Assessment and Plan: 78 y.o. 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  Her x-ray has worsened over the last year.  Her pain also has worsened and developed an effusion.  Plan for bilateral steroid injections.  She has been trying colchicine  to treat presumed pseudogout.  This has not been helpful indicating pseudogout is unlikely.  Go ahead and discontinue colchicine .  I have prescribed some meloxicam  to use as well.  Check back as needed.   PDMP not reviewed this encounter. Orders Placed This Encounter  Procedures   DG Knee AP/LAT W/Sunrise Right    Standing Status:   Future    Number of Occurrences:   1    Expiration Date:   06/03/2024    Reason for Exam (SYMPTOM  OR DIAGNOSIS REQUIRED):   bilateral knee pain    Preferred imaging location?:   [facility]   DG Knee AP/LAT W/Sunrise Left    Standing Status:   Future    Number of Occurrences:   1    Expiration Date:   06/03/2024    Reason for Exam (SYMPTOM  OR DIAGNOSIS REQUIRED):   bilateral knee pain    Preferred imaging location?:   [facility]   Meds ordered this encounter  Medications   meloxicam  (MOBIC ) 15 MG tablet    Sig: Take 1 tablet (15 mg total) by mouth daily as needed for pain.    Dispense:  30 tablet    Refill:  1     Discussed warning signs or symptoms. Please see discharge instructions. Patient expresses understanding.   The above documentation has been reviewed and is accurate and complete Garlan Juniper, M.D.

## 2024-05-08 ENCOUNTER — Encounter: Payer: Self-pay | Admitting: Physical Therapy

## 2024-05-08 ENCOUNTER — Other Ambulatory Visit: Payer: Self-pay

## 2024-05-08 ENCOUNTER — Ambulatory Visit: Admitting: Physical Therapy

## 2024-05-08 DIAGNOSIS — M79605 Pain in left leg: Secondary | ICD-10-CM

## 2024-05-08 DIAGNOSIS — G8929 Other chronic pain: Secondary | ICD-10-CM | POA: Diagnosis not present

## 2024-05-08 DIAGNOSIS — M25562 Pain in left knee: Secondary | ICD-10-CM

## 2024-05-08 DIAGNOSIS — R2689 Other abnormalities of gait and mobility: Secondary | ICD-10-CM

## 2024-05-08 DIAGNOSIS — M25552 Pain in left hip: Secondary | ICD-10-CM | POA: Diagnosis not present

## 2024-05-08 DIAGNOSIS — M6281 Muscle weakness (generalized): Secondary | ICD-10-CM | POA: Diagnosis not present

## 2024-05-08 NOTE — Therapy (Signed)
 OUTPATIENT PHYSICAL THERAPY TREATMENT   Patient Name: Deborah Barton MRN: [id number] DOB:1946/07/05, 78 y.o., female Today's Date: 05/08/2024   END OF SESSION:  PT End of Session - 05/08/24 1321     Visit Number 5    Number of Visits 9    Date for PT Re-Evaluation 06/05/24    Authorization Type [employer] MCR    Authorization Time Period 04/10/2024 - 06/05/2024    Authorization - Visit Number 5    Authorization - Number of Visits 9    Progress Note Due on Visit 10    PT Start Time 1300    PT Stop Time 1340    PT Time Calculation (min) 40 min    Activity Tolerance Patient tolerated treatment well    Behavior During Therapy WFL for tasks assessed/performed                 Past Medical History:  Diagnosis Date   Acid reflux    hx of   Anemia    hx of-   Anxiety    PRN meds   Arthritis    RIGHT knee   Cataract    bilateral sx   Hypertension    situational - not taking med   Neuropathy    Past Surgical History:  Procedure Laterality Date   ABDOMINAL HYSTERECTOMY  1994   CHOLECYSTECTOMY  1975   Patient Active Problem List   Diagnosis Date Noted   GERD (gastroesophageal reflux disease) 04/09/2024   Pulsatile tinnitus, left ear 12/06/2023   Sensorineural hearing loss, bilateral 12/06/2023   Left shoulder pain 07/07/2022   Needle phobia 01/14/2021   Forgetfulness 06/20/2018   OA (osteoarthritis) of knee 01/05/2018   Left knee pain 10/11/2017   Routine general medical examination at a health care facility 11/04/2015   Essential hypertension 01/27/2015   Hereditary and idiopathic peripheral neuropathy 01/27/2015   Generalized anxiety disorder 01/27/2015    PCP: Adelia Homestead, MD  REFERRING PROVIDER: Syliva Even, MD  REFERRING DIAG: Left hip pain; Chronic pain of both knees; Lumbar radiculopathy  THERAPY DIAG:  Chronic pain of left knee  Pain in left hip  Pain in left leg  Other abnormalities of gait and mobility  Muscle weakness  (generalized)  Rationale for Evaluation and Treatment: Rehabilitation  ONSET DATE: Chronic, 3 years   SUBJECTIVE:  SUBJECTIVE STATEMENT: Patient reports she got shots in both knees and that has helped them and the swelling went down. She is still having some pain from the left hip to the knee.  Eval: Patient reports left knee pain that travels from the left hip down the thigh to the knee and lower leg. She does have some right knee pain as well but it is not as bad. She reports that if she sits for a while, when she goes to stand she has to stand still for a minutes before she can start walking because the left leg won't let her walk and it feels stiff. This has been going on for a couple years with muscle spasms in her legs when she would be stooped for a while, but since then the left knee has been giving her more trouble. She used to do exercises where she was walking, the bike, and some abdominal exercises so would like to know what she can do without making things worse. She does report difficulty lying on the left side. She has had injections in both knees and a recent epidural which did provide some  pain relief.  PERTINENT HISTORY: See PMH above  PAIN:  Are you having pain? Yes:  NPRS scale: 3/10 Pain location: Left knee and hip Pain description: Stiffness Aggravating factors: Sitting for long periods and then getting up to walk Relieving factors: Medication  PRECAUTIONS: None  PATIENT GOALS: Pain relief and reduce left leg stiffness with walking   OBJECTIVE:  Note: Objective measures were completed at Evaluation unless otherwise noted. DIAGNOSTIC FINDINGS:   Lumbar MRI 03/03/2024 IMPRESSION: 1. Short pedicles and degeneration causes spinal stenosis that is advanced at L4-5 and moderate at L3-4. 2. Up to moderate foraminal narrowing at L4-5 and L5-S1.  PATIENT SURVEYS:  PSFS: 6.4 Walking after sitting for a while: 5 Bending working in yard: 8 Exercise on left side:  7 Walking for a long period: 5 Wearing heels: 7  EDEMA:  Left knee edema noted compared to right  MUSCLE LENGTH: Limitations with bilateral hamstrings, left hip flexor/quad  POSTURE:   Patient exhibits decreased knee extension on left, weight shift to right  PALPATION: Tender to palpation peripatellar region, left lateral quad/ITB, left lateral gluteal region  LUMBAR ROM:   Active  A/PROM  eval  Flexion WFL  Extension 75%  Right lateral flexion   Left lateral flexion   Right rotation WFL  Left rotation WFL   (Blank rows = not tested)  LOWER EXTREMITY ROM:  Active ROM Right eval Left eval Left 04/19/2024  Hip flexion     Hip extension     Hip abduction     Hip adduction     Hip internal rotation     Hip external rotation     Knee flexion 130 120   Knee extension 0 5 lacking 5 lacking  Ankle dorsiflexion     Ankle plantarflexion     Ankle inversion     Ankle eversion      (Blank rows = not tested)  Bilateral hip ROM grossly WFL  LOWER EXTREMITY MMT:  MMT Right eval Left eval Left 04/25/2024  Hip flexion 4 4-   Hip extension 3+ 3-   Hip abduction 3+ 3- 3+  Hip adduction     Hip internal rotation     Hip external rotation     Knee flexion 4 4   Knee extension 4+ 4   Ankle dorsiflexion     Ankle plantarflexion     Ankle inversion     Ankle eversion      (Blank rows = not tested)  SPECIAL TESTS:  SLR positive on left  FUNCTIONAL TESTS:  5xSTS: 23 seconds  GAIT: Assistive device utilized: None Level of assistance: Complete Independence Comments: Antalgic on left                                                                                                                               TREATMENT [facility] Adult PT Treatment:  DATE: 05/08/2024 Recumbent bike L4 x 5 min to improve endurance and workload capacity STM to left quad, adductor, ITB, and gluteal region using theragun Modified thomas stretch 3  x 20 sec each SLR 2 x 10 each Bridge with ball squeeze 2 x 10 LAQ with 5# 3 x 10 Sit to stand x 10, holding 10# 2 x 10 Standing hip abduction with red at knees 2 x 10 each Standing heel raises 2 x 15  PATIENT EDUCATION:  Education details: HEP Person educated: Patient Education method: Programmer, multimedia, Demonstration, Tactile cues, Verbal cues Education comprehension: verbalized understanding, returned demonstration, verbal cues required, tactile cues required, and needs further education  HOME EXERCISE PROGRAM: Access Code: [email or web address]    ASSESSMENT: CLINICAL IMPRESSION: Patient tolerated therapy well with no adverse effects. Therapy focused primarily on progressing LE strengthening with good tolerance. She does report improvement in bilateral knee pain following injections. Continued with STM for the left hip and thigh to reduce muscle tension. She was able to progress weights with her LE strengthening without any increase in knee or hip pain and progressed more standing hip strengthening with good tolerance. No changes made to HEP this visit. Patient would benefit from continued skilled PT to progress her mobility and strength in order to reduce pain and maximize functional ability.   Eval: Patient is a 78 y.o. female who was seen today for physical therapy evaluation and treatment for chronic left leg pain that is primarily in the left knee. She does exhibit some left sided radicular symptoms. She exhibits limitations in her left knee motion and strength deficits of hip and knee greater on the the side. She also exhibit flexibility deficits greater on the left and tenderness of the left gluteal, lateral thigh, and knee region that is likely contributing to her pain and impacting her functional ability.    OBJECTIVE IMPAIRMENTS: Abnormal gait, decreased activity tolerance, decreased ROM, decreased strength, impaired flexibility, and pain.   ACTIVITY LIMITATIONS: lifting, bending, sitting,  standing, squatting, sleeping, stairs, and locomotion level  PARTICIPATION LIMITATIONS: cleaning, shopping, community activity, and yard work  PERSONAL FACTORS: Fitness, Past/current experiences, and Time since onset of injury/illness/exacerbation are also affecting patient's functional outcome.    GOALS: Goals reviewed with patient? Yes  SHORT TERM GOALS: Target date: 05/08/2024  Patient will be I with initial HEP in order to progress with therapy. Baseline: HEP provided at eval Goal status: INITIAL  2.  Patient will report left leg pain </= 4/10 in order to reduce functional limitations Baseline: 6/10 Goal status: INITIAL  3.  Patient will exhibit left knee extension to 0 deg in order to improve gait and mobility Baseline: lacking 5 deg Goal status: INITIAL  LONG TERM GOALS: Target date: 06/05/2024  Patient will be I with final HEP to maintain progress from PT. Baseline: HEP provided at eval Goal status: INITIAL  2.  Patient will report PSFS >/= 8 in order to indicate improvement in her functional ability Baseline: 6.4 Goal status: INITIAL  3.  Patient will demonstrate hip strength >/= 4-/5 MMT in order to improve her ability to initiate walking and for lifting tasks Baseline: see limitations above Goal status: INITIAL  4.  Patient will demonstrate knee strength 5/5 MMT in order to improve her walking and activity tolerance Baseline: see limitations above Goal status: INITIAL  5. Patient will demonstrate 5xSTS </= 12 seconds to indicate improved strength and mobility  Baseline: 23 seconds  Goal status: INITIAL   PLAN: PT FREQUENCY: 1x/week  PT  DURATION: 8 weeks  PLANNED INTERVENTIONS: 97164- PT Re-evaluation, 97110-Therapeutic exercises, 97530- Therapeutic activity, 97112- Neuromuscular re-education, 97535- Self Care, 16109- Manual therapy, [phone]- Gait training, Patient/Family education, Balance training, Taping, Dry Needling, Joint mobilization, Joint  manipulation, Spinal manipulation, Spinal mobilization, Cryotherapy, and Moist heat  PLAN FOR NEXT SESSION: Review HEP and progress PRN, manual/TPDN for left lateral hip and thigh region, manual for left knee mobility, address flexibility deficits of the left hip and thigh, progress core stabilization, hip and knee strengthening    Leah Primus, PT, DPT, LAT, ATC 05/08/24  1:45 PM Phone: [phone] Fax: [phone]

## 2024-05-09 ENCOUNTER — Ambulatory Visit: Payer: Self-pay | Admitting: Family Medicine

## 2024-05-09 NOTE — Progress Notes (Signed)
Right knee x-ray shows medium arthritis

## 2024-05-09 NOTE — Progress Notes (Signed)
Left knee x-ray shows medium arthritis.

## 2024-05-15 ENCOUNTER — Encounter: Admitting: Physical Therapy

## 2024-05-16 ENCOUNTER — Ambulatory Visit
Admission: RE | Admit: 2024-05-16 | Discharge: 2024-05-16 | Disposition: A | Discharge status: Home / Self Care | Source: Ambulatory Visit | Attending: Internal Medicine | Admitting: Internal Medicine

## 2024-05-16 DIAGNOSIS — Z1231 Encounter for screening mammogram for malignant neoplasm of breast: Secondary | ICD-10-CM

## 2024-05-17 NOTE — Therapy (Signed)
 OUTPATIENT PHYSICAL THERAPY TREATMENT   Patient Name: Deborah Barton MRN: [id number] DOB:09-27-1946, 78 y.o., female Today's Date: 05/18/2024   END OF SESSION:  PT End of Session - 05/18/24 1108     Visit Number 6    Number of Visits 9    Date for PT Re-Evaluation 06/05/24    Authorization Type [employer] MCR    Authorization Time Period 04/10/2024 - 06/05/2024    Authorization - Visit Number 6    Authorization - Number of Visits 9    Progress Note Due on Visit 10    PT Start Time 1102    PT Stop Time 1141    PT Time Calculation (min) 39 min    Activity Tolerance Patient tolerated treatment well    Behavior During Therapy WFL for tasks assessed/performed                  Past Medical History:  Diagnosis Date   Acid reflux    hx of   Anemia    hx of-   Anxiety    PRN meds   Arthritis    RIGHT knee   Cataract    bilateral sx   Hypertension    situational - not taking med   Neuropathy    Past Surgical History:  Procedure Laterality Date   ABDOMINAL HYSTERECTOMY  1994   CHOLECYSTECTOMY  1975   Patient Active Problem List   Diagnosis Date Noted   GERD (gastroesophageal reflux disease) 04/09/2024   Pulsatile tinnitus, left ear 12/06/2023   Sensorineural hearing loss, bilateral 12/06/2023   Left shoulder pain 07/07/2022   Needle phobia 01/14/2021   Forgetfulness 06/20/2018   OA (osteoarthritis) of knee 01/05/2018   Left knee pain 10/11/2017   Routine general medical examination at a health care facility 11/04/2015   Essential hypertension 01/27/2015   Hereditary and idiopathic peripheral neuropathy 01/27/2015   Generalized anxiety disorder 01/27/2015    PCP: Adelia Homestead, MD  REFERRING PROVIDER: Syliva Even, MD  REFERRING DIAG: Left hip pain; Chronic pain of both knees; Lumbar radiculopathy  THERAPY DIAG:  Chronic pain of left knee  Pain in left hip  Other abnormalities of gait and mobility  Muscle weakness  (generalized)  Rationale for Evaluation and Treatment: Rehabilitation  ONSET DATE: Chronic, 3 years   SUBJECTIVE:  SUBJECTIVE STATEMENT: Patient reports her legs didn't hurt at all on her trip and she has been doing really well.   Eval: Patient reports left knee pain that travels from the left hip down the thigh to the knee and lower leg. She does have some right knee pain as well but it is not as bad. She reports that if she sits for a while, when she goes to stand she has to stand still for a minutes before she can start walking because the left leg won't let her walk and it feels stiff. This has been going on for a couple years with muscle spasms in her legs when she would be stooped for a while, but since then the left knee has been giving her more trouble. She used to do exercises where she was walking, the bike, and some abdominal exercises so would like to know what she can do without making things worse. She does report difficulty lying on the left side. She has had injections in both knees and a recent epidural which did provide some pain relief.  PERTINENT HISTORY: See PMH above  PAIN:  Are you having pain? Yes:  NPRS scale: 1/10 Pain location: Left knee and hip Pain description: Stiffness Aggravating factors: Sitting for long periods and then getting up to walk Relieving factors: Medication  PRECAUTIONS: None  PATIENT GOALS: Pain relief and reduce left leg stiffness with walking   OBJECTIVE:  Note: Objective measures were completed at Evaluation unless otherwise noted. DIAGNOSTIC FINDINGS:   Lumbar MRI 03/03/2024 IMPRESSION: 1. Short pedicles and degeneration causes spinal stenosis that is advanced at L4-5 and moderate at L3-4. 2. Up to moderate foraminal narrowing at L4-5 and L5-S1.  PATIENT SURVEYS:  PSFS: 6.4 Walking after sitting for a while: 5 Bending working in yard: 8 Exercise on left side: 7 Walking for a long period: 5 Wearing heels: 7  EDEMA:  Left  knee edema noted compared to right  MUSCLE LENGTH: Limitations with bilateral hamstrings, left hip flexor/quad  POSTURE:   Patient exhibits decreased knee extension on left, weight shift to right  PALPATION: Tender to palpation peripatellar region, left lateral quad/ITB, left lateral gluteal region  LUMBAR ROM:   Active  A/PROM  eval  Flexion WFL  Extension 75%  Right lateral flexion   Left lateral flexion   Right rotation WFL  Left rotation WFL   (Blank rows = not tested)  LOWER EXTREMITY ROM:  Active ROM Right eval Left eval Left 04/19/2024 Left 05/18/2024  Hip flexion      Hip extension      Hip abduction      Hip adduction      Hip internal rotation      Hip external rotation      Knee flexion 130 120    Knee extension 0 5 lacking 5 lacking 0  Ankle dorsiflexion      Ankle plantarflexion      Ankle inversion      Ankle eversion       (Blank rows = not tested)  Bilateral hip ROM grossly [facility]  LOWER EXTREMITY MMT:  MMT Right eval Left eval Left 04/25/2024 Rt / Lt 05/18/2024  Hip flexion 4 4-    Hip extension 3+ 3-    Hip abduction 3+ 3- 3+   Hip adduction      Hip internal rotation      Hip external rotation      Knee flexion 4 4    Knee extension 4+ 4  5 / 5  Ankle dorsiflexion      Ankle plantarflexion      Ankle inversion      Ankle eversion       (Blank rows = not tested)  SPECIAL TESTS:  SLR positive on left  FUNCTIONAL TESTS:  5xSTS: 23 seconds  GAIT: Assistive device utilized: None Level of assistance: Complete Independence Comments: Antalgic on left                                                                                                                               TREATMENT [facility] Adult  PT Treatment:                                                DATE: 05/18/2024 Recumbent bike L4 x 5 min to improve endurance and workload capacity SLR 2 x 10 each Bridge with ball squeeze 2 x 10 Side clamshell with red 2 x 10 each Sit to  stand holding 10# 3 x 10 LAQ with 5# 3 x 10 each Standing heel raises 2 x 15 Tandem stance 3 x 30 sec each  PATIENT EDUCATION:  Education details: HEP Person educated: Patient Education method: Programmer, multimedia, Demonstration, Tactile cues, Verbal cues Education comprehension: verbalized understanding, returned demonstration, verbal cues required, tactile cues required, and needs further education  HOME EXERCISE PROGRAM: Access Code: [email or web address]    ASSESSMENT: CLINICAL IMPRESSION: Patient tolerated therapy well with no adverse effects. Therapy focused on progressing LE strengthening with good tolerance. She does exhibit improvement in her knee strength and left knee extension motion this visit, and is reporting overall improvement in her leg pain. Performed balance training with patient in tandem stance with good tolerance and occasional need of UE for support. Updated her HEP to further progress LE strengthening and incorporate some balance training for home. Patient would benefit from continued skilled PT to progress her mobility and strength in order to reduce pain and maximize functional ability.   Eval: Patient is a 78 y.o. female who was seen today for physical therapy evaluation and treatment for chronic left leg pain that is primarily in the left knee. She does exhibit some left sided radicular symptoms. She exhibits limitations in her left knee motion and strength deficits of hip and knee greater on the the side. She also exhibit flexibility deficits greater on the left and tenderness of the left gluteal, lateral thigh, and knee region that is likely contributing to her pain and impacting her functional ability.    OBJECTIVE IMPAIRMENTS: Abnormal gait, decreased activity tolerance, decreased ROM, decreased strength, impaired flexibility, and pain.   ACTIVITY LIMITATIONS: lifting, bending, sitting, standing, squatting, sleeping, stairs, and locomotion level  PARTICIPATION LIMITATIONS:  cleaning, shopping, community activity, and yard work  PERSONAL FACTORS: Fitness, Past/current experiences, and Time since onset of injury/illness/exacerbation are also affecting patient's functional outcome.    GOALS: Goals reviewed with patient? Yes  SHORT TERM GOALS: Target date: 05/08/2024  Patient will be I with initial HEP in order to progress with therapy. Baseline: HEP provided at eval 05/18/2024: independent Goal status: MET  2.  Patient will report left leg pain </= 4/10 in order to reduce functional limitations Baseline: 6/10 05/18/2024: patient reports improvement in pain level Goal status: MET  3.  Patient will exhibit left knee extension to 0 deg in order to improve gait and mobility Baseline: lacking 5 deg 05/18/2024: 0 deg Goal status: MET  LONG TERM GOALS: Target date: 06/05/2024  Patient will be I with final HEP to maintain progress from PT. Baseline: HEP provided at eval Goal status: INITIAL  2.  Patient will report PSFS >/= 8 in order to indicate improvement in her functional ability Baseline: 6.4 Goal status: INITIAL  3.  Patient will demonstrate hip strength >/= 4-/5 MMT in order to improve her ability to initiate walking and for lifting tasks Baseline: see limitations above Goal status: INITIAL  4.  Patient will demonstrate knee strength 5/5 MMT in order to  improve her walking and activity tolerance Baseline: see limitations above Goal status: INITIAL  5. Patient will demonstrate 5xSTS </= 12 seconds to indicate improved strength and mobility  Baseline: 23 seconds  Goal status: INITIAL   PLAN: PT FREQUENCY: 1x/week  PT DURATION: 8 weeks  PLANNED INTERVENTIONS: 97164- PT Re-evaluation, 97110-Therapeutic exercises, 97530- Therapeutic activity, 97112- Neuromuscular re-education, 97535- Self Care, 16109- Manual therapy, [phone]- Gait training, Patient/Family education, Balance training, Taping, Dry Needling, Joint mobilization, Joint manipulation,  Spinal manipulation, Spinal mobilization, Cryotherapy, and Moist heat  PLAN FOR NEXT SESSION: Review HEP and progress PRN, manual/TPDN for left lateral hip and thigh region, manual for left knee mobility, address flexibility deficits of the left hip and thigh, progress core stabilization, hip and knee strengthening    Leah Primus, PT, DPT, LAT, ATC 05/18/24  11:44 AM Phone: [phone] Fax: [phone]

## 2024-05-18 ENCOUNTER — Other Ambulatory Visit: Payer: Self-pay

## 2024-05-18 ENCOUNTER — Ambulatory Visit: Admitting: Physical Therapy

## 2024-05-18 ENCOUNTER — Encounter: Payer: Self-pay | Admitting: Physical Therapy

## 2024-05-18 DIAGNOSIS — M25552 Pain in left hip: Secondary | ICD-10-CM | POA: Diagnosis not present

## 2024-05-18 DIAGNOSIS — R2689 Other abnormalities of gait and mobility: Secondary | ICD-10-CM | POA: Diagnosis not present

## 2024-05-18 DIAGNOSIS — M25562 Pain in left knee: Secondary | ICD-10-CM

## 2024-05-18 DIAGNOSIS — M6281 Muscle weakness (generalized): Secondary | ICD-10-CM | POA: Diagnosis not present

## 2024-05-18 DIAGNOSIS — G8929 Other chronic pain: Secondary | ICD-10-CM

## 2024-05-18 NOTE — Patient Instructions (Signed)
 Access Code: [email or web address] URL: [email or web address] Date: 05/18/2024 Prepared by: Leah Primus  Exercises - Modified Becki Bouton  - 1 x daily - 2 reps - 60 seconds hold - Active Straight Leg Raise with Quad Set  - 1 x daily - 2 sets - 10 reps - Clam with Resistance  - 1 x daily - 2 sets - 10 reps - Supine Bridge with Mini Swiss Ball Between Knees  - 1 x daily - 2 sets - 10 reps - Sit to Stand  - 1 x daily - 3 sets - 10 reps - Heel Raises with Counter Support  - 1 x daily - 2 sets - 15 reps - Standing Tandem Balance with Counter Support  - 1 x daily - 3 reps - 30 seconds hold

## 2024-05-22 ENCOUNTER — Encounter: Payer: Self-pay | Admitting: Physical Therapy

## 2024-05-22 ENCOUNTER — Ambulatory Visit: Admitting: Physical Therapy

## 2024-05-22 ENCOUNTER — Encounter: Admitting: Physical Therapy

## 2024-05-22 ENCOUNTER — Ambulatory Visit: Payer: Self-pay | Admitting: Internal Medicine

## 2024-05-22 ENCOUNTER — Other Ambulatory Visit: Payer: Self-pay

## 2024-05-22 DIAGNOSIS — R2689 Other abnormalities of gait and mobility: Secondary | ICD-10-CM | POA: Diagnosis not present

## 2024-05-22 DIAGNOSIS — M25552 Pain in left hip: Secondary | ICD-10-CM | POA: Diagnosis not present

## 2024-05-22 DIAGNOSIS — M6281 Muscle weakness (generalized): Secondary | ICD-10-CM

## 2024-05-22 DIAGNOSIS — M25562 Pain in left knee: Secondary | ICD-10-CM

## 2024-05-22 DIAGNOSIS — G8929 Other chronic pain: Secondary | ICD-10-CM | POA: Diagnosis not present

## 2024-05-22 NOTE — Therapy (Signed)
 OUTPATIENT PHYSICAL THERAPY TREATMENT   Patient Name: Deborah Barton MRN: [id number] DOB:1946-06-22, 78 y.o., female Today's Date: 05/22/2024   END OF SESSION:  PT End of Session - 05/22/24 1531     Visit Number 7    Number of Visits 9    Date for PT Re-Evaluation 06/05/24    Authorization Type [employer] MCR    Authorization Time Period 04/10/2024 - 06/05/2024    Authorization - Visit Number 7    Authorization - Number of Visits 9    Progress Note Due on Visit 10    PT Start Time 1516    PT Stop Time 1600    PT Time Calculation (min) 44 min    Activity Tolerance Patient tolerated treatment well    Behavior During Therapy WFL for tasks assessed/performed                   Past Medical History:  Diagnosis Date   Acid reflux    hx of   Anemia    hx of-   Anxiety    PRN meds   Arthritis    RIGHT knee   Cataract    bilateral sx   Hypertension    situational - not taking med   Neuropathy    Past Surgical History:  Procedure Laterality Date   ABDOMINAL HYSTERECTOMY  1994   CHOLECYSTECTOMY  1975   Patient Active Problem List   Diagnosis Date Noted   GERD (gastroesophageal reflux disease) 04/09/2024   Pulsatile tinnitus, left ear 12/06/2023   Sensorineural hearing loss, bilateral 12/06/2023   Left shoulder pain 07/07/2022   Needle phobia 01/14/2021   Forgetfulness 06/20/2018   OA (osteoarthritis) of knee 01/05/2018   Left knee pain 10/11/2017   Routine general medical examination at a health care facility 11/04/2015   Essential hypertension 01/27/2015   Hereditary and idiopathic peripheral neuropathy 01/27/2015   Generalized anxiety disorder 01/27/2015    PCP: Adelia Homestead, MD  REFERRING PROVIDER: Syliva Even, MD  REFERRING DIAG: Left hip pain; Chronic pain of both knees; Lumbar radiculopathy  THERAPY DIAG:  Chronic pain of left knee  Pain in left hip  Other abnormalities of gait and mobility  Muscle weakness  (generalized)  Rationale for Evaluation and Treatment: Rehabilitation  ONSET DATE: Chronic, 3 years   SUBJECTIVE:  SUBJECTIVE STATEMENT: Patient reports she woke up a little stiff this morning.  Eval: Patient reports left knee pain that travels from the left hip down the thigh to the knee and lower leg. She does have some right knee pain as well but it is not as bad. She reports that if she sits for a while, when she goes to stand she has to stand still for a minutes before she can start walking because the left leg won't let her walk and it feels stiff. This has been going on for a couple years with muscle spasms in her legs when she would be stooped for a while, but since then the left knee has been giving her more trouble. She used to do exercises where she was walking, the bike, and some abdominal exercises so would like to know what she can do without making things worse. She does report difficulty lying on the left side. She has had injections in both knees and a recent epidural which did provide some pain relief.  PERTINENT HISTORY: See PMH above  PAIN:  Are you having pain? Yes:  NPRS scale: 1/10 Pain location: Left knee  and hip Pain description: Stiffness Aggravating factors: Sitting for long periods and then getting up to walk Relieving factors: Medication  PRECAUTIONS: None  PATIENT GOALS: Pain relief and reduce left leg stiffness with walking   OBJECTIVE:  Note: Objective measures were completed at Evaluation unless otherwise noted. DIAGNOSTIC FINDINGS:   Lumbar MRI 03/03/2024 IMPRESSION: 1. Short pedicles and degeneration causes spinal stenosis that is advanced at L4-5 and moderate at L3-4. 2. Up to moderate foraminal narrowing at L4-5 and L5-S1.  PATIENT SURVEYS:  PSFS: 6.4 Walking after sitting for a while: 5 Bending working in yard: 8 Exercise on left side: 7 Walking for a long period: 5 Wearing heels: 7  EDEMA:  Left knee edema noted compared to  right  MUSCLE LENGTH: Limitations with bilateral hamstrings, left hip flexor/quad  POSTURE:   Patient exhibits decreased knee extension on left, weight shift to right  PALPATION: Tender to palpation peripatellar region, left lateral quad/ITB, left lateral gluteal region  LUMBAR ROM:   Active  A/PROM  eval  Flexion WFL  Extension 75%  Right lateral flexion   Left lateral flexion   Right rotation WFL  Left rotation WFL   (Blank rows = not tested)  LOWER EXTREMITY ROM:  Active ROM Right eval Left eval Left 04/19/2024 Left 05/18/2024  Hip flexion      Hip extension      Hip abduction      Hip adduction      Hip internal rotation      Hip external rotation      Knee flexion 130 120    Knee extension 0 5 lacking 5 lacking 0  Ankle dorsiflexion      Ankle plantarflexion      Ankle inversion      Ankle eversion       (Blank rows = not tested)  Bilateral hip ROM grossly [facility]  LOWER EXTREMITY MMT:  MMT Right eval Left eval Left 04/25/2024 Rt / Lt 05/18/2024  Hip flexion 4 4-    Hip extension 3+ 3-    Hip abduction 3+ 3- 3+   Hip adduction      Hip internal rotation      Hip external rotation      Knee flexion 4 4    Knee extension 4+ 4  5 / 5  Ankle dorsiflexion      Ankle plantarflexion      Ankle inversion      Ankle eversion       (Blank rows = not tested)  SPECIAL TESTS:  SLR positive on left  FUNCTIONAL TESTS:  5xSTS: 23 seconds  GAIT: Assistive device utilized: None Level of assistance: Complete Independence Comments: Antalgic on left                                                                                                                               TREATMENT [facility] Adult PT Treatment:  DATE: 05/22/2024 Recumbent bike L4 x 6 min to improve endurance and workload capacity SLR 2 x 10 each Bridge 2 x 10 Side clamshell with blue 3 x 10 each Sit to stand holding 15# 3 x 10 LAQ with 5# 3 x 10  each Standing heel raises 2 x 15 Standing hip abduction and extension with red at knees 2 x 10 each Deadlift with 20# 3 x 10  PATIENT EDUCATION:  Education details: HEP Person educated: Patient Education method: Programmer, multimedia, Demonstration, Tactile cues, Verbal cues Education comprehension: verbalized understanding, returned demonstration, verbal cues required, tactile cues required, and needs further education  HOME EXERCISE PROGRAM: Access Code: [email or web address]    ASSESSMENT: CLINICAL IMPRESSION: Patient tolerated therapy well with no adverse effects. Therapy focused primarily on progressing her strengthening with good tolerance. She was able to progress with banded resistance for her hip strengthening and increased weights with her squatting exercise. Incorporated deadlift with good technique and no increase in pain reported this visit. No changes made to her HEP this visit. Patient would benefit from continued skilled PT to progress her mobility and strength in order to reduce pain and maximize functional ability.   Eval: Patient is a 78 y.o. female who was seen today for physical therapy evaluation and treatment for chronic left leg pain that is primarily in the left knee. She does exhibit some left sided radicular symptoms. She exhibits limitations in her left knee motion and strength deficits of hip and knee greater on the the side. She also exhibit flexibility deficits greater on the left and tenderness of the left gluteal, lateral thigh, and knee region that is likely contributing to her pain and impacting her functional ability.    OBJECTIVE IMPAIRMENTS: Abnormal gait, decreased activity tolerance, decreased ROM, decreased strength, impaired flexibility, and pain.   ACTIVITY LIMITATIONS: lifting, bending, sitting, standing, squatting, sleeping, stairs, and locomotion level  PARTICIPATION LIMITATIONS: cleaning, shopping, community activity, and yard work  PERSONAL FACTORS: Fitness,  Past/current experiences, and Time since onset of injury/illness/exacerbation are also affecting patient's functional outcome.    GOALS: Goals reviewed with patient? Yes  SHORT TERM GOALS: Target date: 05/08/2024  Patient will be I with initial HEP in order to progress with therapy. Baseline: HEP provided at eval 05/18/2024: independent Goal status: MET  2.  Patient will report left leg pain </= 4/10 in order to reduce functional limitations Baseline: 6/10 05/18/2024: patient reports improvement in pain level Goal status: MET  3.  Patient will exhibit left knee extension to 0 deg in order to improve gait and mobility Baseline: lacking 5 deg 05/18/2024: 0 deg Goal status: MET  LONG TERM GOALS: Target date: 06/05/2024  Patient will be I with final HEP to maintain progress from PT. Baseline: HEP provided at eval Goal status: INITIAL  2.  Patient will report PSFS >/= 8 in order to indicate improvement in her functional ability Baseline: 6.4 Goal status: INITIAL  3.  Patient will demonstrate hip strength >/= 4-/5 MMT in order to improve her ability to initiate walking and for lifting tasks Baseline: see limitations above Goal status: INITIAL  4.  Patient will demonstrate knee strength 5/5 MMT in order to improve her walking and activity tolerance Baseline: see limitations above Goal status: INITIAL  5. Patient will demonstrate 5xSTS </= 12 seconds to indicate improved strength and mobility  Baseline: 23 seconds  Goal status: INITIAL   PLAN: PT FREQUENCY: 1x/week  PT DURATION: 8 weeks  PLANNED INTERVENTIONS: 97164- PT Re-evaluation, 97110-Therapeutic exercises, 97530-  Therapeutic activity, [id number]- Neuromuscular re-education, [phone]- Self Care, 19147- Manual therapy, [phone]- Gait training, Patient/Family education, Balance training, Taping, Dry Needling, Joint mobilization, Joint manipulation, Spinal manipulation, Spinal mobilization, Cryotherapy, and Moist heat  PLAN FOR NEXT  SESSION: Review HEP and progress PRN, manual/TPDN for left lateral hip and thigh region, manual for left knee mobility, address flexibility deficits of the left hip and thigh, progress core stabilization, hip and knee strengthening    Leah Primus, PT, DPT, LAT, ATC 05/22/24  4:01 PM Phone: [phone] Fax: [phone]

## 2024-05-23 ENCOUNTER — Ambulatory Visit (INDEPENDENT_AMBULATORY_CARE_PROVIDER_SITE_OTHER): Admitting: Nurse Practitioner

## 2024-05-23 ENCOUNTER — Encounter: Payer: Self-pay | Admitting: Nurse Practitioner

## 2024-05-23 ENCOUNTER — Other Ambulatory Visit (HOSPITAL_COMMUNITY)
Admission: RE | Admit: 2024-05-23 | Discharge: 2024-05-23 | Disposition: A | Discharge status: Home / Self Care | Source: Ambulatory Visit | Attending: Nurse Practitioner | Admitting: Nurse Practitioner

## 2024-05-23 VITALS — BP 132/82 | HR 84 | Ht 61.0 in | Wt 127.0 lb

## 2024-05-23 DIAGNOSIS — N760 Acute vaginitis: Secondary | ICD-10-CM

## 2024-05-23 DIAGNOSIS — Z9189 Other specified personal risk factors, not elsewhere classified: Secondary | ICD-10-CM | POA: Diagnosis not present

## 2024-05-23 DIAGNOSIS — B9689 Other specified bacterial agents as the cause of diseases classified elsewhere: Secondary | ICD-10-CM

## 2024-05-23 DIAGNOSIS — Z780 Asymptomatic menopausal state: Secondary | ICD-10-CM | POA: Diagnosis not present

## 2024-05-23 DIAGNOSIS — Z01419 Encounter for gynecological examination (general) (routine) without abnormal findings: Secondary | ICD-10-CM | POA: Diagnosis present

## 2024-05-23 DIAGNOSIS — N898 Other specified noninflammatory disorders of vagina: Secondary | ICD-10-CM

## 2024-05-23 DIAGNOSIS — M8589 Other specified disorders of bone density and structure, multiple sites: Secondary | ICD-10-CM | POA: Diagnosis not present

## 2024-05-23 DIAGNOSIS — R87610 Atypical squamous cells of undetermined significance on cytologic smear of cervix (ASC-US): Secondary | ICD-10-CM

## 2024-05-23 DIAGNOSIS — Z124 Encounter for screening for malignant neoplasm of cervix: Secondary | ICD-10-CM

## 2024-05-23 DIAGNOSIS — Z1151 Encounter for screening for human papillomavirus (HPV): Secondary | ICD-10-CM | POA: Insufficient documentation

## 2024-05-23 LAB — HM MAMMOGRAPHY

## 2024-05-23 LAB — WET PREP FOR TRICH, YEAST, CLUE

## 2024-05-23 MED ORDER — METRONIDAZOLE 0.75 % VA GEL: 1.0000 | Freq: Every day | VAGINAL | 0 refills | Status: AC

## 2024-05-23 NOTE — Progress Notes (Signed)
 Deborah Barton 05-18-1946 [id number]   History:  78 y.o. G2P2 presents for breast and pelvic exam. Postmenopausal - no HRT. S/P 1994 TAH for fibroids and endometriosis. 2018 ASCUS negative HPV, otherwise normal history.   Gynecologic History No LMP recorded. Patient has had a hysterectomy.   Contraception: status post hysterectomy Sexually active:Yes  Health maintenance Last Pap: 11/12/2019. Results were: Normal Last mammogram: 05/16/2024. Results were: Normal Last colonoscopy: 01/30/2021. Results were: Tubular adenomas, 5-year recall Last Dexa: 01/11/2022. Results were: T-score -1.9, FRAX 5.7% / 1.3%  Past medical history, past surgical history, family history and social history were all reviewed and documented in the EPIC chart. Boyfriend. 26 year old son, has 57 yo son. 90 yo son. MGM and maternal aunt with history of ovarian cancer.   ROS:  A ROS was performed and pertinent positives and negatives are included.  Exam:  Vitals:   05/23/24 1040  BP: 132/82  Pulse: 84  SpO2: 98%  Weight: 127 lb (57.6 kg)  Height: 5\' 1"  (1.549 m)     Body mass index is 24 kg/m.  General appearance:  Normal Thyroid :  Symmetrical, normal in size, without palpable masses or nodularity. Respiratory  Auscultation:  Clear without wheezing or rhonchi Cardiovascular  Auscultation:  Regular rate, without rubs, murmurs or gallops  Edema/varicosities:  Not grossly evident Abdominal  Soft,nontender, without masses, guarding or rebound.  Liver/spleen:  No organomegaly noted  Hernia:  None appreciated  Skin  Inspection:  Grossly normal   Breasts: Examined lying and sitting.   Right: Without masses, retractions, discharge or axillary adenopathy.   Left: Without masses, retractions, discharge or axillary adenopathy. Pelvic: External genitalia:  no lesions              Urethra:  normal appearing urethra with no masses, tenderness or lesions              Bartholins and Skenes: normal                  Vagina: normal appearing vagina with normal color and discharge, no lesions. Atrophic changes              Cervix: absent Bimanual Exam:  Uterus:  absent              Adnexa: no mass, fullness, tenderness              Rectovaginal: Deferred              Anus:  normal, no lesions  Doria Garden, NP student present as chaperone.   Wet prep + clue cells (+ odor)  Assessment/Plan:  78 y.o. G2P2 for breast and pelvic exam.   Encounter for breast and pelvic examination - Plan: Cytology - PAP( [facility]) - Education provided on SBEs, importance of preventative screenings, current guidelines, high calcium diet, regular exercise, and multivitamin daily. Labs with PCP.   Postmenopausal - no HRT, no bleeding.   Osteopenia, unspecified location - 12/2021 T-score -1.9 without elevated FRAX. DXAs at PCP office. Plans to call and schedule. Continue Vitamin D  + Calcium and regular exercise.   Cervical cancer screening - Plan: Cytology - PAP( [facility]). 2020 ASCUS neg HPV, normal pap history otherwise. Pap today per guidelines.   Vaginal discharge - Plan: WET PREP FOR TRICH, YEAST, CLUE. + clue cells.  Bacterial vaginosis - Plan: metroNIDAZOLE  (METROGEL ) 0.75 % vaginal gel nightly x 5 nights.   Screening for breast cancer - Normal mammogram history. Continue annual  screenings. Normal breast exam today.   Screening for colon cancer - 2022 colonoscopy - tubular adenomas. Will repeat at 5-year interval per GI recommendation.   Return in about 2 years (around 05/23/2026) for B&P.      Andee Bamberger [facility], 12:01 PM 05/23/2024

## 2024-05-28 LAB — CYTOLOGY - PAP
Comment: NEGATIVE
Diagnosis: NEGATIVE
High risk HPV: NEGATIVE

## 2024-05-29 ENCOUNTER — Ambulatory Visit: Admitting: Physical Therapy

## 2024-05-29 ENCOUNTER — Other Ambulatory Visit: Payer: Self-pay

## 2024-05-29 ENCOUNTER — Encounter: Payer: Self-pay | Admitting: Physical Therapy

## 2024-05-29 ENCOUNTER — Ambulatory Visit: Payer: Self-pay | Admitting: Nurse Practitioner

## 2024-05-29 DIAGNOSIS — M6281 Muscle weakness (generalized): Secondary | ICD-10-CM | POA: Diagnosis not present

## 2024-05-29 DIAGNOSIS — R2689 Other abnormalities of gait and mobility: Secondary | ICD-10-CM | POA: Diagnosis not present

## 2024-05-29 DIAGNOSIS — M25562 Pain in left knee: Secondary | ICD-10-CM | POA: Diagnosis not present

## 2024-05-29 DIAGNOSIS — M25552 Pain in left hip: Secondary | ICD-10-CM

## 2024-05-29 DIAGNOSIS — G8929 Other chronic pain: Secondary | ICD-10-CM | POA: Diagnosis not present

## 2024-05-29 NOTE — Therapy (Signed)
 OUTPATIENT PHYSICAL THERAPY TREATMENT   Patient Name: Deborah Barton MRN: [id number] DOB:[birth year], 78 y.o., female Today's Date: 05/29/2024   END OF SESSION:  PT End of Session - 05/29/24 1305     Visit Number 8    Number of Visits 14    Date for PT Re-Evaluation 07/10/24    Authorization Type [employer] MCR    Authorization Time Period 04/10/2024 - 06/05/2024    Authorization - Visit Number 8    Authorization - Number of Visits 9    Progress Note Due on Visit 10    PT Start Time 1300    PT Stop Time 1340    PT Time Calculation (min) 40 min    Activity Tolerance Patient tolerated treatment well    Behavior During Therapy WFL for tasks assessed/performed                    Past Medical History:  Diagnosis Date   Acid reflux    hx of   Anemia    hx of-   Anxiety    PRN meds   Arthritis    RIGHT knee   Cataract    bilateral sx   Hypertension    situational - not taking med   Neuropathy    Past Surgical History:  Procedure Laterality Date   ABDOMINAL HYSTERECTOMY  1994   CHOLECYSTECTOMY  1975   Patient Active Problem List   Diagnosis Date Noted   GERD (gastroesophageal reflux disease) 04/09/2024   Pulsatile tinnitus, left ear 12/06/2023   Sensorineural hearing loss, bilateral 12/06/2023   Left shoulder pain 07/07/2022   Needle phobia 01/14/2021   Forgetfulness 06/20/2018   OA (osteoarthritis) of knee 01/05/2018   Left knee pain 10/11/2017   Routine general medical examination at a health care facility 11/04/2015   Essential hypertension 01/27/2015   Hereditary and idiopathic peripheral neuropathy 01/27/2015   Generalized anxiety disorder 01/27/2015    PCP: Adelia Homestead, MD  REFERRING PROVIDER: Syliva Even, MD  REFERRING DIAG: Left hip pain; Chronic pain of both knees; Lumbar radiculopathy  THERAPY DIAG:  Chronic pain of left knee  Pain in left hip  Other abnormalities of gait and mobility  Muscle weakness  (generalized)  Rationale for Evaluation and Treatment: Rehabilitation  ONSET DATE: Chronic, 3 years   SUBJECTIVE:  SUBJECTIVE STATEMENT: Patient reports she is doing well, denies any pain at the moment.   Eval: Patient reports left knee pain that travels from the left hip down the thigh to the knee and lower leg. She does have some right knee pain as well but it is not as bad. She reports that if she sits for a while, when she goes to stand she has to stand still for a minutes before she can start walking because the left leg won't let her walk and it feels stiff. This has been going on for a couple years with muscle spasms in her legs when she would be stooped for a while, but since then the left knee has been giving her more trouble. She used to do exercises where she was walking, the bike, and some abdominal exercises so would like to know what she can do without making things worse. She does report difficulty lying on the left side. She has had injections in both knees and a recent epidural which did provide some pain relief.  PERTINENT HISTORY: See PMH above  PAIN:  Are you having pain? Yes:  NPRS scale: 0/10  Pain location: Left knee and hip Pain description: Stiffness Aggravating factors: Sitting for long periods and then getting up to walk Relieving factors: Medication  PRECAUTIONS: None  PATIENT GOALS: Pain relief and reduce left leg stiffness with walking   OBJECTIVE:  Note: Objective measures were completed at Evaluation unless otherwise noted. PATIENT SURVEYS:  PSFS: 6.4 Walking after sitting for a while: 5 Bending working in yard: 8 Exercise on left side: 7 Walking for a long period: 5 Wearing heels: 7  05/29/2024: PSFS: 7.8 Walking after sitting for a while: 7 Bending working in yard: 9 Exercise on left side: 9 Walking for a long period: 7 Wearing heels: 7  EDEMA:  Left knee edema noted compared to right  MUSCLE LENGTH: Limitations with bilateral  hamstrings, left hip flexor/quad  POSTURE:   Patient exhibits decreased knee extension on left, weight shift to right  PALPATION: Tender to palpation peripatellar region, left lateral quad/ITB, left lateral gluteal region  LUMBAR ROM:   Active  A/PROM  eval  Flexion WFL  Extension 75%  Right lateral flexion   Left lateral flexion   Right rotation WFL  Left rotation WFL   (Blank rows = not tested)  LOWER EXTREMITY ROM:  Active ROM Right eval Left eval Left 04/19/2024 Left 05/18/2024  Hip flexion      Hip extension      Hip abduction      Hip adduction      Hip internal rotation      Hip external rotation      Knee flexion 130 120    Knee extension 0 5 lacking 5 lacking 0  Ankle dorsiflexion      Ankle plantarflexion      Ankle inversion      Ankle eversion       (Blank rows = not tested)  Bilateral hip ROM grossly [facility]  LOWER EXTREMITY MMT:  MMT Right eval Left eval Left 04/25/2024 Rt / Lt 05/18/2024 Rt / Lt 05/29/2024  Hip flexion 4 4-   4 / 4  Hip extension 3+ 3-   4- / 4-  Hip abduction 3+ 3- 3+  4- / 4-  Hip adduction       Hip internal rotation       Hip external rotation       Knee flexion 4 4   5  / 5  Knee extension 4+ 4  5 / 5 5 / 5  Ankle dorsiflexion       Ankle plantarflexion       Ankle inversion       Ankle eversion        (Blank rows = not tested)  SPECIAL TESTS:  SLR positive on left  FUNCTIONAL TESTS:  5xSTS: 23 seconds  05/29/2024: 15 seconds  GAIT: Assistive device utilized: None Level of assistance: Complete Independence Comments: Antalgic on left  TREATMENT [facility] Adult PT Treatment:                                                DATE: 05/29/2024 Recumbent bike L4 x 5 min to improve endurance and workload capacity SLR 2 x 10 each Bridge 2 x 10 Sidelying hip abduction 2 x 15 LAQ with 5# 3 x 10  each Standing hip abduction and extension with 5# 2 x 10 each Deadlift with 25# 3 x 10 Standing heel raise 2 x 15 Tandem stance 3 x 30 sec each  PATIENT EDUCATION:  Education details: POC extension, HEP update Person educated: Patient Education method: Explanation, Demonstration, Tactile cues, Verbal cues, Handout Education comprehension: verbalized understanding, returned demonstration, verbal cues required, tactile cues required, and needs further education  HOME EXERCISE PROGRAM: Access Code: [email or web address]    ASSESSMENT: CLINICAL IMPRESSION: Patient tolerated therapy well with no adverse effects. She has made great progress in therapy, demonstrating improvement in her strength and functional ability. She does still report limitations with certain activities that affects her functional level and remains limited with her mobility, but is improving and reporting less pain. Therapy continues to focus on progressing her LE strengthening to improving activity tolerance. Updated her HEP to progress her hip and knee strengthening with ankle weights at home. Patient would benefit from continued skilled PT to progress her mobility and strength in order to reduce pain and maximize functional ability.   Eval: Patient is a 78 y.o. female who was seen today for physical therapy evaluation and treatment for chronic left leg pain that is primarily in the left knee. She does exhibit some left sided radicular symptoms. She exhibits limitations in her left knee motion and strength deficits of hip and knee greater on the the side. She also exhibit flexibility deficits greater on the left and tenderness of the left gluteal, lateral thigh, and knee region that is likely contributing to her pain and impacting her functional ability.    OBJECTIVE IMPAIRMENTS: Abnormal gait, decreased activity tolerance, decreased ROM, decreased strength, impaired flexibility, and pain.   ACTIVITY LIMITATIONS: lifting, bending,  sitting, standing, squatting, sleeping, stairs, and locomotion level  PARTICIPATION LIMITATIONS: cleaning, shopping, community activity, and yard work  PERSONAL FACTORS: Fitness, Past/current experiences, and Time since onset of injury/illness/exacerbation are also affecting patient's functional outcome.    GOALS: Goals reviewed with patient? Yes  SHORT TERM GOALS: Target date: 05/08/2024  Patient will be I with initial HEP in order to progress with therapy. Baseline: HEP provided at eval 05/18/2024: independent Goal status: MET  2.  Patient will report left leg pain </= 4/10 in order to reduce functional limitations Baseline: 6/10 05/18/2024: patient reports improvement in pain level Goal status: MET  3.  Patient will exhibit left knee extension to 0 deg in order to improve gait and mobility Baseline: lacking 5 deg 05/18/2024: 0 deg Goal status: MET  LONG TERM GOALS: Target date: 07/10/2024  Patient will be I with final HEP to maintain progress from PT. Baseline: HEP provided at eval 05/29/2024: progressing Goal status: ONGOING  2.  Patient will report PSFS >/= 8 in order to indicate improvement in her functional ability Baseline: 6.4 05/29/2024: 7.8 Goal status: ONGOING  3.  Patient will demonstrate hip strength >/= 4-/5 MMT in order to improve her ability to initiate walking and for lifting tasks Baseline: see  limitations above 05/29/2024: grossly 4-/5 MMT Goal status: MET  4.  Patient will demonstrate knee strength 5/5 MMT in order to improve her walking and activity tolerance Baseline: see limitations above 05/29/2024: grossly 5/5 MMT Goal status: MET  5. Patient will demonstrate 5xSTS </= 12 seconds to indicate improved strength and mobility  Baseline: 23 seconds 05/29/2024: 15 seconds  Goal status: ONGOING   PLAN: PT FREQUENCY: 1x/week  PT DURATION: 6 weeks  PLANNED INTERVENTIONS: 97164- PT Re-evaluation, 97110-Therapeutic exercises, 97530- Therapeutic activity,  97112- Neuromuscular re-education, 97535- Self Care, 16109- Manual therapy, [phone]- Gait training, Patient/Family education, Balance training, Taping, Dry Needling, Joint mobilization, Joint manipulation, Spinal manipulation, Spinal mobilization, Cryotherapy, and Moist heat  PLAN FOR NEXT SESSION: Review HEP and progress PRN, manual/TPDN for left lateral hip and thigh region, manual for left knee mobility, address flexibility deficits of the left hip and thigh, progress core stabilization, hip and knee strengthening    Leah Primus, PT, DPT, LAT, ATC 05/29/24  2:03 PM Phone: [phone] Fax: [phone]

## 2024-06-04 ENCOUNTER — Telehealth: Payer: Self-pay

## 2024-06-04 NOTE — Telephone Encounter (Signed)
 Spoke with patient. Advised per Tiffany. Patient verbalizes understanding and is agreeable.   Encounter closed.

## 2024-06-04 NOTE — Telephone Encounter (Signed)
 Can try OTC hydrocortisone ointment externally to help with irritation/possible reaction to gel.

## 2024-06-04 NOTE — Telephone Encounter (Signed)
 Pt LVM stating that she is having a problem with meds prescribed from TW at last appt.  Msg received also from pt stating that she is experiencing nausea and irritation down below. Made appt for 6/13 with Dr. Andrena Ke but desires advice prior.   Spoke w/ the pt and confirmed that she completed the metrogel  but not consistently due to painful application and also reported seeing some blood with wiping but has done 5 total doses in ~7 days. C/o some nausea some areas of irritation on the outside and a couple of red spots, she is wondering if allergy to the gel.   Pls LDVM when calls back if unable to answer.

## 2024-06-06 ENCOUNTER — Other Ambulatory Visit: Payer: Self-pay

## 2024-06-06 ENCOUNTER — Ambulatory Visit: Admitting: Physical Therapy

## 2024-06-06 ENCOUNTER — Encounter: Payer: Self-pay | Admitting: Physical Therapy

## 2024-06-06 DIAGNOSIS — M25552 Pain in left hip: Secondary | ICD-10-CM

## 2024-06-06 DIAGNOSIS — M6281 Muscle weakness (generalized): Secondary | ICD-10-CM | POA: Diagnosis not present

## 2024-06-06 DIAGNOSIS — M25562 Pain in left knee: Secondary | ICD-10-CM | POA: Diagnosis not present

## 2024-06-06 DIAGNOSIS — R2689 Other abnormalities of gait and mobility: Secondary | ICD-10-CM | POA: Diagnosis not present

## 2024-06-06 DIAGNOSIS — G8929 Other chronic pain: Secondary | ICD-10-CM | POA: Diagnosis not present

## 2024-06-06 NOTE — Therapy (Signed)
 OUTPATIENT PHYSICAL THERAPY TREATMENT   Patient Name: Deborah Barton MRN: [id number] DOB:13-Dec-1946, 78 y.o., female Today's Date: 06/06/2024   END OF SESSION:  PT End of Session - 06/06/24 1605     Visit Number 9    Number of Visits 14    Date for PT Re-Evaluation 07/10/24    Authorization Type [employer] MCR    Authorization Time Period 06/06/2024 - 07/18/2024    Authorization - Visit Number 1    Authorization - Number of Visits 6    Progress Note Due on Visit 10    PT Start Time 1600    PT Stop Time 1640    PT Time Calculation (min) 40 min    Activity Tolerance Patient tolerated treatment well    Behavior During Therapy WFL for tasks assessed/performed                     Past Medical History:  Diagnosis Date   Acid reflux    hx of   Anemia    hx of-   Anxiety    PRN meds   Arthritis    RIGHT knee   Cataract    bilateral sx   Hypertension    situational - not taking med   Neuropathy    Past Surgical History:  Procedure Laterality Date   ABDOMINAL HYSTERECTOMY  1994   CHOLECYSTECTOMY  1975   Patient Active Problem List   Diagnosis Date Noted   GERD (gastroesophageal reflux disease) 04/09/2024   Pulsatile tinnitus, left ear 12/06/2023   Sensorineural hearing loss, bilateral 12/06/2023   Left shoulder pain 07/07/2022   Needle phobia 01/14/2021   Forgetfulness 06/20/2018   OA (osteoarthritis) of knee 01/05/2018   Left knee pain 10/11/2017   Routine general medical examination at a health care facility 11/04/2015   Essential hypertension 01/27/2015   Hereditary and idiopathic peripheral neuropathy 01/27/2015   Generalized anxiety disorder 01/27/2015    PCP: Adelia Homestead, MD  REFERRING PROVIDER: Syliva Even, MD  REFERRING DIAG: Left hip pain; Chronic pain of both knees; Lumbar radiculopathy  THERAPY DIAG:  Chronic pain of left knee  Pain in left hip  Other abnormalities of gait and mobility  Muscle weakness  (generalized)  Rationale for Evaluation and Treatment: Rehabilitation  ONSET DATE: Chronic, 3 years   SUBJECTIVE:  SUBJECTIVE STATEMENT: Patient reports she is doing well, denies any pain at the moment. States her legs have been feeling better and she is going to start at the gym soon.  Eval: Patient reports left knee pain that travels from the left hip down the thigh to the knee and lower leg. She does have some right knee pain as well but it is not as bad. She reports that if she sits for a while, when she goes to stand she has to stand still for a minutes before she can start walking because the left leg won't let her walk and it feels stiff. This has been going on for a couple years with muscle spasms in her legs when she would be stooped for a while, but since then the left knee has been giving her more trouble. She used to do exercises where she was walking, the bike, and some abdominal exercises so would like to know what she can do without making things worse. She does report difficulty lying on the left side. She has had injections in both knees and a recent epidural which did provide some pain relief.  PERTINENT HISTORY: See PMH above  PAIN:  Are you having pain? Yes:  NPRS scale: 0/10 Pain location: Left knee and hip Pain description: Stiffness Aggravating factors: Sitting for long periods and then getting up to walk Relieving factors: Medication  PRECAUTIONS: None  PATIENT GOALS: Pain relief and reduce left leg stiffness with walking   OBJECTIVE:  Note: Objective measures were completed at Evaluation unless otherwise noted. PATIENT SURVEYS:  PSFS: 6.4 Walking after sitting for a while: 5 Bending working in yard: 8 Exercise on left side: 7 Walking for a long period: 5 Wearing heels: 7  05/29/2024: PSFS: 7.8 Walking after sitting for a while: 7 Bending working in yard: 9 Exercise on left side: 9 Walking for a long period: 7 Wearing heels: 7  EDEMA:  Left knee  edema noted compared to right  MUSCLE LENGTH: Limitations with bilateral hamstrings, left hip flexor/quad  POSTURE:   Patient exhibits decreased knee extension on left, weight shift to right  PALPATION: Tender to palpation peripatellar region, left lateral quad/ITB, left lateral gluteal region  LUMBAR ROM:   Active  A/PROM  eval  Flexion WFL  Extension 75%  Right lateral flexion   Left lateral flexion   Right rotation WFL  Left rotation WFL   (Blank rows = not tested)  LOWER EXTREMITY ROM:  Active ROM Right eval Left eval Left 04/19/2024 Left 05/18/2024  Hip flexion      Hip extension      Hip abduction      Hip adduction      Hip internal rotation      Hip external rotation      Knee flexion 130 120    Knee extension 0 5 lacking 5 lacking 0  Ankle dorsiflexion      Ankle plantarflexion      Ankle inversion      Ankle eversion       (Blank rows = not tested)  Bilateral hip ROM grossly [facility]  LOWER EXTREMITY MMT:  MMT Right eval Left eval Left 04/25/2024 Rt / Lt 05/18/2024 Rt / Lt 05/29/2024  Hip flexion 4 4-   4 / 4  Hip extension 3+ 3-   4- / 4-  Hip abduction 3+ 3- 3+  4- / 4-  Hip adduction       Hip internal rotation       Hip external rotation       Knee flexion 4 4   5  / 5  Knee extension 4+ 4  5 / 5 5 / 5  Ankle dorsiflexion       Ankle plantarflexion       Ankle inversion       Ankle eversion        (Blank rows = not tested)  SPECIAL TESTS:  SLR positive on left  FUNCTIONAL TESTS:  5xSTS: 23 seconds  05/29/2024: 15 seconds  GAIT: Assistive device utilized: None Level of assistance: Complete Independence Comments: Antalgic on left  TREATMENT [facility] Adult PT Treatment:                                                DATE: 06/06/2024 Recumbent bike L4 x 5 min to improve endurance and workload capacity SLR 2 x 10  each Bridge 2 x 10 Sidelying hip abduction 3 x 10 LAQ with 5# 3 x 10 each Sit to stand 3 x 10 Standing hip abduction and extension with green at knees 2 x 10 each Deadlift with 25# 3 x 10 Standing heel raise 2 x 15 90-90 alternating leg extensions 2 x 10 Modified front plank on knees 3 x 30 sec  PATIENT EDUCATION:  Education details: HEP update Person educated: Patient Education method: Explanation, Demonstration, Tactile cues, Verbal cues, Handout Education comprehension: verbalized understanding, returned demonstration, verbal cues required, tactile cues required, and needs further education  HOME EXERCISE PROGRAM: Access Code: [email or web address]    ASSESSMENT: CLINICAL IMPRESSION: Patient tolerated therapy well with no adverse effects. Therapy continued to focus on progressing her hip, core, and LE strengthening this visit with good tolerance. She was able to increase weight with her lifting and incorporated more core strengthening for patient without any increase in back pain. Overall she is making great progress in therapy and updated her HEP to include core exercises. Patient would benefit from continued skilled PT to progress her mobility and strength in order to reduce pain and maximize functional ability.   Eval: Patient is a 78 y.o. female who was seen today for physical therapy evaluation and treatment for chronic left leg pain that is primarily in the left knee. She does exhibit some left sided radicular symptoms. She exhibits limitations in her left knee motion and strength deficits of hip and knee greater on the the side. She also exhibit flexibility deficits greater on the left and tenderness of the left gluteal, lateral thigh, and knee region that is likely contributing to her pain and impacting her functional ability.    OBJECTIVE IMPAIRMENTS: Abnormal gait, decreased activity tolerance, decreased ROM, decreased strength, impaired flexibility, and pain.   ACTIVITY LIMITATIONS:  lifting, bending, sitting, standing, squatting, sleeping, stairs, and locomotion level  PARTICIPATION LIMITATIONS: cleaning, shopping, community activity, and yard work  PERSONAL FACTORS: Fitness, Past/current experiences, and Time since onset of injury/illness/exacerbation are also affecting patient's functional outcome.    GOALS: Goals reviewed with patient? Yes  SHORT TERM GOALS: Target date: 05/08/2024  Patient will be I with initial HEP in order to progress with therapy. Baseline: HEP provided at eval 05/18/2024: independent Goal status: MET  2.  Patient will report left leg pain </= 4/10 in order to reduce functional limitations Baseline: 6/10 05/18/2024: patient reports improvement in pain level Goal status: MET  3.  Patient will exhibit left knee extension to 0 deg in order to improve gait and mobility Baseline: lacking 5 deg 05/18/2024: 0 deg Goal status: MET  LONG TERM GOALS: Target date: 07/10/2024  Patient will be I with final HEP to maintain progress from PT. Baseline: HEP provided at eval 05/29/2024: progressing Goal status: ONGOING  2.  Patient will report PSFS >/= 8 in order to indicate improvement in her functional ability Baseline: 6.4 05/29/2024: 7.8 Goal status: ONGOING  3.  Patient will demonstrate hip strength >/= 4-/5 MMT in order to improve her ability to initiate walking and for lifting tasks Baseline:  see limitations above 05/29/2024: grossly 4-/5 MMT Goal status: MET  4.  Patient will demonstrate knee strength 5/5 MMT in order to improve her walking and activity tolerance Baseline: see limitations above 05/29/2024: grossly 5/5 MMT Goal status: MET  5. Patient will demonstrate 5xSTS </= 12 seconds to indicate improved strength and mobility  Baseline: 23 seconds 05/29/2024: 15 seconds  Goal status: ONGOING   PLAN: PT FREQUENCY: 1x/week  PT DURATION: 6 weeks  PLANNED INTERVENTIONS: 97164- PT Re-evaluation, 97110-Therapeutic exercises, 97530-  Therapeutic activity, 97112- Neuromuscular re-education, 97535- Self Care, 16109- Manual therapy, [phone]- Gait training, Patient/Family education, Balance training, Taping, Dry Needling, Joint mobilization, Joint manipulation, Spinal manipulation, Spinal mobilization, Cryotherapy, and Moist heat  PLAN FOR NEXT SESSION: Review HEP and progress PRN, manual/TPDN for left lateral hip and thigh region, manual for left knee mobility, address flexibility deficits of the left hip and thigh, progress core stabilization, hip and knee strengthening    Leah Primus, PT, DPT, LAT, ATC 06/06/24  4:45 PM Phone: [phone] Fax: [phone]

## 2024-06-06 NOTE — Patient Instructions (Signed)
 Access Code: [email or web address] URL: [email or web address] Date: 06/06/2024 Prepared by: Leah Primus  Exercises - Modified Becki Bouton  - 1 x daily - 2 reps - 60 seconds hold - Active Straight Leg Raise with Quad Set  - 1 x daily - 2 sets - 10 reps - Clam with Resistance  - 1 x daily - 2 sets - 10 reps - Supine Bridge with Mini Swiss Ball Between Knees  - 1 x daily - 2 sets - 10 reps - Sit to Stand  - 1 x daily - 3 sets - 10 reps - Seated Long Arc Quad with Ankle Weight  - 1 x daily - 3 sets - 10 reps - Standing Hip Abduction with Ankle Weight  - 1 x daily - 2 sets - 10 reps - Standing Hip Extension with Ankle Weight  - 1 x daily - 7 x weekly - 2 sets - 10 reps - Heel Raises with Counter Support  - 1 x daily - 2 sets - 15 reps - Standing Tandem Balance with Counter Support  - 1 x daily - 3 reps - 30 seconds hold - Supine 90/90 leg extensions  - 1 x daily - 3 sets - 10 reps - Plank on Knees  - 1 x daily - 3 reps - 30 seconds hold

## 2024-06-08 ENCOUNTER — Ambulatory Visit: Admitting: Obstetrics and Gynecology

## 2024-06-12 ENCOUNTER — Encounter: Payer: Self-pay | Admitting: Physical Therapy

## 2024-06-12 ENCOUNTER — Telehealth: Payer: Self-pay | Admitting: Family Medicine

## 2024-06-12 ENCOUNTER — Ambulatory Visit: Admitting: Physical Therapy

## 2024-06-12 ENCOUNTER — Other Ambulatory Visit: Payer: Self-pay

## 2024-06-12 DIAGNOSIS — M6281 Muscle weakness (generalized): Secondary | ICD-10-CM

## 2024-06-12 DIAGNOSIS — M25552 Pain in left hip: Secondary | ICD-10-CM | POA: Diagnosis not present

## 2024-06-12 DIAGNOSIS — G8929 Other chronic pain: Secondary | ICD-10-CM | POA: Diagnosis not present

## 2024-06-12 DIAGNOSIS — R2689 Other abnormalities of gait and mobility: Secondary | ICD-10-CM | POA: Diagnosis not present

## 2024-06-12 DIAGNOSIS — M25562 Pain in left knee: Secondary | ICD-10-CM | POA: Diagnosis not present

## 2024-06-12 NOTE — Telephone Encounter (Signed)
 Pt would like a refill of meloxicam  to [facility] on [address]. States this has helped greatly with her leg pain.

## 2024-06-12 NOTE — Therapy (Signed)
 OUTPATIENT PHYSICAL THERAPY TREATMENT  DISCHARGE  Progress Note Reporting Period 04/10/2024 to 06/12/2024  See note below for Objective Data and Assessment of Progress/Goals.     Patient Name: Deborah Barton MRN: [id number] DOB:[birth year], 78 y.o., female Today's Date: 06/12/2024   END OF SESSION:  PT End of Session - 06/12/24 1257     Visit Number 10    Number of Visits 14    Date for PT Re-Evaluation 07/10/24    Authorization Type [employer] MCR    Authorization Time Period 06/06/2024 - 07/18/2024    Authorization - Visit Number 2    Authorization - Number of Visits 6    Progress Note Due on Visit 10    PT Start Time 1300    PT Stop Time 1340    PT Time Calculation (min) 40 min    Activity Tolerance Patient tolerated treatment well    Behavior During Therapy WFL for tasks assessed/performed                   Past Medical History:  Diagnosis Date   Acid reflux    hx of   Anemia    hx of-   Anxiety    PRN meds   Arthritis    RIGHT knee   Cataract    bilateral sx   Hypertension    situational - not taking med   Neuropathy    Past Surgical History:  Procedure Laterality Date   ABDOMINAL HYSTERECTOMY  1994   CHOLECYSTECTOMY  1975   Patient Active Problem List   Diagnosis Date Noted   GERD (gastroesophageal reflux disease) 04/09/2024   Pulsatile tinnitus, left ear 12/06/2023   Sensorineural hearing loss, bilateral 12/06/2023   Left shoulder pain 07/07/2022   Needle phobia 01/14/2021   Forgetfulness 06/20/2018   OA (osteoarthritis) of knee 01/05/2018   Left knee pain 10/11/2017   Routine general medical examination at a health care facility 11/04/2015   Essential hypertension 01/27/2015   Hereditary and idiopathic peripheral neuropathy 01/27/2015   Generalized anxiety disorder 01/27/2015    PCP: Adelia Homestead, MD  REFERRING PROVIDER: Syliva Even, MD  REFERRING DIAG: Left hip pain; Chronic pain of both knees; Lumbar  radiculopathy  THERAPY DIAG:  Chronic pain of left knee  Pain in left hip  Other abnormalities of gait and mobility  Muscle weakness (generalized)  Rationale for Evaluation and Treatment: Rehabilitation  ONSET DATE: Chronic, 3 years   SUBJECTIVE:  SUBJECTIVE STATEMENT: Patient reports she is doing well with no new issues. She feels like she is ready to be done with therapy. She states she is walking better especially in the morning, and is not having trouble with steps.  Eval: Patient reports left knee pain that travels from the left hip down the thigh to the knee and lower leg. She does have some right knee pain as well but it is not as bad. She reports that if she sits for a while, when she goes to stand she has to stand still for a minutes before she can start walking because the left leg won't let her walk and it feels stiff. This has been going on for a couple years with muscle spasms in her legs when she would be stooped for a while, but since then the left knee has been giving her more trouble. She used to do exercises where she was walking, the bike, and some abdominal exercises so would like to know what she can do without  making things worse. She does report difficulty lying on the left side. She has had injections in both knees and a recent epidural which did provide some pain relief.  PERTINENT HISTORY: See PMH above  PAIN:  Are you having pain? Yes:  NPRS scale: 0/10 Pain location: Left knee and hip Pain description: Stiffness Aggravating factors: Sitting for long periods and then getting up to walk Relieving factors: Medication  PRECAUTIONS: None  PATIENT GOALS: Pain relief and reduce left leg stiffness with walking   OBJECTIVE:  Note: Objective measures were completed at Evaluation unless otherwise noted. PATIENT SURVEYS:  PSFS: 6.4 Walking after sitting for a while: 5 Bending working in yard: 8 Exercise on left side: 7 Walking for a long period: 5 Wearing  heels: 7  05/29/2024: PSFS: 7.8 Walking after sitting for a while: 7 Bending working in yard: 9 Exercise on left side: 9 Walking for a long period: 7 Wearing heels: 7  06/12/2024: PSFS: 8.2 Walking after sitting for a while: 8 Bending working in yard: 9 Exercise on left side: 9 Walking for a long period: 8 Wearing heels: 7  EDEMA:  Left knee edema noted compared to right  MUSCLE LENGTH: Limitations with bilateral hamstrings, left hip flexor/quad  POSTURE:   Patient exhibits decreased knee extension on left, weight shift to right  PALPATION: Tender to palpation peripatellar region, left lateral quad/ITB, left lateral gluteal region  LUMBAR ROM:   Active  A/PROM  eval  Flexion WFL  Extension 75%  Right lateral flexion   Left lateral flexion   Right rotation [facility]  Left rotation WFL   (Blank rows = not tested)  LOWER EXTREMITY ROM:  Active ROM Right eval Left eval Left 04/19/2024 Left 05/18/2024  Hip flexion      Hip extension      Hip abduction      Hip adduction      Hip internal rotation      Hip external rotation      Knee flexion 130 120    Knee extension 0 5 lacking 5 lacking 0  Ankle dorsiflexion      Ankle plantarflexion      Ankle inversion      Ankle eversion       (Blank rows = not tested)  Bilateral hip ROM grossly [facility]  LOWER EXTREMITY MMT:  MMT Right eval Left eval Left 04/25/2024 Rt / Lt 05/18/2024 Rt / Lt 05/29/2024  Hip flexion 4 4-   4 / 4  Hip extension 3+ 3-   4- / 4-  Hip abduction 3+ 3- 3+  4- / 4-  Hip adduction       Hip internal rotation       Hip external rotation       Knee flexion 4 4   5  / 5  Knee extension 4+ 4  5 / 5 5 / 5  Ankle dorsiflexion       Ankle plantarflexion       Ankle inversion       Ankle eversion        (Blank rows = not tested)  SPECIAL TESTS:  SLR positive on left  FUNCTIONAL TESTS:  5xSTS: 23 seconds  05/29/2024: 15 seconds  06/12/2024: 12 seconds  GAIT: Assistive device utilized:  None Level of assistance: Complete Independence Comments: Antalgic on left  TREATMENT [facility] Adult PT Treatment:                                                DATE: 06/12/2024 Recumbent bike L4 x 5 min to improve endurance and workload capacity SLR 2 x 10 each Bridge 2 x 10 Sidelying clamshell with blue 3 x 10 90-90 alternating leg extensions 2 x 20 LAQ with 5# 3 x 10 each Sit to stand holding 15# 3 x 10 Deadlift with 30# 3 x 10  PATIENT EDUCATION:  Education details: HEP Person educated: Patient Education method: Explanation Education comprehension: verbalized understanding  HOME EXERCISE PROGRAM: Access Code: [email or web address]    ASSESSMENT: CLINICAL IMPRESSION: Patient tolerated therapy well with no adverse effects. She reports improvement in her functional status and demonstrates improved LE strength with her 5xSTS this visit. Therapy continued focus on strengthening for the core and LE with good tolerance. No changes made to her HEP this visit. Patient has achieved all established goals so will be formally discharge from PT at this time.   Eval: Patient is a 78 y.o. female who was seen today for physical therapy evaluation and treatment for chronic left leg pain that is primarily in the left knee. She does exhibit some left sided radicular symptoms. She exhibits limitations in her left knee motion and strength deficits of hip and knee greater on the the side. She also exhibit flexibility deficits greater on the left and tenderness of the left gluteal, lateral thigh, and knee region that is likely contributing to her pain and impacting her functional ability.    OBJECTIVE IMPAIRMENTS: Abnormal gait, decreased activity tolerance, decreased ROM, decreased strength, impaired flexibility, and pain.   ACTIVITY LIMITATIONS: lifting, bending, sitting, standing, squatting,  sleeping, stairs, and locomotion level  PARTICIPATION LIMITATIONS: cleaning, shopping, community activity, and yard work  PERSONAL FACTORS: Fitness, Past/current experiences, and Time since onset of injury/illness/exacerbation are also affecting patient's functional outcome.    GOALS: Goals reviewed with patient? Yes  SHORT TERM GOALS: Target date: 05/08/2024  Patient will be I with initial HEP in order to progress with therapy. Baseline: HEP provided at eval 05/18/2024: independent Goal status: MET  2.  Patient will report left leg pain </= 4/10 in order to reduce functional limitations Baseline: 6/10 05/18/2024: patient reports improvement in pain level Goal status: MET  3.  Patient will exhibit left knee extension to 0 deg in order to improve gait and mobility Baseline: lacking 5 deg 05/18/2024: 0 deg Goal status: MET  LONG TERM GOALS: Target date: 07/10/2024  Patient will be I with final HEP to maintain progress from PT. Baseline: HEP provided at eval 05/29/2024: progressing 06/12/2024: independent Goal status: MET  2.  Patient will report PSFS >/= 8 in order to indicate improvement in her functional ability Baseline: 6.4 05/29/2024: 7.8 06/12/2024: 8.2 Goal status: MET  3.  Patient will demonstrate hip strength >/= 4-/5 MMT in order to improve her ability to initiate walking and for lifting tasks Baseline: see limitations above 05/29/2024: grossly 4-/5 MMT Goal status: MET  4.  Patient will demonstrate knee strength 5/5 MMT in order to improve her walking and activity tolerance Baseline: see limitations above 05/29/2024: grossly 5/5 MMT Goal status: MET  5. Patient will demonstrate 5xSTS </= 12 seconds to indicate improved strength and mobility  Baseline: 23 seconds 05/29/2024: 15 seconds 06/12/2024: 12  seconds  Goal status: MET   PLAN: PT FREQUENCY: 1x/week  PT DURATION: 6 weeks  PLANNED INTERVENTIONS: 97164- PT Re-evaluation, 97110-Therapeutic exercises, 97530-  Therapeutic activity, 97112- Neuromuscular re-education, 97535- Self Care, 97140- Manual therapy, [phone]- Gait training, Patient/Family education, Balance training, Taping, Dry Needling, Joint mobilization, Joint manipulation, Spinal manipulation, Spinal mobilization, Cryotherapy, and Moist heat  PLAN FOR NEXT SESSION: NA - discharge    Leah Primus, PT, DPT, LAT, ATC 06/12/24  1:42 PM Phone: [phone] Fax: [phone]  PHYSICAL THERAPY DISCHARGE SUMMARY  Visits from Start of Care: 10  Current functional level related to goals / functional outcomes: See above   Remaining deficits: See above   Education / Equipment: HEP   Patient agrees to discharge. Patient goals were met. Patient is being discharged due to meeting the stated rehab goals.

## 2024-06-13 MED ORDER — MELOXICAM 15 MG PO TABS: 15.0000 mg | ORAL_TABLET | Freq: Every day | ORAL | 1 refills | Status: AC | PRN

## 2024-06-13 NOTE — Telephone Encounter (Signed)
 Called and spoke with patient and advised per Dr Alease Hunter. I also advised pt not to take other NSAIDs if she takes the Meloxicam . Pt verbalized understanding and states that she is not taking Meloxicam  daily, just for sx exacerbation.

## 2024-06-13 NOTE — Telephone Encounter (Signed)
 Medicine sent to the [facility] pharmacy.  Please advise her to take this medicine sparingly.

## 2024-06-13 NOTE — Addendum Note (Signed)
 Addended by: Syliva Even on: 06/13/2024 06:57 AM   Modules accepted: Orders

## 2024-07-04 ENCOUNTER — Telehealth: Payer: Self-pay | Admitting: Internal Medicine

## 2024-07-04 NOTE — Telephone Encounter (Signed)
 Copied from CRM [id number]. Topic: Clinical - Medical Advice >> Jul 04, 2024  8:14 AM Deborah Barton wrote: Reason for CRM: Patient lives with her son and he tested positive for Covid, wanting to know if she can get a medication sent in just incase she starts getting any symptoms. Please reach out as soon as possible. Wanting to use [facility] on [address] in [place]

## 2024-07-04 NOTE — Telephone Encounter (Signed)
 Copied from CRM [id number]. Topic: General - Call Back - No Documentation >> Jul 04, 2024  3:28 PM Leah C wrote: Reason for CRM: Patient had put a request in earlier about receiving a medication due to her son testing positive for Covid. Per Epic, notes are showing that it was routed to Dr. Garald and is awaiting response. Advised patient of awaiting physicians sign off. Patient asked would Dr. Garald approve it even though Dr. Rollene is her provider.

## 2024-07-05 ENCOUNTER — Encounter: Payer: Self-pay | Admitting: Internal Medicine

## 2024-07-05 ENCOUNTER — Telehealth: Admitting: Internal Medicine

## 2024-07-05 ENCOUNTER — Telehealth: Payer: Self-pay

## 2024-07-05 DIAGNOSIS — U071 COVID-19: Secondary | ICD-10-CM | POA: Diagnosis not present

## 2024-07-05 MED ORDER — MOLNUPIRAVIR EUA 200MG CAPSULE: 4.0000 | ORAL_CAPSULE | Freq: Two times a day (BID) | ORAL | 0 refills | Status: DC

## 2024-07-05 NOTE — Telephone Encounter (Signed)
 The patient does not need a COVID medication if she is not sick. If she is ill, make a a virtual appoint with any provider, please Thanks

## 2024-07-05 NOTE — Telephone Encounter (Signed)
 Copied from CRM [id number]. Topic: Clinical - Prescription Issue >> Jul 05, 2024  1:19 PM Suzen RAMAN wrote: Reason for CRM: Patient would like to know if there is a more affordable option for COVID(molnupiravir  EUA (LAGEVRIO ) 200 mg CAPS capsule) $180.00 after insurance and patient can't afford Please contact  patient to advise CB# [phone].

## 2024-07-05 NOTE — Progress Notes (Signed)
 Virtual Visit via Telephone Note  I connected with Deborah Barton on 01/20/21 at  9:30 AM EST by telephone and verified that I am speaking with the correct person using two identifiers.  Location:  Persons participating in the virtual visit: patient, provider Patient: home Provider: [facility]   I discussed the limitations, risks, security and privacy concerns of performing an evaluation and management service by telephone and the availability of in person appointments. I also discussed with the patient that there may be a patient responsible charge related to this service. The patient expressed understanding and agreed to proceed.  No chief complaint on file.    History of Present Illness:  Cold sx's x 2 days. COVID test is (+) this am. Son has COVID. We were not able to connect virtually.   Review of Systems  Constitutional:  Positive for chills, diaphoresis, fever and malaise/fatigue.  HENT:  Positive for congestion and sore throat. Negative for ear discharge and ear pain.   Eyes:  Negative for blurred vision.  Respiratory:  Positive for cough. Negative for hemoptysis, shortness of breath and wheezing.   Cardiovascular:  Negative for chest pain, palpitations, orthopnea and leg swelling.  Genitourinary:  Negative for dysuria.  Skin:  Negative for rash.  Neurological:  Positive for headaches. Negative for weakness.  Psychiatric/Behavioral:  The patient is not nervous/anxious.      Observations/Objective: NAD on the phone  Assessment and Plan:  Problem List Items Addressed This Visit     COVID-19 - Primary   We were not able to connect virtually. Cold sx's x 2 days. COVID test is (+) this am. Son has COVID. We were not able to connect virtually. See Rx      Relevant Medications   molnupiravir  EUA (LAGEVRIO ) 200 mg CAPS capsule     Meds ordered this encounter  Medications   molnupiravir  EUA (LAGEVRIO ) 200 mg CAPS capsule    Sig: Take 4 capsules (800 mg total) by  mouth 2 (two) times daily for 5 days.    Dispense:  40 capsule    Refill:  0    COVID19      Follow Up Instructions:    I discussed the assessment and treatment plan with the patient. The patient was provided an opportunity to ask questions and all were answered. The patient agreed with the plan and demonstrated an understanding of the instructions.   The patient was advised to call back or seek an in-person evaluation if the symptoms worsen or if the condition fails to improve as anticipated.  I provided 15 minutes of non-face-to-face time during this encounter.   Marolyn Noel, MD

## 2024-07-05 NOTE — Assessment & Plan Note (Signed)
 We were not able to connect virtually. Cold sx's x 2 days. COVID test is (+) this am. Son has COVID. We were not able to connect virtually. See Rx

## 2024-07-06 MED ORDER — NIRMATRELVIR/RITONAVIR (PAXLOVID)TABLET: 3.0000 | ORAL_TABLET | Freq: Two times a day (BID) | ORAL | 0 refills | Status: AC

## 2024-07-06 NOTE — Telephone Encounter (Signed)
 I emailed a prescription for Paxlovid .  Hope it is covered better by her insurance.  Thank you  For a mild COVID-19 case - take zinc 50 mg a day for 1 week, vitamin C 1000 mg daily for 1 week, vitamin D2 50,000 units weekly for 2 months (unless  taking vitamin D  daily already)Take Allegra or Benadryl .  Maintain good oral hydration and take Tylenol  for high fever.  Thx

## 2024-07-06 NOTE — Telephone Encounter (Signed)
 Called patient and went over advise with her in regards to not feeling well and advise her to also call her pharmacy to pick up her medication

## 2024-07-23 ENCOUNTER — Emergency Department (HOSPITAL_COMMUNITY)
Admission: EM | Admit: 2024-07-23 | Discharge: 2024-07-23 | Disposition: A | Discharge status: Home / Self Care | Attending: Emergency Medicine | Admitting: Emergency Medicine

## 2024-07-23 ENCOUNTER — Emergency Department (HOSPITAL_COMMUNITY)

## 2024-07-23 ENCOUNTER — Ambulatory Visit: Payer: Self-pay

## 2024-07-23 ENCOUNTER — Other Ambulatory Visit: Payer: Self-pay

## 2024-07-23 DIAGNOSIS — H579 Unspecified disorder of eye and adnexa: Secondary | ICD-10-CM | POA: Diagnosis not present

## 2024-07-23 DIAGNOSIS — I10 Essential (primary) hypertension: Secondary | ICD-10-CM | POA: Diagnosis not present

## 2024-07-23 DIAGNOSIS — Z7982 Long term (current) use of aspirin: Secondary | ICD-10-CM | POA: Diagnosis not present

## 2024-07-23 DIAGNOSIS — Z79899 Other long term (current) drug therapy: Secondary | ICD-10-CM | POA: Insufficient documentation

## 2024-07-23 DIAGNOSIS — R519 Headache, unspecified: Secondary | ICD-10-CM | POA: Diagnosis not present

## 2024-07-23 DIAGNOSIS — Z0389 Encounter for observation for other suspected diseases and conditions ruled out: Secondary | ICD-10-CM | POA: Diagnosis not present

## 2024-07-23 DIAGNOSIS — R110 Nausea: Secondary | ICD-10-CM | POA: Diagnosis not present

## 2024-07-23 LAB — BASIC METABOLIC PANEL WITH GFR
Anion gap: 9 (ref 5–15)
BUN: 14 mg/dL (ref 8–23)
CO2: 25 mmol/L (ref 22–32)
Calcium: 9.3 mg/dL (ref 8.9–10.3)
Chloride: 100 mmol/L (ref 98–111)
Creatinine, Ser: 0.85 mg/dL (ref 0.44–1.00)
GFR, Estimated: >60 mL/min (ref >60)
Glucose, Bld: 110 mg/dL — ABNORMAL HIGH (ref 70–99)
Potassium: 4.1 mmol/L (ref 3.5–5.1)
Sodium: 134 mmol/L — ABNORMAL LOW (ref 135–145)

## 2024-07-23 LAB — CBC
HCT: 35.4 % — ABNORMAL LOW (ref 36.0–46.0)
Hemoglobin: 11 g/dL — ABNORMAL LOW (ref 12.0–15.0)
MCH: 23.1 pg — ABNORMAL LOW (ref 26.0–34.0)
MCHC: 31.1 g/dL (ref 30.0–36.0)
MCV: 74.2 fL — ABNORMAL LOW (ref 80.0–100.0)
Platelets: 277 K/uL (ref 150–400)
RBC: 4.77 MIL/uL (ref 3.87–5.11)
RDW: 15.9 % — ABNORMAL HIGH (ref 11.5–15.5)
WBC: 8.8 K/uL (ref 4.0–10.5)
nRBC: 0 % (ref 0.0–0.2)

## 2024-07-23 MED ORDER — METOCLOPRAMIDE HCL 5 MG/ML IJ SOLN
10.0000 mg | Freq: Once | INTRAMUSCULAR | Status: AC
  Administered 2024-07-23: 10 mg via INTRAVENOUS
  Filled 2024-07-23: qty 2

## 2024-07-23 MED ORDER — AMLODIPINE BESYLATE 5 MG PO TABS
5.0000 mg | ORAL_TABLET | Freq: Every day | ORAL | 0 refills | Status: DC
Start: 2024-07-23 — End: 2024-08-22

## 2024-07-23 MED ORDER — SODIUM CHLORIDE 0.9 % IV BOLUS
1000.0000 mL | Freq: Once | INTRAVENOUS | Status: AC
  Administered 2024-07-23: 1000 mL via INTRAVENOUS

## 2024-07-23 MED ORDER — IOHEXOL 350 MG/ML SOLN
75.0000 mL | Freq: Once | INTRAVENOUS | Status: AC | PRN
  Administered 2024-07-23: 75 mL via INTRAVENOUS

## 2024-07-23 MED ORDER — KETOROLAC TROMETHAMINE 30 MG/ML IJ SOLN
30.0000 mg | Freq: Once | INTRAMUSCULAR | Status: AC
  Administered 2024-07-23: 30 mg via INTRAVENOUS
  Filled 2024-07-23: qty 1

## 2024-07-23 MED ORDER — METOCLOPRAMIDE HCL 10 MG PO TABS: 10.0000 mg | ORAL_TABLET | Freq: Three times a day (TID) | ORAL | 0 refills | Status: AC | PRN

## 2024-07-23 NOTE — ED Provider Triage Note (Signed)
 Emergency Medicine Provider Triage Evaluation Note  Deborah Barton , a 78 y.o. female  was evaluated in triage.  Pt complains of headache, left sided temporal, intermittent black spots in vision in both eyes for about 10 days.  She had covid at the start of the month which felt like a bad headcold.  Hx of migraines in her 20's but not since then.  Review of Systems  Positive: Headache, spots in vision Negative: Fever, LOC  Physical Exam  BP (!) 172/89 (BP Location: Right Arm)   Pulse 72   Temp 98.8 F (37.1 C) (Oral)   Resp 14   Ht 5' 1 (1.549 m)   Wt 57.6 kg   SpO2 96%   BMI 23.99 kg/m  Gen:   Awake, no distress   Resp:  Normal effort  MSK:   Moves extremities without difficulty  Other:  Vision grossly intact, no proptosis on ocular exam, PERRL  Medical Decision Making  Medically screening exam initiated at 6:43 PM.  Appropriate orders placed.  Deborah Barton was informed that the remainder of the evaluation will be completed by another provider, this initial triage assessment does not replace that evaluation, and the importance of remaining in the ED until their evaluation is complete.  Headache and eye pain evaluation   Deborah Barton PARAS, MD 07/23/24 [phone]

## 2024-07-23 NOTE — Telephone Encounter (Signed)
 Triaged to ED via 911/EMS  FYI Only or Action Required?: FYI only for provider.  Patient was last seen in primary care on 07/05/2024 by Plotnikov, Karlynn GAILS, MD.  Called Nurse Triage reporting Blurred Vision and Headache.  Symptoms began yesterday.  Interventions attempted: OTC medications: tylenol .  Symptoms are: rapidly worsening.  Triage Disposition: Call EMS 911 Now  Patient/caregiver understands and will follow disposition?: Yes Reason for Disposition  Sounds like a life-threatening emergency to the triager  Answer Assessment - Initial Assessment Questions Pt states BP yesterday was 140/something. Reports BP tonight is 169/110. Speaking in clear and full sentences. Denies numbness or weakness on one or both sides of the body. -Note: pt had covid on 7/10  This RN contacted 911 on behalf of the patient and with her permission. Advised pt to collect her medications, unlock her door and secure any pets. Stayed on the phone until EMS arrival.   1. SYMPTOM: What is the main symptom you are concerned about? (e.g., weakness, numbness)     Left sided headache - 9/10  2. ONSET: When did this start? (e.g., minutes, hours, days; while sleeping)     07/22/24  4. PATTERN Does this come and go, or has it been constant since it started?  Is it present now?     Constant now  5. CARDIAC SYMPTOMS: Have you had any of the following symptoms: chest pain, difficulty breathing, palpitations?     Denies  6. NEUROLOGIC SYMPTOMS: Have you had any of the following symptoms: headache, dizziness, vision loss, double vision, changes in speech, unsteady on your feet?     Vision changes x 1 week, nausea, slight unsteadiness  Protocols used: Neurologic Deficit-A-AH Copied from CRM #[id number]. Topic: Clinical - Red Word Triage >> Jul 23, 2024  4:26 PM Chiquita SQUIBB wrote: Red Word that prompted transfer to Nurse Triage: Patient is calling in stating she has blurry vision that comes and goes and a  severe migraine on the left side of her head.

## 2024-07-23 NOTE — ED Provider Notes (Signed)
 [place] EMERGENCY DEPARTMENT AT [facility] Provider Note   CSN: [id number] Arrival date & time: 07/23/24  1730     Patient presents with: Headache   Deborah Barton is a 78 y.o. female reports she has had intermittent left sided headache behind her left eye for 4 days.  Reports hx of migraines in her 20's.  Also noticing black spots in vision of both eyes, on and off for a few days.    She had covid 2-3 weeks ago  {Add pertinent medical, surgical, social history, OB history to HPI:32947} HPI     Prior to Admission medications   Medication Sig Start Date End Date Taking? Authorizing Provider  Ascorbic Acid (VITAMIN C) 1000 MG tablet Take 1,000 mg by mouth daily.    [provider]  aspirin EC 81 MG tablet Take by mouth. 05/13/21   [provider]  benzonatate  (TESSALON ) 100 MG capsule Take 1 capsule (100 mg total) by mouth 2 (two) times daily as needed for cough. 01/04/24   Elnor Lauraine BRAVO, NP  cholecalciferol (VITAMIN D3) 25 MCG (1000 UT) tablet Take 1,000 Units by mouth daily.    [provider]  cyclobenzaprine  (FLEXERIL ) 5 MG tablet Take 1 tablet (5 mg total) by mouth 3 (three) times daily as needed for muscle spasms. 12/12/23   Rollene Almarie LABOR, MD  DULoxetine  (CYMBALTA ) 60 MG capsule Take 1 capsule (60 mg total) by mouth daily. 12/12/23   Rollene Almarie LABOR, MD  famotidine  (PEPCID ) 20 MG tablet Take 1 tablet (20 mg total) by mouth daily. 04/04/24   Kingsley, Victoria K, DO  fluticasone  (FLONASE ) 50 MCG/ACT nasal spray Place 2 sprays into both nostrils daily. 01/01/22   Rollene Almarie LABOR, MD  gabapentin  (NEURONTIN ) 100 MG capsule Take 1-3 capsules (100-300 mg total) by mouth at bedtime as needed. 01/24/24   Corey, Evan S, MD  hydroxypropyl methylcellulose / hypromellose (ISOPTO TEARS / GONIOVISC) 2.5 % ophthalmic solution Place 1 drop into both eyes as needed for dry eyes.    [provider]  LORazepam  (ATIVAN ) 1 MG  tablet Take 0.5-1 tablets (0.5-1 mg total) by mouth at bedtime. 04/09/24   Rollene Almarie LABOR, MD  meloxicam  (MOBIC ) 15 MG tablet Take 1 tablet (15 mg total) by mouth daily as needed for pain. 06/13/24   Corey, Evan S, MD  Multiple Vitamin (MULTIVITAMIN WITH MINERALS) TABS tablet Take 1 tablet by mouth daily. Adult 50+ vit.    [provider]  ondansetron  (ZOFRAN ) 4 MG tablet Take 1 tablet (4 mg total) by mouth every 6 (six) hours. 04/04/24   Kingsley, Victoria K, DO  pantoprazole  (PROTONIX ) 40 MG tablet Take 1 tablet (40 mg total) by mouth daily. 04/09/24   Rollene Almarie LABOR, MD  sucralfate  (CARAFATE ) 1 g tablet Take 1 tablet (1 g total) by mouth 4 (four) times daily -  with meals and at bedtime for 14 days. Patient not taking: Reported on 04/09/2024 04/04/24 04/18/24  Kingsley, Victoria K, DO  vitamin B-12 (CYANOCOBALAMIN ) 500 MCG tablet Take 500 mcg by mouth daily.    [provider]    Allergies: Patient has no known allergies.    Review of Systems  Updated Vital Signs BP (!) 190/84   Pulse 72   Temp 98.8 F (37.1 C) (Oral)   Resp 14   Ht 5' 1 (1.549 m)   Wt 57.6 kg   SpO2 96%   BMI 23.99 kg/m   Physical Exam Constitutional:  General: She is not in acute distress. HENT:     Head: Normocephalic and atraumatic.     Comments: No nuccal rigidity No temporal artery tenderness Eyes:     Conjunctiva/sclera: Conjunctivae normal.     Pupils: Pupils are equal, round, and reactive to light.  Cardiovascular:     Rate and Rhythm: Normal rate and regular rhythm.  Pulmonary:     Effort: Pulmonary effort is normal. No respiratory distress.  Abdominal:     General: There is no distension.     Tenderness: There is no abdominal tenderness.  Skin:    General: Skin is warm and dry.  Neurological:     General: No focal deficit present.     Mental Status: She is alert. Mental status is at baseline.  Psychiatric:        Mood and Affect: Mood normal.        Behavior:  Behavior normal.     (all labs ordered are listed, but only abnormal results are displayed) Labs Reviewed  CBC - Abnormal; Notable for the following components:      Result Value   Hemoglobin 11.0 (*)    HCT 35.4 (*)    MCV 74.2 (*)    MCH 23.1 (*)    RDW 15.9 (*)    All other components within normal limits  BASIC METABOLIC PANEL WITH GFR    EKG: None  Radiology: No results found.  {Document cardiac monitor, telemetry assessment procedure when appropriate:32947} Procedures   Medications Ordered in the ED  metoCLOPramide  (REGLAN ) injection 10 mg (10 mg Intravenous Given 07/23/24 1944)  ketorolac  (TORADOL ) 30 MG/ML injection 30 mg (30 mg Intravenous Given 07/23/24 1945)  sodium chloride  0.9 % bolus 1,000 mL (1,000 mLs Intravenous New Bag/Given 07/23/24 1948)      {Click here for ABCD2, HEART and other calculators REFRESH Note before signing:1}                              Medical Decision Making Amount and/or Complexity of Data Reviewed Labs: ordered. Radiology: ordered.  Risk Prescription drug management.   This patient presents to the Emergency Department with complaint of headache.  This involves an extensive number of treatment options, and is a complaint that carries with it a high risk of complications and morbidity.  The differential diagnosis for headache includes tension type headache vs occipital headache vs migraine vs sinusitis vs intracranial bleed vs other  I ordered, reviewed, and interpreted labs, including BMP and CBC.  There were no immediate, life-threatening emergencies found in this labwork.   I ordered medication for headache and/or nausea I ordered imaging studies which included CT venogram  I independently visualized and interpreted imaging which showed *** and the monitor tracing which showed *** Additional history was obtained from *** Previous records obtained and reviewed showing *** I personally reviewed the patients ECG *** which showed  sinus rhythm with no acute ischemic findings  I consulted *** and discussed lab and imaging findings  After the interventions stated above, I reevaluated the patient and found that the patient remained clinically stable.  Based on the patient's clinical exam, vital signs, risk factors, and ED testing, I felt that the patient's overall risk of life-threatening emergency such as ICH, meningitis, intracranial mass or tumor was quite low.  I suspect this clinical presentation is most consistent with ***, but explained to the patient that this evaluation was not a definitive diagnostic  workup.  I discussed outpatient follow up with primary care provider, and provided specialist office number on the patient's discharge paper if a referral was deemed necessary.  I discussed return precautions with the patient. I felt the patient was clinically stable for discharge.   {Document critical care time when appropriate  Document review of labs and clinical decision tools ie CHADS2VASC2, etc  Document your independent review of radiology images and any outside records  Document your discussion with family members, caretakers and with consultants  Document social determinants of health affecting pt's care  Document your decision making why or why not admission, treatments were needed:32947:::1}   Final diagnoses:  None    ED Discharge Orders     None

## 2024-07-23 NOTE — ED Triage Notes (Signed)
 Left sided headache/pressure that started yesterday. It went away while sleeping but came back an hour ago. Reports some blurry vision and nausea without vomiting.  EMS noted some bruising on her eye lid but patient denies trauma. Says it happened spontaneously.   182/96  70s 118 glucose 20 LAC - 4mg  zofran 

## 2024-07-24 ENCOUNTER — Ambulatory Visit: Payer: Self-pay

## 2024-07-24 NOTE — Telephone Encounter (Signed)
 FYI Only or Action Required?: FYI only for provider.  Patient was last seen in primary care on 07/05/2024 by Plotnikov, Karlynn GAILS, MD.  Called Nurse Triage reporting Hypertension.  Symptoms began several days ago.  Interventions attempted: Prescription medications: Amlodipine , Metoclopramide .  Symptoms are: unchanged.  Triage Disposition: See PCP Within 2 Weeks  Patient/caregiver understands and will follow disposition?: Yes   Copied from CRM [id number]. Topic: Clinical - Red Word Triage >> Jul 24, 2024  2:42 PM Deborah Barton wrote: Reason for CRM: patient called stating she was told to call back and give a report of her hospital visit. Patient went to the hospital at three on yesterday and stayed in the hospital until 11pm. The hospital took several blood pressure readings and the highest was 190/101 and they got it down to 167/90 before she left. The patient is taking blood pressure readings at home and they gave her medication-amlodipine  5mg  and metoclopramide  10mg . The hospital is sending her to [facility]. Patient was diagnosed with hypertension. The patient blood pressure this morning at 10:30am was 159/99 and she took her medication Reason for Disposition  [1] Systolic BP >= 130 OR Diastolic >= 80 AND [2] taking BP medications  Answer Assessment - Initial Assessment Questions 1. BLOOD PRESSURE: What is your blood pressure? Did you take at least two measurements 5 minutes apart?     10:51, 159/99  2. ONSET: When did you take your blood pressure?     This morning  3. HOW: How did you take your blood pressure? (e.g., automatic home BP monitor, visiting nurse)     Automatic BP Monitor  4. HISTORY: Do you have a history of high blood pressure?     New onset  5. MEDICINES: Are you taking any medicines for blood pressure? Have you missed any doses recently?     Yes, Amlodipine   6. OTHER SYMPTOMS: Do you have any symptoms? (e.g., blurred vision, chest pain,  difficulty breathing, headache, weakness)     Headache, Nausea  7. PREGNANCY: Is there any chance you are pregnant? When was your last menstrual period?     No and No  Protocols used: Blood Pressure - High-A-AH

## 2024-07-25 ENCOUNTER — Telehealth: Payer: Self-pay | Admitting: Internal Medicine

## 2024-07-25 ENCOUNTER — Ambulatory Visit: Payer: Self-pay

## 2024-07-25 NOTE — Telephone Encounter (Signed)
 FYI Only or Action Required?: FYI only for provider.  Patient was last seen in primary care on 07/05/2024 by Plotnikov, Karlynn GAILS, MD.  Called Nurse Triage reporting Hypertension.  Symptoms began several days ago.  Interventions attempted: Prescription medications: amlodipine .  Symptoms are: unchanged.  Triage Disposition: See PCP Within 2 Weeks  Patient/caregiver understands and will follow disposition?: Yes  Message from [place] A sent at 07/25/2024  8:38 AM EDT  Summary: Medication Question   Patient called because she has Covid and is taking Paxlovid  and her pressure has gone up. Patient went to ER due to high pressure and was given blood pressure medication, but patient states that medication is making her pressure rise higher. Patient would like to know if she should continue to take medication. Please call patient on her cell phone         Reason for Disposition  [1] Systolic BP >= 130 OR Diastolic >= 80 AND [2] taking BP medications  Answer Assessment - Initial Assessment Questions 1. BLOOD PRESSURE: What is your blood pressure? Did you take at least two measurements 5 minutes apart?     160/112 at 0832 this morning 2. ONSET: When did you take your blood pressure?     145/91 at 0947, 30 minutes after taking medicine 3. HOW: How did you take your blood pressure? (e.g., automatic home BP monitor, visiting nurse)     Automatic BP cuff 4. HISTORY: Do you have a history of high blood pressure?     New onset 5. MEDICINES: Are you taking any medicines for blood pressure? Have you missed any doses recently?   amlodipine  6. OTHER SYMPTOMS: Do you have any symptoms? (e.g., blurred vision, chest pain, difficulty breathing, headache, weakness)     Blurry eyes, a little nauseated, and a funny feeling in her head Denies chest pain, shortness of breath  Protocols used: Blood Pressure - High-A-AH

## 2024-07-25 NOTE — Telephone Encounter (Unsigned)
 Copied from CRM [id number]. Topic: Clinical - Pink Word Triage >> Jul 25, 2024  8:48 AM [place] A wrote: Reason for Triage: Patient called because she has Covid and is taking Paxlovid  and her pressure has gone up. Patient went to ER due to high pressure and was given blood pressure medication, but patient states that medication is making her pressure rise higher. Patient would like to know if she should continue to take medication. Please call patient on her cell phone. Patient did not state she was having any symptoms. >> Jul 25, 2024  8:49 AM [place] A wrote:

## 2024-07-25 NOTE — Telephone Encounter (Unsigned)
 Copied from CRM [id number]. Topic: Clinical - Pink Word Triage >> Jul 25, 2024  8:36 AM [place] A wrote: Reason for Triage: *** >> Jul 25, 2024  8:38 AM [place] A wrote: Patient called because she has Covid and is taking Paxlovid  and her pressure has gone up. Patient went to ER due to high pressure and was given blood pressure medication, but patient states that medication is making her pressure rise higher. Patient would like to know if she should continue to take medication. Please call patient on her cell phone

## 2024-07-26 ENCOUNTER — Ambulatory Visit (INDEPENDENT_AMBULATORY_CARE_PROVIDER_SITE_OTHER): Admitting: Internal Medicine

## 2024-07-26 ENCOUNTER — Telehealth: Payer: Self-pay | Admitting: *Deleted

## 2024-07-26 ENCOUNTER — Encounter: Payer: Self-pay | Admitting: Internal Medicine

## 2024-07-26 VITALS — BP 152/90 | HR 79 | Temp 97.9°F | Ht 61.0 in | Wt 127.6 lb

## 2024-07-26 DIAGNOSIS — F411 Generalized anxiety disorder: Secondary | ICD-10-CM | POA: Diagnosis not present

## 2024-07-26 DIAGNOSIS — G43809 Other migraine, not intractable, without status migrainosus: Secondary | ICD-10-CM | POA: Diagnosis not present

## 2024-07-26 DIAGNOSIS — G43909 Migraine, unspecified, not intractable, without status migrainosus: Secondary | ICD-10-CM | POA: Insufficient documentation

## 2024-07-26 DIAGNOSIS — I10 Essential (primary) hypertension: Secondary | ICD-10-CM | POA: Diagnosis not present

## 2024-07-26 MED ORDER — AMLODIPINE BESYLATE 5 MG PO TABS: 5.0000 mg | ORAL_TABLET | Freq: Every day | ORAL | 5 refills | Status: DC

## 2024-07-26 NOTE — Progress Notes (Signed)
 Patient ID: Deborah Barton, female   DOB: 1946/03/30, 78 y.o.   MRN: [id number]        Chief Complaint: follow up htn new onset, anxiety, headaches       HPI:  Deborah Barton is a 78 y.o. female here with c/o recent onset new HTN with sbp 150 - 170 recently, Pt denies chest pain, increased sob or doe, wheezing, orthopnea, PND, increased LE swelling, palpitations, dizziness or syncope.   Pt denies polydipsia, polyuria, or new focal neuro s/s.    Pt denies fever, wt loss, night sweats, loss of appetite, or other constitutional symptoms  Was seen at ED July 28 and started amlodipine  5 mg x 2 days.  Did have recent covid infection July 6 but seemed to be resolved.  Has significant stress related to her son, Deborah Barton  at bedtime helping the sleep.  Also with recurring left sided throbbing headaches, tx with maxalt prn with no hx of heart disease contraindication.         Wt Readings from Last 3 Encounters:  07/26/24 127 lb 9.6 oz (57.9 kg)  07/23/24 126 lb 15.8 oz (57.6 kg)  05/23/24 127 lb (57.6 kg)   BP Readings from Last 3 Encounters:  07/26/24 (!) 152/90  07/23/24 (!) 163/84  05/23/24 132/82         Past Medical History:  Diagnosis Date   Acid reflux    hx of   Anemia    hx of-   Anxiety    PRN meds   Arthritis    RIGHT knee   Cataract    bilateral sx   Hypertension    situational - not taking med   Neuropathy    Past Surgical History:  Procedure Laterality Date   ABDOMINAL HYSTERECTOMY  1994   CHOLECYSTECTOMY  1975    reports that she has never smoked. She has never used smokeless tobacco. She reports current alcohol use of about 3.0 standard drinks of alcohol per week. She reports that she does not use drugs. family history includes Anemia in an other family member; Arthritis in an other family member; Asthma in an other family member; Breast cancer in her paternal grandmother; Depression in an other family member; Hypertension in an other family member;  Ovarian cancer (age of onset: 57) in her maternal aunt; Ovarian cancer (age of onset: 47) in her maternal grandmother; Stroke in an other family member; Uterine cancer (age of onset: 55) in her sister. No Known Allergies Current Outpatient Medications on File Prior to Visit  Medication Sig Dispense Refill   Ascorbic Acid (VITAMIN C) 1000 MG tablet Take 1,000 mg by mouth daily.     aspirin EC 81 MG tablet Take by mouth.     cholecalciferol (VITAMIN D3) 25 MCG (1000 UT) tablet Take 1,000 Units by mouth daily.     cyclobenzaprine  (FLEXERIL ) 5 MG tablet Take 1 tablet (5 mg total) by mouth 3 (three) times daily as needed for muscle spasms. 60 tablet 5   DULoxetine  (CYMBALTA ) 60 MG capsule Take 1 capsule (60 mg total) by mouth daily. 90 capsule 3   famotidine  (PEPCID ) 20 MG tablet Take 1 tablet (20 mg total) by mouth daily. 30 tablet 0   fluticasone  (FLONASE ) 50 MCG/ACT nasal spray Place 2 sprays into both nostrils daily. 48 g 3   gabapentin  (NEURONTIN ) 100 MG capsule Take 1-3 capsules (100-300 mg total) by mouth at bedtime as needed. 100 capsule 2   hydroxypropyl methylcellulose / hypromellose (ISOPTO  TEARS / GONIOVISC) 2.5 % ophthalmic solution Place 1 drop into both eyes as needed for dry eyes.     Deborah Barton  (ATIVAN ) 1 MG tablet Take 0.5-1 tablets (0.5-1 mg total) by mouth at bedtime. 90 tablet 1   meloxicam  (MOBIC ) 15 MG tablet Take 1 tablet (15 mg total) by mouth daily as needed for pain. 30 tablet 1   metoCLOPramide  (REGLAN ) 10 MG tablet Take 1 tablet (10 mg total) by mouth every 8 (eight) hours as needed for up to 12 doses for nausea. 12 tablet 0   Multiple Vitamin (MULTIVITAMIN WITH MINERALS) TABS tablet Take 1 tablet by mouth daily. Adult 50+ vit.     ondansetron  (ZOFRAN ) 4 MG tablet Take 1 tablet (4 mg total) by mouth every 6 (six) hours. 12 tablet 0   pantoprazole  (PROTONIX ) 40 MG tablet Take 1 tablet (40 mg total) by mouth daily. 90 tablet 3   vitamin B-12 (CYANOCOBALAMIN ) 500 MCG tablet  Take 500 mcg by mouth daily.     benzonatate  (TESSALON ) 100 MG capsule Take 1 capsule (100 mg total) by mouth 2 (two) times daily as needed for cough. (Patient not taking: Reported on 07/26/2024) 20 capsule 0   sucralfate  (CARAFATE ) 1 g tablet Take 1 tablet (1 g total) by mouth 4 (four) times daily -  with meals and at bedtime for 14 days. (Patient not taking: Reported on 07/26/2024) 56 tablet 0   No current facility-administered medications on file prior to visit.        ROS:  All others reviewed and negative.  Objective        PE:  BP (!) 152/90   Pulse 79   Temp 97.9 F (36.6 C)   Ht 5' 1 (1.549 m)   Wt 127 lb 9.6 oz (57.9 kg)   SpO2 97%   BMI 24.11 kg/m                 Constitutional: Pt appears in NAD               HENT: Head: NCAT.                Right Ear: External ear normal.                 Left Ear: External ear normal.                Eyes: . Pupils are equal, round, and reactive to light. Conjunctivae and EOM are normal               Nose: without d/c or deformity               Neck: Neck supple. Gross normal ROM               Cardiovascular: Normal rate and regular rhythm.                 Pulmonary/Chest: Effort normal and breath sounds without rales or wheezing.                Abd:  Soft, NT, ND, + BS, no organomegaly               Neurological: Pt is alert. At baseline orientation, motor grossly intact, cn 2-12 intact               Skin: Skin is warm. No rashes, no other new lesions, LE edema - none  Psychiatric: Pt behavior is normal without agitation   Micro: none  Cardiac tracings I have personally interpreted today:  none  Pertinent Radiological findings (summarize): none   Lab Results  Component Value Date   WBC 8.8 07/23/2024   HGB 11.0 (L) 07/23/2024   HCT 35.4 (L) 07/23/2024   PLT 277 07/23/2024   GLUCOSE 110 (H) 07/23/2024   CHOL 222 (H) 12/12/2023   TRIG 110.0 12/12/2023   HDL 63.50 12/12/2023   LDLDIRECT 139.4 12/10/2010    LDLCALC 136 (H) 12/12/2023   ALT 23 04/04/2024   AST 26 04/04/2024   NA 134 (L) 07/23/2024   K 4.1 07/23/2024   CL 100 07/23/2024   CREATININE 0.85 07/23/2024   BUN 14 07/23/2024   CO2 25 07/23/2024   TSH 1.01 12/12/2023   HGBA1C 5.9 12/12/2023   Assessment/Plan:  Deborah Barton is a 78 y.o. Black or African American [2] female with  has a past medical history of Acid reflux, Anemia, Anxiety, Arthritis, Cataract, Hypertension, and Neuropathy.  Essential hypertension New onset, pt to continue amlodipine  as has not yet time to see effect, f/u PCP in 3 wks  Generalized anxiety disorder Pt reassured, ok to continue Deborah Barton  1 mg at bedtime prn, should have 1 refill according to records  Migraine headache Recurring with stable frequency, for restart maxalt prn  Followup: Return if symptoms worsen or fail to improve.  Lynwood Rush, MD 07/26/2024 8:36 PM [facility]

## 2024-07-26 NOTE — Telephone Encounter (Signed)
 Spoke with patient, Tx for BV on 05/23/24, reports external vulvar irritation after using metrogel , was instructed to use hydrocortisone cream. Reports continued irritation and moisture. Denies itching, bleeding, odor or urinary symptoms. Requesting earlier OV.   OV scheduled for 8/1 at 1500 with Jami. 8/14 OV cancelled.   Routing to provider for final review. Patient is agreeable to disposition. Will close encounter.

## 2024-07-26 NOTE — Patient Instructions (Addendum)
 Please continue all other medications as before, including the new amlodipine  5 mg  Please have the pharmacy call with any other refills you may need.  Please continue your efforts at being more active, low cholesterol diet, and weight control.  Please keep your appointments with your specialists as you may have planned  Please see Dr Rollene in 3 wks

## 2024-07-26 NOTE — Assessment & Plan Note (Signed)
 Recurring with stable frequency, for restart maxalt prn

## 2024-07-26 NOTE — Assessment & Plan Note (Signed)
 Pt reassured, ok to continue lorazepam  1 mg at bedtime prn, should have 1 refill according to records

## 2024-07-26 NOTE — Telephone Encounter (Signed)
**Note De-identified  [place] Obfuscation** Please advise 

## 2024-07-26 NOTE — Assessment & Plan Note (Signed)
 New onset, pt to continue amlodipine  as has not yet time to see effect, f/u PCP in 3 wks

## 2024-07-27 ENCOUNTER — Encounter: Payer: Self-pay | Admitting: Radiology

## 2024-07-27 ENCOUNTER — Ambulatory Visit (INDEPENDENT_AMBULATORY_CARE_PROVIDER_SITE_OTHER): Admitting: Radiology

## 2024-07-27 VITALS — BP 112/72 | HR 90 | Wt 126.8 lb

## 2024-07-27 DIAGNOSIS — N952 Postmenopausal atrophic vaginitis: Secondary | ICD-10-CM | POA: Diagnosis not present

## 2024-07-27 DIAGNOSIS — N761 Subacute and chronic vaginitis: Secondary | ICD-10-CM

## 2024-07-27 DIAGNOSIS — N766 Ulceration of vulva: Secondary | ICD-10-CM | POA: Diagnosis not present

## 2024-07-27 LAB — WET PREP FOR TRICH, YEAST, CLUE

## 2024-07-27 MED ORDER — ESTRADIOL 0.1 MG/GM VA CREA: 1.0000 g | TOPICAL_CREAM | VAGINAL | 3 refills | Status: AC

## 2024-07-27 NOTE — Progress Notes (Signed)
      Subjective: Deborah Barton is a 78 y.o. female who complains of Complain of persistent vulvar irritation, discharge after using Metrogel  for BV 05/23/24. Hydrocortisone cream helped, then symptoms returned.   Review of Systems  All other systems reviewed and are negative.   Past Medical History:  Diagnosis Date   Acid reflux    hx of   Anemia    hx of-   Anxiety    PRN meds   Arthritis    RIGHT knee   Cataract    bilateral sx   Hypertension    situational - not taking med   Neuropathy       Objective:  Today's Vitals   07/27/24 1401  BP: 112/72  Pulse: 90  SpO2: 98%  Weight: 126 lb 12.8 oz (57.5 kg)   Body mass index is 23.96 kg/m.   Physical Exam Constitutional:      Appearance: Normal appearance. She is normal weight.  Pulmonary:     Effort: Pulmonary effort is normal.  Genitourinary:    Labia:        Right: Lesion present.       Comments: 6mm ulceration lower right labia Neurological:     Mental Status: She is alert.  Psychiatric:        Mood and Affect: Mood normal.        Thought Content: Thought content normal.        Judgment: Judgment normal.     Microscopic wet-mount exam shows negative for pathogens, normal epithelial cells.   Darice Hoit, CMA present for exam  Assessment:/Plan:   1. Chronic vaginitis (Primary) - WET PREP FOR TRICH, YEAST, CLUE  2. Vulvar ulceration - SureSwab HSV, Type 1/2 DNA, PCR  3. Atrophic vulvovaginitis - estradiol (ESTRACE VAGINAL) 0.1 MG/GM vaginal cream; Place 1 g vaginally 3 (three) times a week. Along with a thin layer to vulva  Dispense: 42.5 g; Refill: 3   Will contact patient with results of testing completed today. Avoid the use of soaps or perfumed products in the peri area. Avoid tub baths and sitting in sweaty or wet clothing for prolonged periods of time.     Vilda Zollner B, NP 2:19 PM

## 2024-07-30 ENCOUNTER — Inpatient Hospital Stay: Admitting: Internal Medicine

## 2024-07-31 ENCOUNTER — Ambulatory Visit: Payer: Self-pay | Admitting: Radiology

## 2024-07-31 LAB — SURESWAB HSV, TYPE 1/2 DNA, PCR
HSV 1 DNA: NOT DETECTED
HSV 2 DNA: DETECTED — AB

## 2024-07-31 MED ORDER — VALACYCLOVIR HCL 1 G PO TABS: 1000.0000 mg | ORAL_TABLET | Freq: Two times a day (BID) | ORAL | 0 refills | Status: AC

## 2024-08-06 ENCOUNTER — Encounter: Admitting: Nurse Practitioner

## 2024-08-07 DIAGNOSIS — H43813 Vitreous degeneration, bilateral: Secondary | ICD-10-CM | POA: Diagnosis not present

## 2024-08-09 ENCOUNTER — Ambulatory Visit: Admitting: Nurse Practitioner

## 2024-08-09 ENCOUNTER — Ambulatory Visit: Admitting: Radiology

## 2024-08-09 ENCOUNTER — Encounter: Payer: Self-pay | Admitting: Nurse Practitioner

## 2024-08-09 VITALS — BP 110/68 | HR 92 | Wt 127.0 lb

## 2024-08-09 DIAGNOSIS — B009 Herpesviral infection, unspecified: Secondary | ICD-10-CM

## 2024-08-09 DIAGNOSIS — N9089 Other specified noninflammatory disorders of vulva and perineum: Secondary | ICD-10-CM | POA: Diagnosis not present

## 2024-08-09 NOTE — Progress Notes (Signed)
   Acute Office Visit  Subjective:    Patient ID: Deborah Barton, female    DOB: 1946-12-12, 78 y.o.   MRN: [id number]   HPI 78 y.o. presents today to discuss recent HSV diagnosis. Seen by Shasta BROCKS, NP 07/27/24 for vulvar lesions. HSV 2+. Treated with Valtrex . Lesions are gone. Did not tolerate Valtrex , caused headaches. Patient concerned and has questions. Has had boyfriend for 2 years, was married for 20 years prior to this. Wondering where she got if from. Current partner denies ever having outbreak. Has had high level of stress due to son being sick and also had Covid just prior to [place]. Requesting serum testing today.   No LMP recorded. Patient has had a hysterectomy.    Review of Systems  Constitutional: Negative.   Genitourinary:  Negative for genital sores.       Objective:    Physical Exam Constitutional:      Appearance: Normal appearance.   GU: Not indicated  BP 110/68   Pulse 92   Wt 127 lb (57.6 kg)   SpO2 99%   BMI 24.00 kg/m  Wt Readings from Last 3 Encounters:  08/09/24 127 lb (57.6 kg)  07/27/24 126 lb 12.8 oz (57.5 kg)  07/26/24 127 lb 9.6 oz (57.9 kg)       Assessment & Plan:   Problem List Items Addressed This Visit   None Visit Diagnoses       Vulvar lesion    -  Primary   Relevant Orders   HSV(herpes simplex vrs) 1+2 ab-IgG     HSV-2 (herpes simplex virus 2) infection          Plan: Discussed accuracy of culture and positive result, serum testing not needed but still wants it done. Discussed transmission, dormancy and management options. Aware not able to identify where/when she contacted this as it can remain dormant for years. Recent stress and illness likely cause for outbreak. Recommend stress management. Did not tolerate Valtex. Can try topical treatments or other oral antiviral in the future if needed.    Return if symptoms worsen or fail to improve.    Deborah DELENA Shutter DNP, 11:03 AM 08/09/2024

## 2024-08-10 LAB — HSV(HERPES SIMPLEX VRS) I + II AB-IGG
HSV 1 IGG,TYPE SPECIFIC AB: 43.9 {index} — ABNORMAL HIGH
HSV 2 IGG,TYPE SPECIFIC AB: 2.58 {index} — ABNORMAL HIGH

## 2024-08-14 ENCOUNTER — Ambulatory Visit: Payer: Self-pay | Admitting: Nurse Practitioner

## 2024-08-15 NOTE — Telephone Encounter (Signed)
 Patient left message stating no results received from last weeks OV.   Call returned to patient, left detailed message, ok per dpr. Advised of results per TW on 08/14/24. Advised to return call to office if any additional questions.   Routing FYI.   Encounter closed.

## 2024-08-17 ENCOUNTER — Ambulatory Visit (INDEPENDENT_AMBULATORY_CARE_PROVIDER_SITE_OTHER): Admitting: Internal Medicine

## 2024-08-17 ENCOUNTER — Encounter: Payer: Self-pay | Admitting: Internal Medicine

## 2024-08-17 VITALS — BP 124/82 | HR 88 | Temp 98.4°F | Ht 61.0 in | Wt 128.0 lb

## 2024-08-17 DIAGNOSIS — F411 Generalized anxiety disorder: Secondary | ICD-10-CM | POA: Diagnosis not present

## 2024-08-17 DIAGNOSIS — I10 Essential (primary) hypertension: Secondary | ICD-10-CM | POA: Diagnosis not present

## 2024-08-17 DIAGNOSIS — G609 Hereditary and idiopathic neuropathy, unspecified: Secondary | ICD-10-CM

## 2024-08-17 DIAGNOSIS — G43809 Other migraine, not intractable, without status migrainosus: Secondary | ICD-10-CM | POA: Diagnosis not present

## 2024-08-17 DIAGNOSIS — U071 COVID-19: Secondary | ICD-10-CM

## 2024-08-17 MED ORDER — GABAPENTIN 100 MG PO CAPS: 100.0000 mg | ORAL_CAPSULE | Freq: Every evening | ORAL | 2 refills | Status: DC | PRN

## 2024-08-17 MED ORDER — LORAZEPAM 1 MG PO TABS: 0.5000 mg | ORAL_TABLET | Freq: Every day | ORAL | 1 refills | Status: DC

## 2024-08-17 MED ORDER — DULOXETINE HCL 60 MG PO CPEP: 60.0000 mg | ORAL_CAPSULE | Freq: Every day | ORAL | 3 refills | Status: AC

## 2024-08-17 MED ORDER — AMLODIPINE BESYLATE 5 MG PO TABS: 5.0000 mg | ORAL_TABLET | Freq: Every day | ORAL | 1 refills | Status: DC

## 2024-08-17 MED ORDER — LORAZEPAM 1 MG PO TABS
0.5000 mg | ORAL_TABLET | Freq: Every day | ORAL | 1 refills | Status: AC
Start: 2024-08-17 — End: ?

## 2024-08-17 NOTE — Patient Instructions (Addendum)
 We will keep you on the amlodipine  for awhile. If we want we can try you off this in the futurevf

## 2024-08-17 NOTE — Assessment & Plan Note (Signed)
 She experiences a head burning sensation, likely neuropathic. Gabapentin  is prescribed for nerve pain without causing sedation. Use gabapentin  as needed and continue lecithin once daily.

## 2024-08-17 NOTE — Assessment & Plan Note (Signed)
 Resolved but could have been related to high BP readings.

## 2024-08-17 NOTE — Progress Notes (Signed)
 Subjective:   Patient ID: Deborah Barton, female    DOB: 14-Dec-1946, 78 y.o.   MRN: [id number]  Discussed the use of AI scribe software for clinical note transcription with the patient, who gave verbal consent to proceed.  History of Present Illness Deborah Barton is a 78 year old female with hypertension who presents for blood pressure management and medication review.  She was recently seen in the emergency room with a blood pressure reading of 192/97 mmHg. She was started on a low dose of amlodipine  after her emergency room visit and reports that her blood pressure has come down, though she experiences occasional dizziness, which she attributes to the medication.  She has a history of migraines, which she associates with a previous COVID-19 infection. She has been taking duloxetine  for the migraines, but it has not been as effective recently. She also uses gabapentin  for head burning sensations, which she takes as needed without experiencing side effects like sedation.  She takes lorazepam  to aid sleep, especially during stressful periods, and finds it effective. She is currently under significant stress due to personal issues and concerns about her son's potential diagnosis of multiple sclerosis.  She is trying to maintain her weight under 130 pounds to alleviate knee pain, noting that weight gain exacerbates her knee discomfort.  Review of Systems  Constitutional: Negative.   HENT: Negative.    Eyes: Negative.   Respiratory:  Negative for cough, chest tightness and shortness of breath.   Cardiovascular:  Negative for chest pain, palpitations and leg swelling.  Gastrointestinal:  Negative for abdominal distention, abdominal pain, constipation, diarrhea, nausea and vomiting.  Musculoskeletal: Negative.   Skin: Negative.   Neurological: Negative.   Psychiatric/Behavioral: Negative.      Objective:  Physical Exam Constitutional:      Appearance: She is  well-developed.  HENT:     Head: Normocephalic and atraumatic.  Cardiovascular:     Rate and Rhythm: Normal rate and regular rhythm.  Pulmonary:     Effort: Pulmonary effort is normal. No respiratory distress.     Breath sounds: Normal breath sounds. No wheezing or rales.  Abdominal:     General: Bowel sounds are normal. There is no distension.     Palpations: Abdomen is soft.     Tenderness: There is no abdominal tenderness. There is no rebound.  Musculoskeletal:     Cervical back: Normal range of motion.  Skin:    General: Skin is warm and dry.  Neurological:     Mental Status: She is alert and oriented to person, place, and time.     Coordination: Coordination normal.     Vitals:   08/17/24 1054  BP: 124/82  Pulse: 88  Temp: 98.4 F (36.9 C)  TempSrc: Oral  SpO2: 98%  Weight: 128 lb (58.1 kg)  Height: 5' 1 (1.549 m)    Assessment and Plan Assessment & Plan Hypertension   Blood pressure was elevated during the ER visit but is now well-controlled, likely due to stress and recent COVID-19 infection. No long-standing hypertension signs on EKG. Discussed amlodipine  side effects. Continue amlodipine  and monitor blood pressure regularly. Consider discontinuing amlodipine  if blood pressure remains controlled after stressors subside.  Head burning sensation (neuropathic pain)   She experiences a head burning sensation, likely neuropathic. Gabapentin  is prescribed for nerve pain without causing sedation. Use gabapentin  as needed and continue lecithin once daily.  Anxiety   She experiences anxiety, possibly due to life stressors. Lorazepam  is effective  for sleep and anxiety management, and she prefers it for stressful events. Continue cymbalta  and lorazepam  as needed and refill the prescription.  Migraine   Migraine headaches may be worsened by the recent COVID-19 infection. Duloxetine  is currently less effective. Continue duloxetine  for migraine management.

## 2024-08-17 NOTE — Assessment & Plan Note (Signed)
 Migraine headaches may be worsened by the recent COVID-19 infection. Duloxetine  is currently less effective. Continue duloxetine  for migraine management.

## 2024-08-17 NOTE — Assessment & Plan Note (Signed)
 She experiences anxiety, possibly due to life stressors. Lorazepam  is effective for sleep and anxiety management, and she prefers it for stressful events. Continue cymbalta  and lorazepam  as needed and refill the prescription.

## 2024-08-17 NOTE — Assessment & Plan Note (Signed)
 Blood pressure was elevated during the ER visit but is now well-controlled, likely due to stress and recent COVID-19 infection. No long-standing hypertension signs on EKG. Discussed amlodipine  side effects. Continue amlodipine  and monitor blood pressure regularly. Consider discontinuing amlodipine  if blood pressure remains controlled after stressors subside.

## 2024-09-03 ENCOUNTER — Telehealth: Payer: Self-pay

## 2024-09-03 NOTE — Telephone Encounter (Signed)
 Copied from CRM [id number]. Topic: General - Other >> Sep 03, 2024  2:21 PM Burnard DEL wrote: Reason for CRM: Patient would like to know if she could have prescription for moderna covid vaccine sent to pharmacy?   [facility] [address] ([address] [place] - [address]  Phone: [phone] Fax: [phone]

## 2024-09-04 MED ORDER — COVID-19 MRNA VACC (MODERNA) 50 MCG/0.5ML IM SUSP: 0.5000 mL | Freq: Once | INTRAMUSCULAR | 0 refills | Status: AC

## 2024-09-04 NOTE — Telephone Encounter (Signed)
 Called patient and informed her of this.

## 2024-09-04 NOTE — Telephone Encounter (Signed)
 Sent in

## 2024-10-03 ENCOUNTER — Ambulatory Visit: Payer: Self-pay

## 2024-10-03 NOTE — Telephone Encounter (Signed)
**Note De-identified  [place] Obfuscation** Please advise 

## 2024-10-03 NOTE — Telephone Encounter (Signed)
 FYI Only or Action Required?: FYI only for provider.  Patient was last seen in primary care on 08/17/2024 by Rollene Almarie LABOR, MD.  Called Nurse Triage reporting Insect Bite.  Symptoms began several days ago.  Interventions attempted: Ice/heat application.  Symptoms are: gradually improving.  Triage Disposition: Home Care  Patient/caregiver understands and will follow disposition?: Yes       Copied from CRM [id number]. Topic: Clinical - Red Word Triage >> Oct 03, 2024 11:37 AM Drema MATSU wrote: Red Word that prompted transfer to Nurse Triage: Patient thinks something bit her on her side. She stated that she felt it and it stung. She said that it a dark black bruise/knot under bite. Reason for Disposition  All other insect bites  Answer Assessment - Initial Assessment Questions Felt something bite her, has been putting ice on it. It's getting better, she said her son told her to call to make sure she was ok.   1. TYPE of INSECT: What type of insect was it?      unsure 2. ONSET: When did you get bitten?      Monday night 3. LOCATION: Where is the insect bite located?      Left side by waist- about the size 2 quarters put together 4. REDNESS: Is the area red or pink? If Yes, ask: What size is the area of redness? (inches or cm). When did the redness start?     Purplish color 5. PAIN: Is there any pain? If Yes, ask: How bad is the pain? (Scale 0-10; or none, mild, moderate, severe)     No, sore to touch initially 6. ITCHING: Does it itch? If Yes, ask: How bad is the itch?      No  7. SWELLING: How big is the swelling? (e.g., inches, cm, or compare to coins)     Lump there no swelling 8. OTHER SYMPTOMS: Do you have any other symptoms?  (e.g., difficulty breathing, fever, hives)     No  Protocols used: Insect Bite-A-AH

## 2024-10-16 ENCOUNTER — Ambulatory Visit: Payer: Medicare Other

## 2024-10-16 VITALS — Ht 61.0 in | Wt 128.0 lb

## 2024-10-16 DIAGNOSIS — Z0000 Encounter for general adult medical examination without abnormal findings: Secondary | ICD-10-CM | POA: Diagnosis not present

## 2024-10-16 NOTE — Progress Notes (Signed)
 Subjective:   Deborah Barton is a 78 y.o. who presents for a Medicare Wellness preventive visit.  As a reminder, Annual Wellness Visits don't include a physical exam, and some assessments may be limited, especially if this visit is performed virtually. We may recommend an in-person follow-up visit with your provider if needed.  Visit Complete: Virtual I connected with  Deborah Barton on 10/16/24 by a audio enabled telemedicine application and verified that I am speaking with the correct person using two identifiers.  Patient Location: Home  Provider Location: Office/Clinic  I discussed the limitations of evaluation and management by telemedicine. The patient expressed understanding and agreed to proceed.  Vital Signs: Because this visit was a virtual/telehealth visit, some criteria may be missing or patient reported. Any vitals not documented were not able to be obtained and vitals that have been documented are patient reported.  VideoDeclined- This patient declined Librarian, academic. Therefore the visit was completed with audio only.  Persons Participating in Visit: Patient.  AWV Questionnaire: No: Patient Medicare AWV questionnaire was not completed prior to this visit.  Cardiac Risk Factors include: advanced age (>44men, >64 women);hypertension     Objective:    Today's Vitals   10/16/24 1432  Weight: 128 lb (58.1 kg)  Height: 5' 1 (1.549 m)   Body mass index is 24.19 kg/m.     10/16/2024    2:32 PM 07/23/2024    5:37 PM 04/10/2024    2:45 PM 04/04/2024    5:48 AM 10/10/2023    3:26 PM 12/08/2022   10:17 AM 12/22/2019    3:56 AM  Advanced Directives  Does Patient Have a Medical Advance Directive? No No No No No No No  Would patient like information on creating a medical advance directive? Yes (MAU/Ambulatory/Procedural Areas - Information given) No - Patient declined  No - Patient declined  No - Patient declined      Current Medications (verified) Outpatient Encounter Medications as of 10/16/2024  Medication Sig   amLODipine  (NORVASC ) 5 MG tablet Take 1 tablet (5 mg total) by mouth daily.   Ascorbic Acid (VITAMIN C) 1000 MG tablet Take 1,000 mg by mouth daily.   aspirin EC 81 MG tablet Take by mouth.   cholecalciferol (VITAMIN D3) 25 MCG (1000 UT) tablet Take 1,000 Units by mouth daily.   cyclobenzaprine  (FLEXERIL ) 5 MG tablet Take 1 tablet (5 mg total) by mouth 3 (three) times daily as needed for muscle spasms.   DULoxetine  (CYMBALTA ) 60 MG capsule Take 1 capsule (60 mg total) by mouth daily.   estradiol  (ESTRACE  VAGINAL) 0.1 MG/GM vaginal cream Place 1 g vaginally 3 (three) times a week. Along with a thin layer to vulva   famotidine  (PEPCID ) 20 MG tablet Take 1 tablet (20 mg total) by mouth daily.   gabapentin  (NEURONTIN ) 100 MG capsule Take 1-3 capsules (100-300 mg total) by mouth at bedtime as needed.   hydroxypropyl methylcellulose / hypromellose (ISOPTO TEARS / GONIOVISC) 2.5 % ophthalmic solution Place 1 drop into both eyes as needed for dry eyes.   LORazepam  (ATIVAN ) 1 MG tablet Take 0.5-1 tablets (0.5-1 mg total) by mouth at bedtime.   meloxicam  (MOBIC ) 15 MG tablet Take 1 tablet (15 mg total) by mouth daily as needed for pain.   metoCLOPramide  (REGLAN ) 10 MG tablet Take 1 tablet (10 mg total) by mouth every 8 (eight) hours as needed for up to 12 doses for nausea.   Multiple Vitamin (MULTIVITAMIN WITH MINERALS)  TABS tablet Take 1 tablet by mouth daily. Adult 50+ vit.   ondansetron  (ZOFRAN ) 4 MG tablet Take 1 tablet (4 mg total) by mouth every 6 (six) hours.   pantoprazole  (PROTONIX ) 40 MG tablet Take 1 tablet (40 mg total) by mouth daily.   vitamin B-12 (CYANOCOBALAMIN ) 500 MCG tablet Take 500 mcg by mouth daily.   sucralfate  (CARAFATE ) 1 g tablet Take 1 tablet (1 g total) by mouth 4 (four) times daily -  with meals and at bedtime for 14 days. (Patient not taking: Reported on 10/16/2024)   No  facility-administered encounter medications on file as of 10/16/2024.    Allergies (verified) Patient has no known allergies.   History: Past Medical History:  Diagnosis Date   Acid reflux    hx of   Anemia    hx of-   Anxiety    PRN meds   Arthritis    RIGHT knee   Cataract    bilateral sx   Hypertension    situational - not taking med   Neuropathy    Past Surgical History:  Procedure Laterality Date   ABDOMINAL HYSTERECTOMY  1994   CHOLECYSTECTOMY  1975   Family History  Problem Relation Age of Onset   Anemia Other    Arthritis Other    Asthma Other    Depression Other    Hypertension Other    Stroke Other    Uterine cancer Sister 64   Ovarian cancer Maternal Aunt 60   Ovarian cancer Maternal Grandmother 24   Breast cancer Paternal Grandmother    Colon cancer Neg Hx    Esophageal cancer Neg Hx    Stomach cancer Neg Hx    Rectal cancer Neg Hx    Colon polyps Neg Hx    Social History   Socioeconomic History   Marital status: Divorced    Spouse name: Not on file   Number of children: 2   Years of education: Not on file   Highest education level: Not on file  Occupational History   Occupation: Retired  Tobacco Use   Smoking status: Never   Smokeless tobacco: Never  Vaping Use   Vaping status: Never Used  Substance and Sexual Activity   Alcohol use: Yes    Alcohol/week: 4.0 standard drinks of alcohol    Types: 1 Glasses of wine, 3 Standard drinks or equivalent per week    Comment: occasionally   Drug use: No   Sexual activity: Yes    Partners: Male    Birth control/protection: Surgical    Comment: 1st intercourse- 18, partners- 6, current partner- 20 yrs , hyst  Other Topics Concern   Not on file  Social History Narrative   Younger son stays with her and she has a Nurse, mental health.   Social Drivers of Corporate investment banker Strain: Low Risk  (10/16/2024)   Overall Financial Resource Strain (CARDIA)    Difficulty of Paying Living Expenses: Not hard  at all  Food Insecurity: No Food Insecurity (10/16/2024)   Hunger Vital Sign    Worried About Running Out of Food in the Last Year: Never true    Ran Out of Food in the Last Year: Never true  Transportation Needs: No Transportation Needs (10/16/2024)   PRAPARE - Administrator, Civil Service (Medical): No    Lack of Transportation (Non-Medical): No  Physical Activity: Sufficiently Active (10/16/2024)   Exercise Vital Sign    Days of Exercise per Week: 3 days  Minutes of Exercise per Session: 60 min  Stress: No Stress Concern Present (10/16/2024)   [employer] of Occupational Health - Occupational Stress Questionnaire    Feeling of Stress: Not at all  Social Connections: Moderately Isolated (10/16/2024)   Social Connection and Isolation Panel    Frequency of Communication with Friends and Family: More than three times a week    Frequency of Social Gatherings with Friends and Family: Once a week    Attends Religious Services: More than 4 times per year    Active Member of [employer] or Organizations: No    Attends Engineer, structural: Never    Marital Status: Divorced    Tobacco Counseling Counseling given: Not Answered    Clinical Intake:  Pre-visit preparation completed: Yes  Pain : No/denies pain     BMI - recorded: 24.19 Nutritional Status: BMI of 19-24  Normal Nutritional Risks: None Diabetes: No  Lab Results  Component Value Date   HGBA1C 5.9 12/12/2023   HGBA1C 5.8 12/04/2019     How often do you need to have someone help you when you read instructions, pamphlets, or other written materials from your doctor or pharmacy?: 1 - Never  Interpreter Needed?: No  Information entered by :: Verdie Saba, CMA   Activities of Daily Living     10/16/2024    2:36 PM  In your present state of health, do you have any difficulty performing the following activities:  Hearing? 0  Vision? 0  Difficulty concentrating or making decisions? 0   Walking or climbing stairs? 0  Dressing or bathing? 0  Doing errands, shopping? 0  Preparing Food and eating ? N  Using the Toilet? N  In the past six months, have you accidently leaked urine? N  Do you have problems with loss of bowel control? N  Managing your Medications? N  Managing your Finances? N  Housekeeping or managing your Housekeeping? N    Patient Care Team: Rollene Almarie LABOR, MD as PCP - General (Internal Medicine) Szabat, Toribio BROCKS, [facility] (Inactive) as Pharmacist (Pharmacist) Cleatus Collar, MD as Consulting Physician (Ophthalmology)  I have updated your Care Teams any recent Medical Services you may have received from other providers in the past year.     Assessment:   This is a routine wellness examination for Keyira.  Hearing/Vision screen Hearing Screening - Comments:: Denies hearing difficulties   Vision Screening - Comments:: Denies vision concerns - sees Collar Cleatus   Goals Addressed               This Visit's Progress     Patient Stated (pt-stated)        Patient stated she plans to continue to walk/exercising more at the gym       Depression Screen     10/16/2024    2:38 PM 08/17/2024   11:08 AM 10/10/2023    3:37 PM 03/01/2023    2:27 PM 12/08/2022   10:02 AM 07/06/2022    2:03 PM 12/07/2021   11:02 AM  PHQ 2/9 Scores  PHQ - 2 Score 1 0 3 0 0 0 0  PHQ- 9 Score 1  6 0  0     Fall Risk     10/16/2024    2:37 PM 08/17/2024   11:08 AM 05/23/2024   10:47 AM 04/09/2024   11:20 AM 12/12/2023    1:14 PM  Fall Risk   Falls in the past year? 0 0 0 0 0  Number falls in past yr: 0 0 0 0 0  Injury with Fall? 0 0 0 0 0  Risk for fall due to : No Fall Risks  No Fall Risks    Follow up Falls evaluation completed;Falls prevention discussed Falls evaluation completed Falls evaluation completed Falls evaluation completed Falls evaluation completed    MEDICARE RISK AT HOME:  Medicare Risk at Home Any stairs in or around the home?: No If  so, are there any without handrails?: No Home free of loose throw rugs in walkways, pet beds, electrical cords, etc?: Yes Adequate lighting in your home to reduce risk of falls?: Yes Life alert?: No Use of a cane, walker or w/c?: No Grab bars in the bathroom?: No Shower chair or bench in shower?: No Elevated toilet seat or a handicapped toilet?: No  TIMED UP AND GO:  Was the test performed?  No  Cognitive Function: 6CIT completed        10/16/2024    2:40 PM 10/10/2023    3:31 PM 12/08/2022   10:03 AM  6CIT Screen  What Year? 0 points 0 points 0 points  What month? 0 points 0 points 0 points  What time? 0 points 0 points 0 points  Count back from 20 0 points 0 points 0 points  Months in reverse 0 points 2 points 0 points  Repeat phrase 0 points 0 points 0 points  Total Score 0 points 2 points 0 points    Immunizations Immunization History  Administered Date(s) Administered   Fluad Quad(high Dose 65+) 12/03/2020   Fluad Trivalent(High Dose 65+) 09/28/2023   INFLUENZA, HIGH DOSE SEASONAL PF 11/08/2016, 11/23/2017, 11/30/2018   Influenza Split 12/23/2011   Influenza Whole 12/17/2009, 12/16/2010   Influenza, Seasonal, Injecte, Preservative Fre 01/04/2013   Influenza,inj,Quad PF,6+ Mos 09/23/2014, 11/03/2015   Influenza-Unspecified 11/11/2019, 09/15/2022   PFIZER(Purple Top)SARS-COV-2 Vaccination 03/21/2020, 04/11/2020, 10/15/2020, 04/16/2021, 09/17/2021   Pfizer(Comirnaty)Fall Seasonal Vaccine 12 years and older 09/28/2022   Pneumococcal Conjugate-13 04/02/2014   Pneumococcal Polysaccharide-23 01/04/2013   Respiratory Syncytial Virus Vaccine,Recomb Aduvanted(Arexvy) 09/28/2022   Td 12/09/2008   Tdap 12/03/2019   Unspecified SARS-COV-2 Vaccination 04/22/2023   Zoster Recombinant(Shingrix) 03/30/2021, 06/01/2021    Screening Tests Health Maintenance  Topic Date Due   Influenza Vaccine  07/27/2024   COVID-19 Vaccine (8 - 2025-26 season) 08/27/2024   Medicare Annual  Wellness (AWV)  10/16/2025   Colonoscopy  01/30/2026   DTaP/Tdap/Td (3 - Td or Tdap) 12/02/2029   Pneumococcal Vaccine: 50+ Years  Completed   DEXA SCAN  Completed   Hepatitis C Screening  Completed   Zoster Vaccines- Shingrix  Completed   Meningococcal B Vaccine  Aged Out   Mammogram  Discontinued    Health Maintenance Items Addressed:  10/16/2024  Additional Screening:  Vision Screening: Recommended annual ophthalmology exams for early detection of glaucoma and other disorders of the eye. Is the patient up to date with their annual eye exam?  Yes  Who is the provider or what is the name of the office in which the patient attends annual eye exams? [facility]  Dental Screening: Recommended annual dental exams for proper oral hygiene  Community Resource Referral / Chronic Care Management: CRR required this visit?  No   CCM required this visit?  No   Plan:    I have personally reviewed and noted the following in the patient's chart:   Medical and social history Use of alcohol, tobacco or illicit drugs  Current medications and supplements including  opioid prescriptions. Patient is not currently taking opioid prescriptions. Functional ability and status Nutritional status Physical activity Advanced directives List of other physicians Hospitalizations, surgeries, and ER visits in previous 12 months Vitals Screenings to include cognitive, depression, and falls Referrals and appointments  In addition, I have reviewed and discussed with patient certain preventive protocols, quality metrics, and best practice recommendations. A written personalized care plan for preventive services as well as general preventive health recommendations were provided to patient.   Verdie CHRISTELLA Saba, CMA   10/16/2024   After Visit Summary: (MyChart) Due to this being a telephonic visit, the after visit summary with patients personalized plan was offered to patient via MyChart   Notes: Nothing  significant to report at this time.

## 2024-10-16 NOTE — Patient Instructions (Addendum)
 [name],  Thank you for taking the time for your Medicare Wellness Visit. I appreciate your continued commitment to your health goals. Please review the care plan we discussed, and feel free to reach out if I can assist you further.  Medicare recommends these wellness visits once per year to help you and your care team stay ahead of potential health issues. These visits are designed to focus on prevention, allowing your provider to concentrate on managing your acute and chronic conditions during your regular appointments.  Please note that Annual Wellness Visits do not include a physical exam. Some assessments may be limited, especially if the visit was conducted virtually. If needed, we may recommend a separate in-person follow-up with your provider.  Ongoing Care Seeing your primary care provider every 3 to 6 months helps us  monitor your health and provide consistent, personalized care.   Referrals If a referral was made during today's visit and you haven't received any updates within two weeks, please contact the referred provider directly to check on the status.  Recommended Screenings:  Health Maintenance  Topic Date Due   Flu Shot  07/27/2024   COVID-19 Vaccine (8 - 2025-26 season) 08/27/2024   Medicare Annual Wellness Visit  10/16/2025   Colon Cancer Screening  01/30/2026   DTaP/Tdap/Td vaccine (3 - Td or Tdap) 12/02/2029   Pneumococcal Vaccine for age over 85  Completed   DEXA scan (bone density measurement)  Completed   Hepatitis C Screening  Completed   Zoster (Shingles) Vaccine  Completed   Meningitis B Vaccine  Aged Out   Breast Cancer Screening  Discontinued       10/16/2024    2:32 PM  Advanced Directives  Does Patient Have a Medical Advance Directive? No  Would patient like information on creating a medical advance directive? Yes (MAU/Ambulatory/Procedural Areas - Information given)   Advance Care Planning is important because it: Ensures you receive medical  care that aligns with your values, goals, and preferences. Provides guidance to your family and loved ones, reducing the emotional burden of decision-making during critical moments.  Vision: Annual vision screenings are recommended for early detection of glaucoma, cataracts, and diabetic retinopathy. These exams can also reveal signs of chronic conditions such as diabetes and high blood pressure.  Dental: Annual dental screenings help detect early signs of oral cancer, gum disease, and other conditions linked to overall health, including heart disease and diabetes.

## 2024-10-25 ENCOUNTER — Other Ambulatory Visit: Payer: Self-pay | Admitting: Internal Medicine

## 2024-11-08 LAB — OPHTHALMOLOGY REPORT-SCANNED

## 2024-12-05 ENCOUNTER — Ambulatory Visit: Admitting: Diagnostic Neuroimaging

## 2024-12-05 ENCOUNTER — Encounter: Payer: Self-pay | Admitting: Diagnostic Neuroimaging

## 2024-12-05 VITALS — BP 133/76 | HR 88 | Ht 61.0 in | Wt 125.8 lb

## 2024-12-05 DIAGNOSIS — R208 Other disturbances of skin sensation: Secondary | ICD-10-CM | POA: Diagnosis not present

## 2024-12-05 NOTE — Progress Notes (Signed)
 [facility]  PATIENT: Deborah Barton DOB: [birth year]  REFERRING CLINICIAN: Rollene Almarie LABOR, * HISTORY FROM: patient  REASON FOR VISIT: new consult   HISTORICAL  CHIEF COMPLAINT:  Chief Complaint  Patient presents with   RM 7     Patient is here for migraines - was sick 2-3 months ago (07/23/24) with COVID and her BP was over 200 for the top number. Was taking the paxlovid  prior to BP issues. With migraines get a burning sensation on the top of her head. Triggered by stress or distress in her life.    Other    Has a knot in the back of her head she would like looked at today    HISTORY OF PRESENT ILLNESS:   78 year old female here for evaluation of burning sensation in the scalp.  2015 patient had onset of intermittent burning sensation in the scalp, typically when she was under stress or anxiety situations, with associated high blood pressure.  Saw neurologist at [facility] at that time was diagnosed with anxiety and blood pressure associated symptoms.  Was recommended to manage anxiety and blood pressure as well as use gabapentin  as needed.  Symptoms have been fairly stable over time.  In the past year patient has been under more stress.  Lately she feels increasing stress and anxiety due to new diagnosis of multiple sclerosis and her son.  Having some ongoing anxiety and high blood pressure issues with symptoms lasting hours or days at a time.  Uses gabapentin  as needed for these episodes which seems to help.   REVIEW OF SYSTEMS: Full 14 system review of systems performed and negative with exception of: as per HPI.  ALLERGIES: No Known Allergies  HOME MEDICATIONS: Outpatient Medications Prior to Visit  Medication Sig Dispense Refill   amLODipine  (NORVASC ) 5 MG tablet TAKE 1 TABLET BY MOUTH DAILY 100 tablet 2   Ascorbic Acid (VITAMIN C) 1000 MG tablet Take 1,000 mg by mouth daily.     aspirin EC 81 MG tablet Take by mouth.     cholecalciferol  (VITAMIN D3) 25 MCG (1000 UT) tablet Take 1,000 Units by mouth daily.     cyclobenzaprine  (FLEXERIL ) 5 MG tablet Take 1 tablet (5 mg total) by mouth 3 (three) times daily as needed for muscle spasms. 60 tablet 5   DULoxetine  (CYMBALTA ) 60 MG capsule Take 1 capsule (60 mg total) by mouth daily. 90 capsule 3   estradiol  (ESTRACE  VAGINAL) 0.1 MG/GM vaginal cream Place 1 g vaginally 3 (three) times a week. Along with a thin layer to vulva 42.5 g 3   famotidine  (PEPCID ) 20 MG tablet Take 1 tablet (20 mg total) by mouth daily. 30 tablet 0   gabapentin  (NEURONTIN ) 100 MG capsule TAKE 1 TO 3 CAPSULES BY MOUTH AT BEDTIME AS NEEDED 300 capsule 2   hydroxypropyl methylcellulose / hypromellose (ISOPTO TEARS / GONIOVISC) 2.5 % ophthalmic solution Place 1 drop into both eyes as needed for dry eyes.     LORazepam  (ATIVAN ) 1 MG tablet Take 0.5-1 tablets (0.5-1 mg total) by mouth at bedtime. 90 tablet 1   meloxicam  (MOBIC ) 15 MG tablet Take 1 tablet (15 mg total) by mouth daily as needed for pain. 30 tablet 1   metoCLOPramide  (REGLAN ) 10 MG tablet Take 1 tablet (10 mg total) by mouth every 8 (eight) hours as needed for up to 12 doses for nausea. 12 tablet 0   Multiple Vitamin (MULTIVITAMIN WITH MINERALS) TABS tablet Take 1 tablet by mouth  daily. Adult 50+ vit.     ondansetron  (ZOFRAN ) 4 MG tablet Take 1 tablet (4 mg total) by mouth every 6 (six) hours. 12 tablet 0   pantoprazole  (PROTONIX ) 40 MG tablet Take 1 tablet (40 mg total) by mouth daily. 90 tablet 3   vitamin B-12 (CYANOCOBALAMIN ) 500 MCG tablet Take 500 mcg by mouth daily.     sucralfate  (CARAFATE ) 1 g tablet Take 1 tablet (1 g total) by mouth 4 (four) times daily -  with meals and at bedtime for 14 days. (Patient not taking: Reported on 12/05/2024) 56 tablet 0   No facility-administered medications prior to visit.    PAST MEDICAL HISTORY: Past Medical History:  Diagnosis Date   Acid reflux    hx of   Anemia    hx of-   Anxiety    PRN meds    Arthritis    RIGHT knee   Cataract    bilateral sx   Hypertension    situational - not taking med   Neuropathy     PAST SURGICAL HISTORY: Past Surgical History:  Procedure Laterality Date   ABDOMINAL HYSTERECTOMY  1994   CHOLECYSTECTOMY  1975    FAMILY HISTORY: Family History  Problem Relation Age of Onset   Uterine cancer Sister 41   Ovarian cancer Maternal Grandmother 81   Breast cancer Paternal Grandmother    Ovarian cancer Maternal Aunt 60   Anemia Other    Arthritis Other    Asthma Other    Depression Other    Hypertension Other    Stroke Other    Multiple sclerosis Son    Colon cancer Neg Hx    Esophageal cancer Neg Hx    Stomach cancer Neg Hx    Rectal cancer Neg Hx    Colon polyps Neg Hx     SOCIAL HISTORY: Social History   Socioeconomic History   Marital status: Divorced    Spouse name: Not on file   Number of children: 2   Years of education: Not on file   Highest education level: Not on file  Occupational History   Occupation: Retired  Tobacco Use   Smoking status: Never   Smokeless tobacco: Never  Vaping Use   Vaping status: Never Used  Substance and Sexual Activity   Alcohol use: Yes    Alcohol/week: 4.0 standard drinks of alcohol    Types: 1 Glasses of wine, 3 Standard drinks or equivalent per week    Comment: occasionally   Drug use: No   Sexual activity: Yes    Partners: Male    Birth control/protection: Surgical    Comment: 1st intercourse- 18, partners- 6, current partner- 20 yrs , hyst  Other Topics Concern   Not on file  Social History Narrative   Younger son stays with her and she has a nurse, mental health.   No caffeine - decaf tea only or ginger ale with upset stomach     Social Drivers of Corporate Investment Banker Strain: Low Risk  (10/16/2024)   Overall Financial Resource Strain (CARDIA)    Difficulty of Paying Living Expenses: Not hard at all  Food Insecurity: No Food Insecurity (10/16/2024)   Hunger Vital Sign    Worried About  Running Out of Food in the Last Year: Never true    Ran Out of Food in the Last Year: Never true  Transportation Needs: No Transportation Needs (10/16/2024)   PRAPARE - Administrator, Civil Service (Medical): No  Lack of Transportation (Non-Medical): No  Physical Activity: Sufficiently Active (10/16/2024)   Exercise Vital Sign    Days of Exercise per Week: 3 days    Minutes of Exercise per Session: 60 min  Stress: No Stress Concern Present (10/16/2024)   [employer] of Occupational Health - Occupational Stress Questionnaire    Feeling of Stress: Not at all  Social Connections: Moderately Isolated (10/16/2024)   Social Connection and Isolation Panel    Frequency of Communication with Friends and Family: More than three times a week    Frequency of Social Gatherings with Friends and Family: Once a week    Attends Religious Services: More than 4 times per year    Active Member of [employer] or Organizations: No    Attends Banker Meetings: Never    Marital Status: Divorced  Catering Manager Violence: Not At Risk (10/16/2024)   Humiliation, Afraid, Rape, and Kick questionnaire    Fear of Current or Ex-Partner: No    Emotionally Abused: No    Physically Abused: No    Sexually Abused: No     PHYSICAL EXAM  GENERAL EXAM/CONSTITUTIONAL: Vitals:  Vitals:   12/05/24 1142  BP: 133/76  Pulse: 88  SpO2: 99%  Weight: 125 lb 12.8 oz (57.1 kg)  Height: 5' 1 (1.549 m)   Body mass index is 23.77 kg/m. Wt Readings from Last 3 Encounters:  12/05/24 125 lb 12.8 oz (57.1 kg)  10/16/24 128 lb (58.1 kg)  08/17/24 128 lb (58.1 kg)   Patient is in no distress; well developed, nourished and groomed; neck is supple  CARDIOVASCULAR: Examination of carotid arteries is normal; no carotid bruits Regular rate and rhythm, no murmurs Examination of peripheral vascular system by observation and palpation is normal  EYES: Ophthalmoscopic exam of optic discs and  posterior segments is normal; no papilledema or hemorrhages No results found.  MUSCULOSKELETAL: Gait, strength, tone, movements noted in Neurologic exam below  NEUROLOGIC: MENTAL STATUS:      No data to display         awake, alert, oriented to person, place and time recent and remote memory intact normal attention and concentration language fluent, comprehension intact, naming intact fund of knowledge appropriate  CRANIAL NERVE:  2nd - no papilledema on fundoscopic exam 2nd, 3rd, 4th, 6th - pupils equal and reactive to light, visual fields full to confrontation, extraocular muscles intact, no nystagmus 5th - facial sensation symmetric 7th - facial strength symmetric 8th - hearing intact 9th - palate elevates symmetrically, uvula midline 11th - shoulder shrug symmetric 12th - tongue protrusion midline  MOTOR:  normal bulk and tone, full strength in the BUE, BLE  SENSORY:  normal and symmetric to light touch, temperature, vibration  COORDINATION:  finger-nose-finger, fine finger movements normal  REFLEXES:  deep tendon reflexes 1+and symmetric  GAIT/STATION:  narrow based gait     DIAGNOSTIC DATA (LABS, IMAGING, TESTING) - I reviewed patient records, labs, notes, testing and imaging myself where available.  Lab Results  Component Value Date   WBC 8.8 07/23/2024   HGB 11.0 (L) 07/23/2024   HCT 35.4 (L) 07/23/2024   MCV 74.2 (L) 07/23/2024   PLT 277 07/23/2024      Component Value Date/Time   NA 134 (L) 07/23/2024 1932   K 4.1 07/23/2024 1932   CL 100 07/23/2024 1932   CO2 25 07/23/2024 1932   GLUCOSE 110 (H) 07/23/2024 1932   BUN 14 07/23/2024 1932   CREATININE 0.85 07/23/2024 1932  CALCIUM 9.3 07/23/2024 1932   PROT 7.0 04/04/2024 0539   ALBUMIN 4.0 04/04/2024 0539   AST 26 04/04/2024 0539   ALT 23 04/04/2024 0539   ALKPHOS 77 04/04/2024 0539   BILITOT 0.9 04/04/2024 0539   GFRNONAA >60 07/23/2024 1932   GFRAA >60 12/22/2019 0423   Lab  Results  Component Value Date   CHOL 222 (H) 12/12/2023   HDL 63.50 12/12/2023   LDLCALC 136 (H) 12/12/2023   LDLDIRECT 139.4 12/10/2010   TRIG 110.0 12/12/2023   CHOLHDL 3 12/12/2023   Lab Results  Component Value Date   HGBA1C 5.9 12/12/2023   Lab Results  Component Value Date   VITAMINB12 521 12/12/2023   Lab Results  Component Value Date   TSH 1.01 12/12/2023    12/15/23 MRI brain  1. No cerebellopontine angle or internal auditory canal mass. 2. No etiology of tinnitus identified. 3. Mild chronic small vessel ischemic changes within the cerebral white matter, progressed from the prior brain MRI of 09/26/2014. 4. Mildly low-lying cerebellar tonsils. 5. Minor left maxillary sinus mucosal thickening.  07/23/24 CT venogram - No evidence of acute intracranial abnormality or dural venous sinus thrombosis.   ASSESSMENT AND PLAN  78 y.o. year old female here with:   Dx:  1. Burning sensation     PLAN:  Intermittent scalp burning sensation (since ~2015; typically associated with anxiety and high blood pressure attacks) - neuro exam, neuroimaging are unremarkable - continue BP control; may use anxiety meds / gabapentin  as needed  Return for return to PCP, pending if symptoms worsen or fail to improve.    EDUARD FABIENE HANLON, MD 12/05/2024, 12:31 PM Certified in Neurology, Neurophysiology and Neuroimaging  [facility] [address], [address] [place], [place] [address] [phone]

## 2024-12-05 NOTE — Patient Instructions (Signed)
°  Intermittent scalp burning sensation (since ~2015) - neuro exam, neuroimaging are normal - likely related to stress, anxiety and BP - continue BP control; may use anxiety meds / gabapentin  as needed

## 2024-12-11 ENCOUNTER — Encounter: Payer: Medicare Other | Admitting: Internal Medicine

## 2024-12-12 ENCOUNTER — Ambulatory Visit (INDEPENDENT_AMBULATORY_CARE_PROVIDER_SITE_OTHER): Admitting: Internal Medicine

## 2024-12-12 ENCOUNTER — Encounter: Payer: Self-pay | Admitting: Internal Medicine

## 2024-12-12 VITALS — BP 132/80 | HR 79 | Temp 97.7°F | Ht 61.0 in | Wt 125.6 lb

## 2024-12-12 DIAGNOSIS — Z0000 Encounter for general adult medical examination without abnormal findings: Secondary | ICD-10-CM

## 2024-12-12 DIAGNOSIS — K219 Gastro-esophageal reflux disease without esophagitis: Secondary | ICD-10-CM | POA: Diagnosis not present

## 2024-12-12 DIAGNOSIS — F411 Generalized anxiety disorder: Secondary | ICD-10-CM

## 2024-12-12 DIAGNOSIS — R0789 Other chest pain: Secondary | ICD-10-CM | POA: Diagnosis not present

## 2024-12-12 DIAGNOSIS — G609 Hereditary and idiopathic neuropathy, unspecified: Secondary | ICD-10-CM

## 2024-12-12 DIAGNOSIS — R6889 Other general symptoms and signs: Secondary | ICD-10-CM

## 2024-12-12 DIAGNOSIS — E2839 Other primary ovarian failure: Secondary | ICD-10-CM

## 2024-12-12 DIAGNOSIS — I10 Essential (primary) hypertension: Secondary | ICD-10-CM

## 2024-12-12 LAB — LIPID PANEL
Cholesterol: 188 mg/dL (ref 28–200)
HDL: 66.6 mg/dL (ref >39.00)
LDL Cholesterol: 108 mg/dL — ABNORMAL HIGH (ref 10–99)
NonHDL: 121.53
Total CHOL/HDL Ratio: 3
Triglycerides: 68 mg/dL (ref 10.0–149.0)
VLDL: 13.6 mg/dL (ref 0.0–40.0)

## 2024-12-12 LAB — COMPREHENSIVE METABOLIC PANEL WITH GFR
ALT: 13 U/L (ref 3–35)
AST: 23 U/L (ref 5–37)
Albumin: 4.3 g/dL (ref 3.5–5.2)
Alkaline Phosphatase: 95 U/L (ref 39–117)
BUN: 11 mg/dL (ref 6–23)
CO2: 30 meq/L (ref 19–32)
Calcium: 9.9 mg/dL (ref 8.4–10.5)
Chloride: 103 meq/L (ref 96–112)
Creatinine, Ser: 0.73 mg/dL (ref 0.40–1.20)
GFR: 79.55 mL/min (ref >60.00)
Glucose, Bld: 53 mg/dL — ABNORMAL LOW (ref 70–99)
Potassium: 3.9 meq/L (ref 3.5–5.1)
Sodium: 139 meq/L (ref 135–145)
Total Bilirubin: 0.6 mg/dL (ref 0.2–1.2)
Total Protein: 7.3 g/dL (ref 6.0–8.3)

## 2024-12-12 LAB — CBC
HCT: 35.2 % — ABNORMAL LOW (ref 36.0–46.0)
Hemoglobin: 11.3 g/dL — ABNORMAL LOW (ref 12.0–15.0)
MCHC: 32.2 g/dL (ref 30.0–36.0)
MCV: 72.2 fl — ABNORMAL LOW (ref 78.0–100.0)
Platelets: 277 K/uL (ref 150.0–400.0)
RBC: 4.87 Mil/uL (ref 3.87–5.11)
RDW: 16 % — ABNORMAL HIGH (ref 11.5–15.5)
WBC: 5.3 K/uL (ref 4.0–10.5)

## 2024-12-12 MED ORDER — ONDANSETRON HCL 4 MG PO TABS: 4.0000 mg | ORAL_TABLET | Freq: Four times a day (QID) | ORAL | 2 refills | Status: AC

## 2024-12-12 NOTE — Assessment & Plan Note (Signed)
 EKG done and no changes. Suspect related to GERD given lack of protonix  lately. Given age and risk factors ordered stress test as well.

## 2024-12-12 NOTE — Assessment & Plan Note (Signed)
 No progression since last year and sounds to be normal range.

## 2024-12-12 NOTE — Progress Notes (Signed)
° °  Subjective:   Patient ID: Deborah Barton, female    DOB: [birth year], 78 y.o.   MRN: [id number]  The patient is here for physical. Pertinent topics discussed: Discussed the use of AI scribe software for clinical note transcription with the patient, who gave verbal consent to proceed.  History of Present Illness Deborah Barton is a 78 year old female with hypertension who presents with recent episodes of nausea and head sensations.  She experiences nausea on some mornings upon waking, without a specific cause. She recalls being given a medication for this at the hospital, but she has since run out of it. Occasional indigestion occurs, and she uses pantoprazole  as needed, trying to avoid eating late to prevent symptoms. She is having some episodes of chest heaviness recently and unsure if this is just same timeframe as nausea.   She describes 'slight head burns' or sensations that occur intermittently, which have been present for years. These sensations sometimes correlate with periods of stress or anxiety, particularly related to her son's health issues. She takes duloxetine , which she finds helpful, particularly at night, and she takes half a pill as needed.  She is currently on blood pressure medication, which she sometimes forgets to take consistently.   She inquires about obtaining a handicap placard for assistance.  She discusses significant stress related to her son's diagnosis of multiple sclerosis, which is aggressive and has affected several family members across generations.   EKG: Rate 78, axis normal, interval normal, sinus, V1 p wave inverted likely lead placement, no st or t wave changes, no significant change compared to prior April 2025   PMH, [facility], social history reviewed and updated  Review of Systems  Constitutional: Negative.   HENT: Negative.    Eyes: Negative.   Respiratory:  Positive for chest tightness. Negative for cough and shortness of breath.    Cardiovascular:  Negative for chest pain, palpitations and leg swelling.  Gastrointestinal:  Positive for nausea. Negative for abdominal distention, abdominal pain, constipation, diarrhea and vomiting.  Musculoskeletal:  Positive for myalgias.  Skin: Negative.   Neurological: Negative.   Psychiatric/Behavioral: Negative.      Objective:  Physical Exam Constitutional:      Appearance: She is well-developed.  HENT:     Head: Normocephalic and atraumatic.  Cardiovascular:     Rate and Rhythm: Normal rate and regular rhythm.  Pulmonary:     Effort: Pulmonary effort is normal. No respiratory distress.     Breath sounds: Normal breath sounds. No wheezing or rales.  Abdominal:     General: Bowel sounds are normal. There is no distension.     Palpations: Abdomen is soft.     Tenderness: There is no abdominal tenderness.  Musculoskeletal:     Cervical back: Normal range of motion.  Skin:    General: Skin is warm and dry.  Neurological:     Mental Status: She is alert and oriented to person, place, and time.     Coordination: Coordination normal.     Vitals:   12/12/24 1007  BP: 132/80  Pulse: 79  Temp: 97.7 F (36.5 C)  TempSrc: Oral  SpO2: 98%  Weight: 125 lb 9.6 oz (57 kg)  Height: 5' 1 (1.549 m)    Assessment & Plan:

## 2024-12-12 NOTE — Assessment & Plan Note (Signed)
 Flu shot yearly. Pneumonia complete. Shingrix complete. Tetanus up to date. Colonoscopy up to date. Mammogram up to date, pap smear aged out and dexa due ordered. Counseled about sun safety and mole surveillance. Counseled about the dangers of distracted driving. Given 10 year screening recommendations.

## 2024-12-12 NOTE — Patient Instructions (Signed)
 Your EKG is normal today. We will do the stress test for you to check the heart.

## 2024-12-12 NOTE — Assessment & Plan Note (Signed)
 Taking duloxetine  but not daily (asked to consider daily) and using lorazepam  for sleep. Counseled about coping skills.

## 2024-12-12 NOTE — Assessment & Plan Note (Signed)
 BP at goal checking labs and adjust as needed amlodipine .

## 2024-12-12 NOTE — Assessment & Plan Note (Addendum)
 Not taking protonix  lately and having new chest heaviness and nausea which may be related. Advised to consider 2-4 week course protonix  or daily use. Refilled zofran  for nausea.

## 2024-12-12 NOTE — Assessment & Plan Note (Signed)
 Taking duloxetine  but not consistently and discussed this is preventative and she should consider to take daily.

## 2024-12-14 ENCOUNTER — Ambulatory Visit: Payer: Self-pay | Admitting: Internal Medicine

## 2024-12-14 NOTE — Telephone Encounter (Unsigned)
 Copied from CRM #[id number]. Topic: Clinical - Lab/Test Results >> Dec 14, 2024  2:14 PM Emylou G wrote: Reason for CRM: adv patient of her stable labs

## 2024-12-28 ENCOUNTER — Telehealth (HOSPITAL_COMMUNITY): Payer: Self-pay | Admitting: *Deleted

## 2024-12-28 NOTE — Telephone Encounter (Signed)
 Reminder call given with instructions for upcoming [facility] on 01/03/25 at 1:45

## 2025-01-03 ENCOUNTER — Ambulatory Visit (HOSPITAL_COMMUNITY)
Admission: RE | Admit: 2025-01-03 | Discharge: 2025-01-03 | Disposition: A | Discharge status: Home / Self Care | Source: Ambulatory Visit | Attending: Cardiovascular Disease | Admitting: Cardiovascular Disease

## 2025-01-03 DIAGNOSIS — R0789 Other chest pain: Secondary | ICD-10-CM | POA: Insufficient documentation

## 2025-01-03 LAB — EXERCISE TOLERANCE TEST
Angina Index: 0
Duke Treadmill Score: 7
Estimated workload: 8.7
Exercise duration (min): 7 min
Exercise duration (sec): 1 s
MPHR: 143 {beats}/min
Peak HR: 141 {beats}/min
Percent HR: 98 %
Rest HR: 90 {beats}/min
ST Depression (mm): 0 mm

## 2025-01-15 ENCOUNTER — Telehealth: Payer: Self-pay

## 2025-01-15 NOTE — Telephone Encounter (Signed)
 Copied from CRM [id number]. Topic: Appointments - Scheduling Inquiry for Clinic >> Jan 15, 2025  2:45 PM Macario HERO wrote: Reason for CRM: Patient called said she was supposed to receive a call from office since last month to schedule her bone density test. Patient is requesting a all back.

## 2025-01-18 NOTE — Telephone Encounter (Signed)
 I see order in system nothing needed from me front desk can schedule her

## 2025-01-18 NOTE — Telephone Encounter (Signed)
 Given to [place] @ front desk. Will notify pt once this has been scheduled.

## 2025-10-17 ENCOUNTER — Ambulatory Visit
